# Patient Record
Sex: Female | Born: 1945
Health system: Southern US, Community
[De-identification: ages and names within clinical notes are randomized; demographics above are authoritative.]

## PROBLEM LIST (undated history)

## (undated) DIAGNOSIS — I2510 Atherosclerotic heart disease of native coronary artery without angina pectoris: Secondary | ICD-10-CM

## (undated) DIAGNOSIS — I214 Non-ST elevation (NSTEMI) myocardial infarction: Secondary | ICD-10-CM

## (undated) DIAGNOSIS — M1990 Unspecified osteoarthritis, unspecified site: Secondary | ICD-10-CM

## (undated) DIAGNOSIS — Z955 Presence of coronary angioplasty implant and graft: Secondary | ICD-10-CM

## (undated) DIAGNOSIS — F419 Anxiety disorder, unspecified: Secondary | ICD-10-CM

## (undated) DIAGNOSIS — Z9289 Personal history of other medical treatment: Secondary | ICD-10-CM

## (undated) DIAGNOSIS — M940 Chondrocostal junction syndrome [Tietze]: Secondary | ICD-10-CM

## (undated) DIAGNOSIS — R030 Elevated blood-pressure reading, without diagnosis of hypertension: Secondary | ICD-10-CM

## (undated) DIAGNOSIS — Z9861 Coronary angioplasty status: Secondary | ICD-10-CM

## (undated) DIAGNOSIS — E785 Hyperlipidemia, unspecified: Secondary | ICD-10-CM

## (undated) DIAGNOSIS — Z85038 Personal history of other malignant neoplasm of large intestine: Secondary | ICD-10-CM

## (undated) HISTORY — DX: Personal history of other malignant neoplasm of large intestine: Z85.038

## (undated) HISTORY — DX: Anxiety disorder, unspecified: F41.9

## (undated) HISTORY — DX: Coronary angioplasty status: Z98.61

## (undated) HISTORY — DX: Hyperlipidemia, unspecified: E78.5

## (undated) HISTORY — DX: Personal history of other medical treatment: Z92.89

## (undated) HISTORY — DX: Atherosclerotic heart disease of native coronary artery without angina pectoris: I25.10

## (undated) HISTORY — DX: Elevated blood-pressure reading, without diagnosis of hypertension: R03.0

## (undated) HISTORY — DX: Unspecified osteoarthritis, unspecified site: M19.90

## (undated) HISTORY — DX: Presence of coronary angioplasty implant and graft: Z95.5

## (undated) HISTORY — DX: Non-ST elevation (NSTEMI) myocardial infarction: I21.4

## (undated) HISTORY — DX: Chondrocostal junction syndrome (tietze): M94.0

---

## 2000-12-20 ENCOUNTER — Encounter: Payer: Self-pay | Admitting: Emergency Medicine

## 2000-12-20 ENCOUNTER — Emergency Department (HOSPITAL_COMMUNITY): Admission: EM | Admit: 2000-12-20 | Discharge: 2000-12-20 | Payer: Self-pay | Admitting: Emergency Medicine

## 2000-12-22 ENCOUNTER — Emergency Department (HOSPITAL_COMMUNITY): Admission: EM | Admit: 2000-12-22 | Discharge: 2000-12-22 | Payer: Self-pay | Admitting: *Deleted

## 2000-12-25 ENCOUNTER — Emergency Department (HOSPITAL_COMMUNITY): Admission: EM | Admit: 2000-12-25 | Discharge: 2000-12-25 | Payer: Self-pay | Admitting: Emergency Medicine

## 2000-12-25 ENCOUNTER — Encounter: Payer: Self-pay | Admitting: Emergency Medicine

## 2002-09-18 ENCOUNTER — Encounter: Payer: Self-pay | Admitting: Internal Medicine

## 2002-09-18 ENCOUNTER — Ambulatory Visit (HOSPITAL_COMMUNITY): Admission: RE | Admit: 2002-09-18 | Discharge: 2002-09-18 | Payer: Self-pay | Admitting: Internal Medicine

## 2002-09-22 ENCOUNTER — Ambulatory Visit (HOSPITAL_COMMUNITY): Admission: RE | Admit: 2002-09-22 | Discharge: 2002-09-22 | Payer: Self-pay | Admitting: Internal Medicine

## 2002-09-22 ENCOUNTER — Encounter: Payer: Self-pay | Admitting: Internal Medicine

## 2002-09-28 HISTORY — PX: CHOLECYSTECTOMY: SHX55

## 2002-10-13 ENCOUNTER — Encounter: Payer: Self-pay | Admitting: General Surgery

## 2002-10-17 ENCOUNTER — Ambulatory Visit (HOSPITAL_COMMUNITY): Admission: RE | Admit: 2002-10-17 | Discharge: 2002-10-18 | Payer: Self-pay | Admitting: General Surgery

## 2002-10-17 ENCOUNTER — Encounter (INDEPENDENT_AMBULATORY_CARE_PROVIDER_SITE_OTHER): Payer: Self-pay | Admitting: *Deleted

## 2002-10-17 ENCOUNTER — Encounter: Payer: Self-pay | Admitting: General Surgery

## 2004-04-07 ENCOUNTER — Emergency Department (HOSPITAL_COMMUNITY): Admission: EM | Admit: 2004-04-07 | Discharge: 2004-04-07 | Payer: Self-pay | Admitting: Emergency Medicine

## 2004-05-21 ENCOUNTER — Ambulatory Visit (HOSPITAL_COMMUNITY): Admission: RE | Admit: 2004-05-21 | Discharge: 2004-05-21 | Payer: Self-pay | Admitting: Internal Medicine

## 2004-05-27 ENCOUNTER — Ambulatory Visit (HOSPITAL_COMMUNITY): Admission: RE | Admit: 2004-05-27 | Discharge: 2004-05-27 | Payer: Self-pay | Admitting: Internal Medicine

## 2004-06-23 ENCOUNTER — Emergency Department (HOSPITAL_COMMUNITY): Admission: EM | Admit: 2004-06-23 | Discharge: 2004-06-23 | Payer: Self-pay | Admitting: Emergency Medicine

## 2004-11-21 ENCOUNTER — Ambulatory Visit: Payer: Self-pay | Admitting: Internal Medicine

## 2005-08-25 ENCOUNTER — Encounter: Admission: RE | Admit: 2005-08-25 | Discharge: 2005-08-25 | Payer: Self-pay | Admitting: Family Medicine

## 2005-10-31 IMAGING — CR DG BE W/ CM - WO/W KUB
8 series · 8 of 8 positions shown · non-contrast
Comparison: none

CLINICAL DATA: Completed colonoscopy, but hepatic flexure inadequately evaluated because of stool. 
BARIUM ENEMA 
KUB unremarkable.  There are right upper quadrant surgical clips. 
A full column barium enema was done with palpation.  Special attention was paid to the hepatic flexure.  Numerous spot films were obtained with and without compression, in varying degrees of obliquity.  The hepatic flexure appears normal in all views.  The right colon is redundant.  There is some feces in the tip of the cecum, but no persistent lesions.  No ulcerations or diverticula are noted elsewhere in the colon.  On the postevacuation film, there is some reflux into the terminal ileum which appears normal.  The colonic mucosa looks normal as well.  
IMPRESSION
Normal study ? specifically the hepatic flexure appears normal.

[view not recorded (1 of 8)]
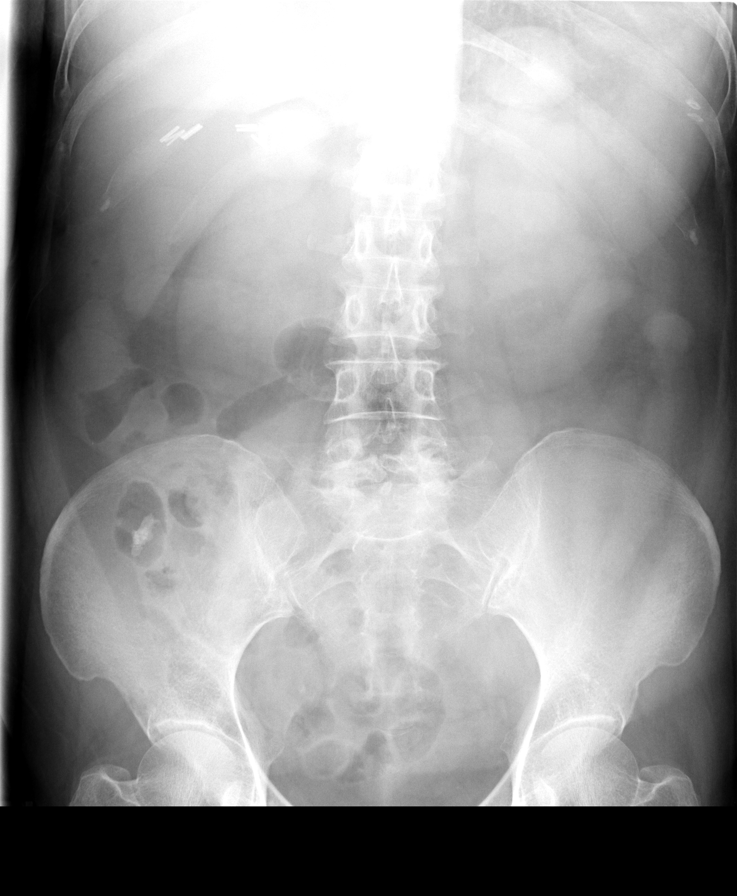

[view not recorded (2 of 8)]
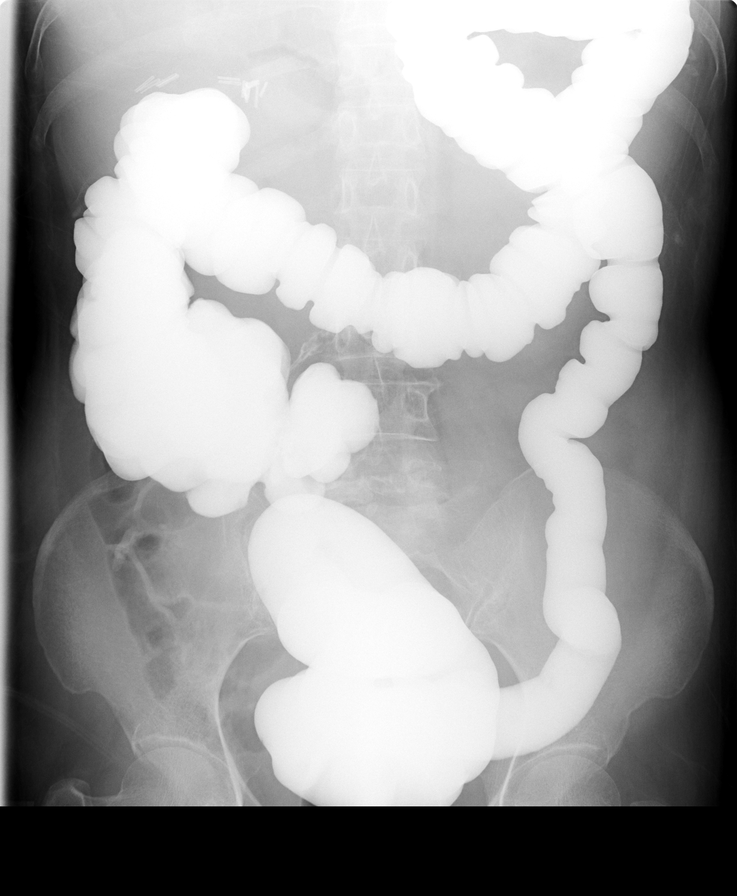

[view not recorded (3 of 8)]
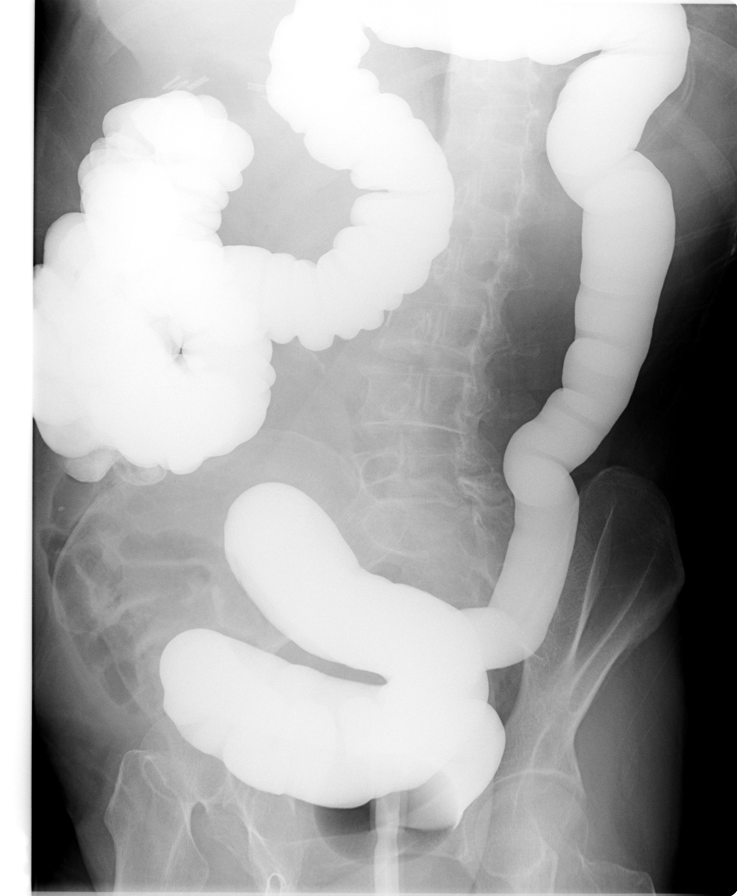

[view not recorded (4 of 8)]
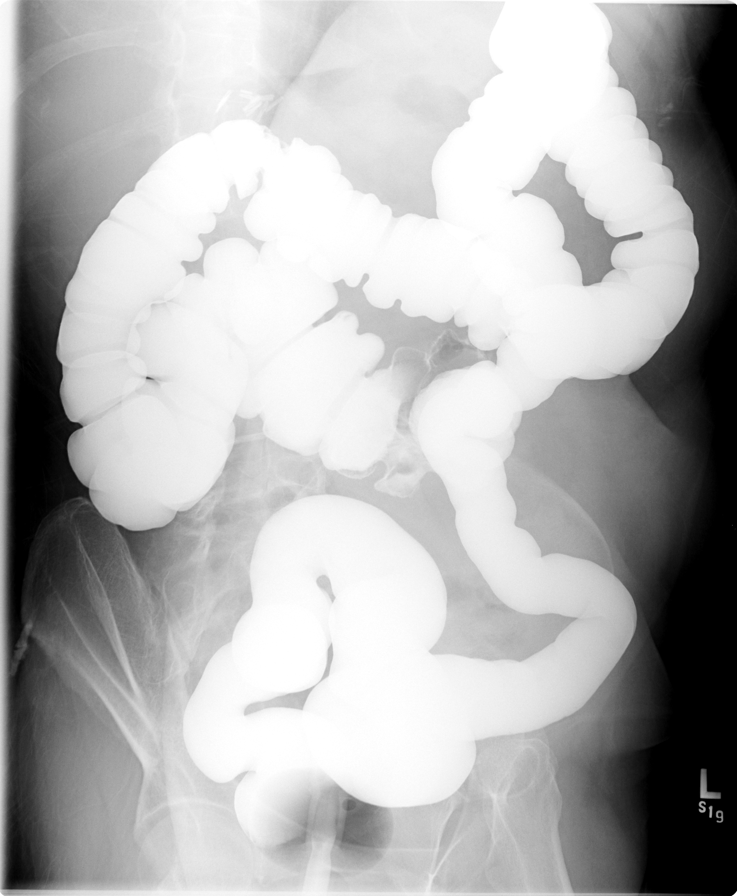

[view not recorded (5 of 8)]
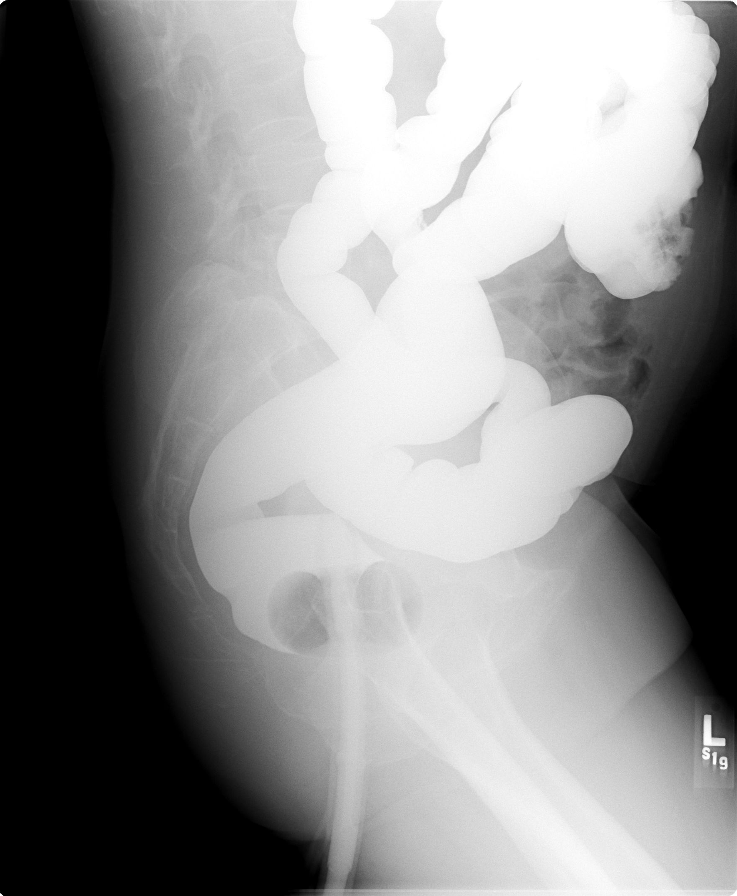

[view not recorded (6 of 8)]
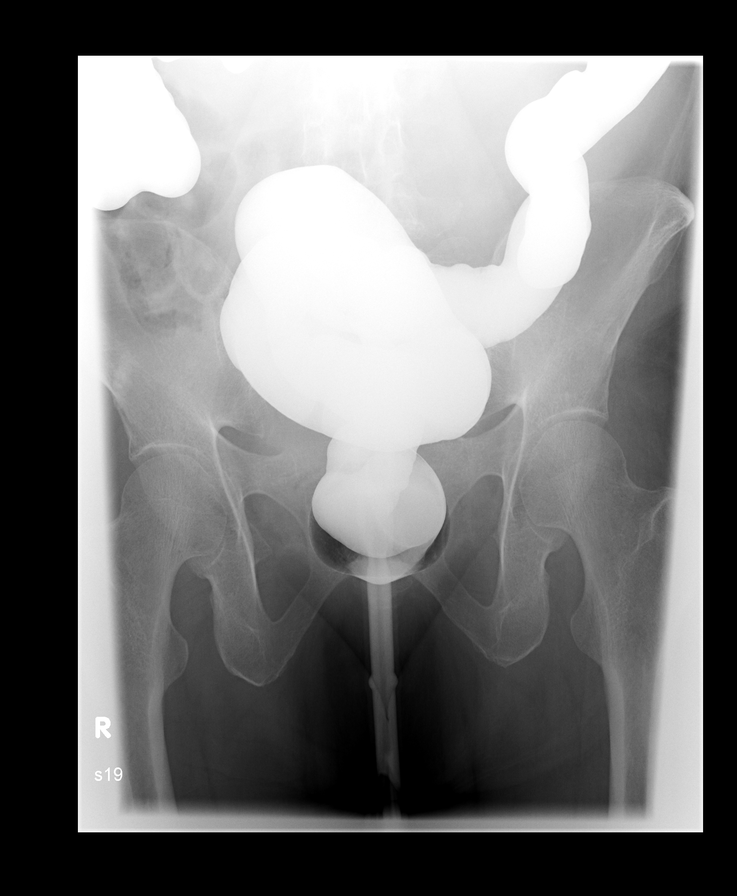

[view not recorded (7 of 8)]
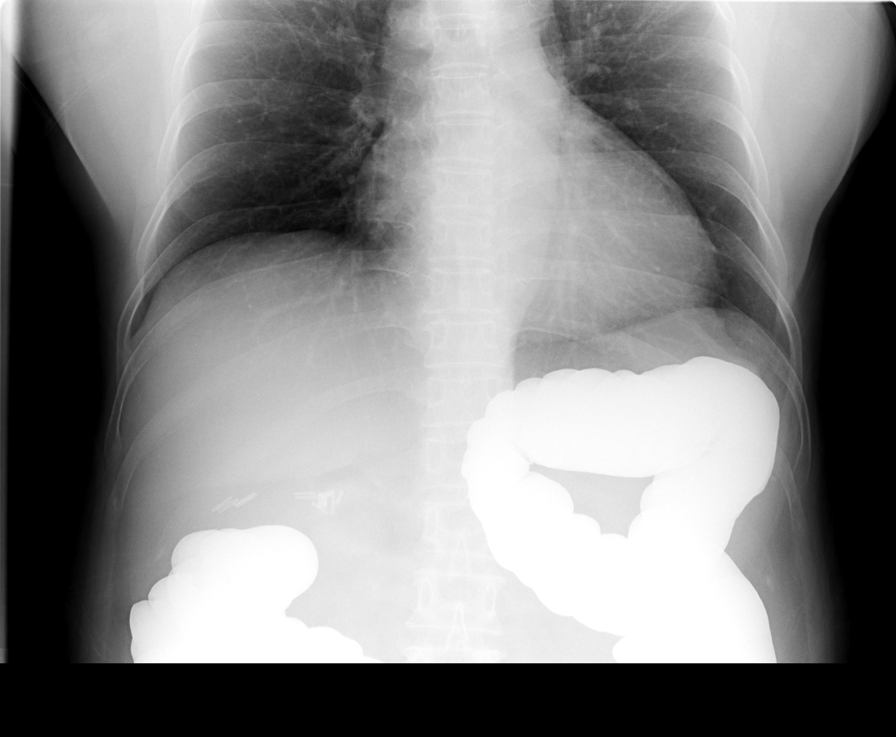

[view not recorded (8 of 8)]
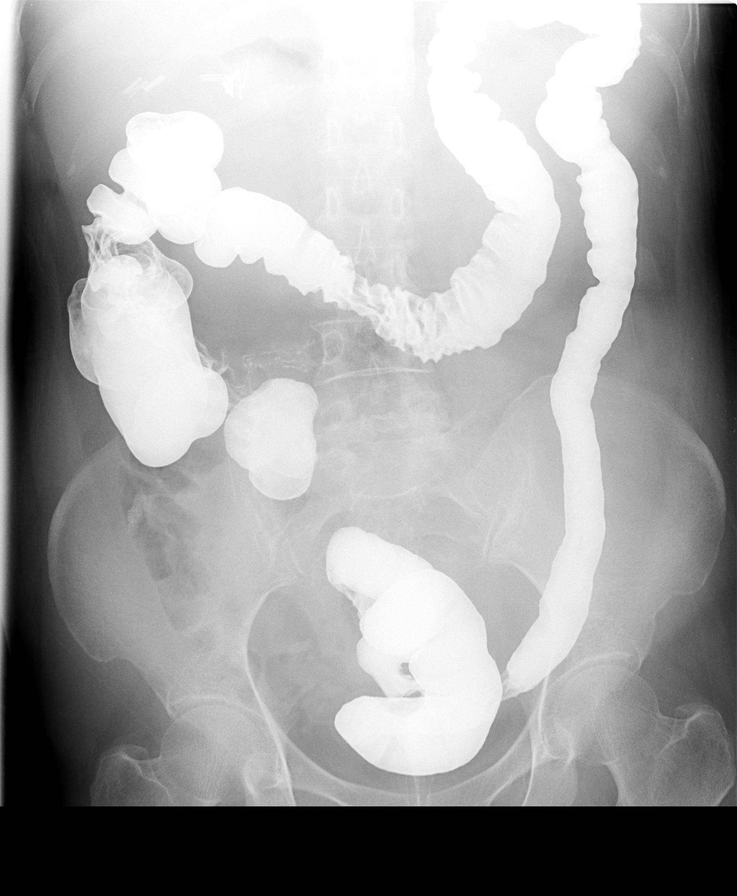

[8 of 8 positions shown; findings below may reference images not displayed]

## 2008-09-28 DIAGNOSIS — Z85038 Personal history of other malignant neoplasm of large intestine: Secondary | ICD-10-CM

## 2008-09-28 HISTORY — DX: Personal history of other malignant neoplasm of large intestine: Z85.038

## 2009-08-02 ENCOUNTER — Encounter: Admission: RE | Admit: 2009-08-02 | Discharge: 2009-08-02 | Payer: Self-pay | Admitting: Internal Medicine

## 2009-08-15 ENCOUNTER — Encounter: Admission: RE | Admit: 2009-08-15 | Discharge: 2009-08-15 | Payer: Self-pay | Admitting: Internal Medicine

## 2010-08-06 ENCOUNTER — Encounter: Admission: RE | Admit: 2010-08-06 | Discharge: 2010-08-06 | Payer: Self-pay | Admitting: Internal Medicine

## 2010-10-19 ENCOUNTER — Encounter: Payer: Self-pay | Admitting: Internal Medicine

## 2010-11-11 ENCOUNTER — Emergency Department (HOSPITAL_COMMUNITY)
Admission: EM | Admit: 2010-11-11 | Discharge: 2010-11-11 | Disposition: A | Discharge status: Home / Self Care | Payer: Medicare Other | Attending: Emergency Medicine | Admitting: Emergency Medicine

## 2010-11-11 ENCOUNTER — Emergency Department (HOSPITAL_COMMUNITY): Payer: Medicare Other

## 2010-11-11 DIAGNOSIS — R0609 Other forms of dyspnea: Secondary | ICD-10-CM | POA: Insufficient documentation

## 2010-11-11 DIAGNOSIS — R0789 Other chest pain: Secondary | ICD-10-CM | POA: Insufficient documentation

## 2010-11-11 DIAGNOSIS — I517 Cardiomegaly: Secondary | ICD-10-CM | POA: Insufficient documentation

## 2010-11-11 DIAGNOSIS — I10 Essential (primary) hypertension: Secondary | ICD-10-CM | POA: Insufficient documentation

## 2010-11-11 DIAGNOSIS — I498 Other specified cardiac arrhythmias: Secondary | ICD-10-CM | POA: Insufficient documentation

## 2010-11-11 DIAGNOSIS — E7800 Pure hypercholesterolemia, unspecified: Secondary | ICD-10-CM | POA: Insufficient documentation

## 2010-11-11 DIAGNOSIS — R0989 Other specified symptoms and signs involving the circulatory and respiratory systems: Secondary | ICD-10-CM | POA: Insufficient documentation

## 2010-11-11 DIAGNOSIS — R609 Edema, unspecified: Secondary | ICD-10-CM | POA: Insufficient documentation

## 2010-11-11 LAB — POCT CARDIAC MARKERS
CKMB, poc: 1.6 ng/mL (ref 1.0–8.0)
Myoglobin, poc: 50.4 ng/mL (ref 12–200)
Troponin i, poc: <0.05 ng/mL (ref 0.00–0.09)

## 2010-11-11 LAB — BASIC METABOLIC PANEL
BUN: 6 mg/dL (ref 6–23)
CO2: 27 mEq/L (ref 19–32)
Calcium: 9.1 mg/dL (ref 8.4–10.5)
Chloride: 109 mEq/L (ref 96–112)
GFR calc Af Amer: >60 mL/min (ref >60)
GFR calc non Af Amer: >60 mL/min (ref >60)
Glucose, Bld: 98 mg/dL (ref 70–99)
Potassium: 3.5 mEq/L (ref 3.5–5.1)
Sodium: 143 mEq/L (ref 135–145)

## 2010-11-11 LAB — CBC
HCT: 35.1 % — ABNORMAL LOW (ref 36.0–46.0)
Hemoglobin: 11.3 g/dL — ABNORMAL LOW (ref 12.0–15.0)
MCH: 21.7 pg — ABNORMAL LOW (ref 26.0–34.0)
MCV: 67.5 fL — ABNORMAL LOW (ref 78.0–100.0)
Platelets: 182 10*3/uL (ref 150–400)
RBC: 5.2 MIL/uL — ABNORMAL HIGH (ref 3.87–5.11)
RDW: 14.5 % (ref 11.5–15.5)
WBC: 9.7 10*3/uL (ref 4.0–10.5)

## 2010-11-11 LAB — DIFFERENTIAL
Basophils Relative: 0 % (ref 0–1)
Eosinophils Relative: 1 % (ref 0–5)
Lymphocytes Relative: 38 % (ref 12–46)
Lymphs Abs: 3.7 10*3/uL (ref 0.7–4.0)
Monocytes Absolute: 0.7 10*3/uL (ref 0.1–1.0)
Monocytes Relative: 7 % (ref 3–12)
Neutro Abs: 5.2 10*3/uL (ref 1.7–7.7)
Neutrophils Relative %: 54 % (ref 43–77)

## 2010-11-11 LAB — BRAIN NATRIURETIC PEPTIDE: Pro B Natriuretic peptide (BNP): 66 pg/mL (ref 0.0–100.0)

## 2011-02-13 NOTE — Op Note (Signed)
NAMEPEYTAN, ANDRINGA [facility]                           ACCOUNT NO.:  [id number]   MEDICAL RECORD NO.:  [id number]                   PATIENT TYPE:  OIB   LOCATION:  2870                                 FACILITY:  [facility]   PHYSICIAN:  Sharlet Salina T. Hoxworth, M.D.          DATE OF BIRTH:  1945-12-04   DATE OF PROCEDURE:  10/17/2002  DATE OF DISCHARGE:                                 OPERATIVE REPORT   PREOPERATIVE DIAGNOSIS:  Cholelithiasis.   POSTOPERATIVE DIAGNOSIS:  Cholelithiasis.   OPERATION PERFORMED:  Laparoscopic cholecystectomy with intraoperative  cholangiogram.   SURGEON:  Sharlet Salina T. Hoxworth, M.D.   ASSISTANT:  Anselm Pancoast. Zachery Dakins, M.D.   ANESTHESIA:  General.   INDICATIONS FOR PROCEDURE:  The patient is a 65 year old [name]  who presents with repeated episodes of right upper quadrant abdominal pain.  She has had a CT scan of the abdomen and abdominal ultrasound, both of which  reveal multiple small gallstones in all bile ducts.  She has a history of  surgery in [place] a number of years ago with a right upper quadrant  incision and she does not know what this was done for.  Laparoscopic  cholecystectomy has been recommended and accepted.  The nature of the  procedure, its indications and risks of bleeding, infection, bile leak, bile  duct injury and possible need for open procedure were discussed and  understood.  She is now brought to the operating room for this procedure.   DESCRIPTION OF PROCEDURE:  The patient was brought to the operating room and  placed in supine position on the operating table and general endotracheal  anesthesia was induced.  She had been given preoperative antibiotics.  The  abdomen was sterilely prepped and draped.  Local anesthesia was used to  infiltrate the trocar sites prior to the incisions.  A 1 cm incision was  made at the umbilicus and dissection carried down to the midline fascia  which was sharply incised for 1 cm and the  peritoneum entered under direct  vision.  Through a mattress suture of 0 Vicryl, a Hasson trocar was placed  and pneumoperitoneum was established.  There were numerous adhesions of  omentum to the anterior abdominal wall in the upper abdomen mainly in the  right upper quadrant.  I placed a 5 mm trocar in the left upper quadrant and  using careful sharp dissection under direct vision, the adhesions were taken  down from the anterior abdominal wall.  The gallbladder was exposed and had  a fair amount of scarring around it.  It was not acutely inflamed.  It was  densely adherent up to the anterior abdominal wall and apparent old drain  site and it appeared that the patient had had a cholecystostomy tube placed.  The gallbladder adhesions were taken down as well as adhesions over the  liver and omental adhesions in the upper abdomen.  At this point we  were  able to place standard 10 mm trocar in the subxiphoid and two 5 mm trocars  in the right upper quadrant.  Fundus of the gallbladder was grasped and  elevated over the liver.  There were adhesions to the duodenum that were  carefully taken down sharply and the duodenum mobilized away from the  infundibulum.  Adhesions and peritoneum were divided along the distal  gallbladder anteriorly and posteriorly and Calot's triangle exposed.  Fibrofatty tissue was stripped off the neck of the gallbladder and what  appeared to be the cystic duct was exposed.  Operative cholangiogram was  then obtained through this which showed a long remnant of the cystic duct  which emptied into a normal common bile duct with flow up into the  intrahepatic ducts and free flow into the duodenum with no filling defects.  Following this, this structure was dissected out further and encircled and  the cystic artery identified running along next to it and these were both  controlled with clips and divided.  The gallbladder was then dissected free  from its bed using hook  cautery.  There was a lot of scarring but the  dissection proceeded nicely.  The posterior artery was controlled with  clips.  This was well up in the gallbladder fossa.  Final attachments were  divided and the gallbladder removed through the umbilicus.  The right upper  quadrant was irrigated and complete hemostasis assured.  Trocars were  removed under direct vision and all CO2 evacuated from the peritoneal  cavity.  Mattress suture was secured at the umbilicus.  Skin incisions were  closed with interrupted subcuticular 4-0 Monocryl and Steri-Strips.  Sponge,  needle and instrument counts were correct.  Dry sterile dressings were  applied.  The patient was taken to the recovery room in good condition.                                                Lorne Skeens. Hoxworth, M.D.    Tory Emerald  D:  10/17/2002  T:  10/17/2002  Job:  [id number]

## 2011-05-21 ENCOUNTER — Other Ambulatory Visit: Payer: Self-pay | Admitting: Internal Medicine

## 2011-05-21 DIAGNOSIS — K219 Gastro-esophageal reflux disease without esophagitis: Secondary | ICD-10-CM

## 2011-05-26 ENCOUNTER — Other Ambulatory Visit: Payer: Medicare Other

## 2011-05-27 ENCOUNTER — Other Ambulatory Visit: Payer: Self-pay | Admitting: Internal Medicine

## 2011-05-27 ENCOUNTER — Ambulatory Visit
Admission: RE | Admit: 2011-05-27 | Discharge: 2011-05-27 | Disposition: A | Discharge status: Home / Self Care | Payer: Medicare Other | Source: Ambulatory Visit | Attending: Internal Medicine | Admitting: Internal Medicine

## 2011-05-27 DIAGNOSIS — K219 Gastro-esophageal reflux disease without esophagitis: Secondary | ICD-10-CM

## 2011-12-31 ENCOUNTER — Other Ambulatory Visit: Payer: Self-pay | Admitting: Cardiovascular Disease

## 2011-12-31 ENCOUNTER — Encounter (HOSPITAL_COMMUNITY): Payer: Self-pay | Admitting: Cardiology

## 2011-12-31 DIAGNOSIS — I10 Essential (primary) hypertension: Secondary | ICD-10-CM | POA: Diagnosis not present

## 2011-12-31 DIAGNOSIS — I200 Unstable angina: Secondary | ICD-10-CM | POA: Diagnosis not present

## 2011-12-31 DIAGNOSIS — I214 Non-ST elevation (NSTEMI) myocardial infarction: Secondary | ICD-10-CM

## 2011-12-31 DIAGNOSIS — I2510 Atherosclerotic heart disease of native coronary artery without angina pectoris: Secondary | ICD-10-CM

## 2011-12-31 DIAGNOSIS — E785 Hyperlipidemia, unspecified: Secondary | ICD-10-CM | POA: Diagnosis present

## 2011-12-31 DIAGNOSIS — R030 Elevated blood-pressure reading, without diagnosis of hypertension: Secondary | ICD-10-CM | POA: Diagnosis present

## 2011-12-31 HISTORY — DX: Atherosclerotic heart disease of native coronary artery without angina pectoris: I25.10

## 2011-12-31 HISTORY — DX: Non-ST elevation (NSTEMI) myocardial infarction: I21.4

## 2011-12-31 NOTE — H&P (Signed)
Tammy Murillo is an 66 y.o. female.   Chief Complaint: chest pain HPI: 66 year old [name] presents to the office today with complaints of 1 month of chest pain, usually with exertion, but it does on occasion awaken her from sleep.  The pain may be sharp, but associated with nausea, sob, and diaphoresis.  Mostly occurs when she walks fast.  She has a history of  Negative nuc. Study Jan. 2013. The episodes and the pain have increased in frequency and intensity over the last few weeks.    EKG today reveals new t wave inversions in V4, V5, V6.  She was having chest discomfort on exam that resolved with NTG sl.    Past Medical History  Diagnosis Date  . HTN (hypertension) 12/31/2011  . Dyslipidemia 12/31/2011    History reviewed. No pertinent past surgical history.  History reviewed. No pertinent family history. Mother with CAD Social History:  does not have a smoking history on file. She does not have any smokeless tobacco history on file. Her alcohol and drug histories not on file. married, 2 children  Allergies: Allergies not on file  NKA  No current facility-administered medications on file as of .   No current outpatient prescriptions on file as of .  OUTPATIENT MEDS: Metoprolol 25 mg BID Zetia 10 mg daily Simvastatin 40 mg daily ADDED 12/31/11:  Imdur 15 mg daily, NTG sl, prn, ASA 325 mg daily.  No results found for this or any previous visit (from the past 48 hour(s)). No results found.  ROS: General:no colds or fevers recently   Skin:no rashes or ulcers HEENT:no blurred vision CV:see HPI PUL:SOB    MS:  No joint pain  Neuro:no syncope Endo:no diabetes or thyroid disease GI:no diarrhea or constipation GU: no hematuria  There were no vitals taken for this visit.  BP 120/70 p-65 r- P T-p   Wt. 132 PE: General:alert and oriented X 3, MAE, no acute distress Skin:W&D brisk capillary refill HEENT:normocephalic, sclera clear Neck:supple, no JVD, no carotid  bruits Heart:S1S2 RRR no murmur, gallup, rub or click Lungs:clear without rales, rhonchi or wheezes Abd:+ BS, soft, non tender Ext:no edema Neuro:alert and oriented X 3, MAE, follows commands.       Assessment/Plan   Patient Active Problem List  Diagnoses  . Angina pectoris, crescendo  . HTN (hypertension)  . Dyslipidemia   PLAN:  Imdur, ASA and NTG and Protonix were added to her medications at office visit.  For cardiac cath tomorrow 01/01/12 to evaluate CAD with new EKG changes with progressive chest pain.  If her pain returns before the cath and not relieved by NTG she is to go to ER.  Her Daughter was the interpreter for this exam. We will need [place] interpreter for the cath.  Cameron Katayama R 12/31/2011, 6:55 PM

## 2012-01-01 ENCOUNTER — Other Ambulatory Visit: Payer: Self-pay

## 2012-01-01 ENCOUNTER — Encounter (HOSPITAL_COMMUNITY)
Admission: RE | Disposition: A | Discharge status: Home / Self Care | Payer: Self-pay | Source: Ambulatory Visit | Attending: Cardiovascular Disease

## 2012-01-01 ENCOUNTER — Ambulatory Visit (HOSPITAL_COMMUNITY): Payer: Medicare Other

## 2012-01-01 ENCOUNTER — Inpatient Hospital Stay (HOSPITAL_COMMUNITY)
Admission: RE | Admit: 2012-01-01 | Discharge: 2012-01-03 | DRG: 247 | Disposition: A | Discharge status: Home / Self Care | Payer: Medicare Other | Source: Ambulatory Visit | Attending: Cardiovascular Disease | Admitting: Cardiovascular Disease

## 2012-01-01 ENCOUNTER — Encounter (HOSPITAL_COMMUNITY): Payer: Self-pay | Admitting: Internal Medicine

## 2012-01-01 DIAGNOSIS — R9431 Abnormal electrocardiogram [ECG] [EKG]: Secondary | ICD-10-CM | POA: Diagnosis not present

## 2012-01-01 DIAGNOSIS — I517 Cardiomegaly: Secondary | ICD-10-CM | POA: Diagnosis not present

## 2012-01-01 DIAGNOSIS — I2510 Atherosclerotic heart disease of native coronary artery without angina pectoris: Secondary | ICD-10-CM | POA: Diagnosis not present

## 2012-01-01 DIAGNOSIS — I10 Essential (primary) hypertension: Secondary | ICD-10-CM | POA: Diagnosis not present

## 2012-01-01 DIAGNOSIS — I214 Non-ST elevation (NSTEMI) myocardial infarction: Principal | ICD-10-CM | POA: Diagnosis present

## 2012-01-01 DIAGNOSIS — Z955 Presence of coronary angioplasty implant and graft: Secondary | ICD-10-CM

## 2012-01-01 DIAGNOSIS — Z9861 Coronary angioplasty status: Secondary | ICD-10-CM | POA: Diagnosis present

## 2012-01-01 DIAGNOSIS — E785 Hyperlipidemia, unspecified: Secondary | ICD-10-CM | POA: Diagnosis not present

## 2012-01-01 DIAGNOSIS — I200 Unstable angina: Secondary | ICD-10-CM | POA: Diagnosis not present

## 2012-01-01 DIAGNOSIS — R079 Chest pain, unspecified: Secondary | ICD-10-CM | POA: Diagnosis not present

## 2012-01-01 DIAGNOSIS — R030 Elevated blood-pressure reading, without diagnosis of hypertension: Secondary | ICD-10-CM | POA: Diagnosis present

## 2012-01-01 HISTORY — DX: Presence of coronary angioplasty implant and graft: Z95.5

## 2012-01-01 HISTORY — PX: PERCUTANEOUS CORONARY STENT INTERVENTION (PCI-S): SHX5485

## 2012-01-01 HISTORY — PX: LEFT HEART CATHETERIZATION WITH CORONARY ANGIOGRAM: SHX5451

## 2012-01-01 LAB — CBC
MCV: 66 fL — ABNORMAL LOW (ref 78.0–100.0)
Platelets: 201 10*3/uL (ref 150–400)
RDW: 15 % (ref 11.5–15.5)
WBC: 7.5 10*3/uL (ref 4.0–10.5)

## 2012-01-01 LAB — COMPREHENSIVE METABOLIC PANEL
AST: 32 U/L (ref 0–37)
Albumin: 3.7 g/dL (ref 3.5–5.2)
Calcium: 9.2 mg/dL (ref 8.4–10.5)
Chloride: 106 mEq/L (ref 96–112)
Creatinine, Ser: 0.45 mg/dL — ABNORMAL LOW (ref 0.50–1.10)

## 2012-01-01 LAB — PROTIME-INR: Prothrombin Time: 12.8 seconds (ref 11.6–15.2)

## 2012-01-01 LAB — POCT ACTIVATED CLOTTING TIME: Activated Clotting Time: 463 seconds

## 2012-01-01 LAB — LIPID PANEL
LDL Cholesterol: 154 mg/dL — ABNORMAL HIGH (ref 0–99)
Triglycerides: 147 mg/dL (ref <150)

## 2012-01-01 SURGERY — LEFT HEART CATHETERIZATION WITH CORONARY ANGIOGRAM
Anesthesia: LOCAL

## 2012-01-01 MED ORDER — FENTANYL CITRATE 0.05 MG/ML IJ SOLN
INTRAMUSCULAR | Status: AC
  Filled 2012-01-01: qty 2

## 2012-01-01 MED ORDER — ONDANSETRON HCL 4 MG/2ML IJ SOLN: 4.0000 mg | Freq: Four times a day (QID) | INTRAMUSCULAR | Status: DC | PRN

## 2012-01-01 MED ORDER — TRAMADOL HCL 50 MG PO TABS
50.0000 mg | ORAL_TABLET | Freq: Four times a day (QID) | ORAL | Status: DC | PRN
  Administered 2012-01-01 – 2012-01-02 (×2): 50 mg via ORAL
  Filled 2012-01-01: qty 1

## 2012-01-01 MED ORDER — NITROGLYCERIN 0.4 MG SL SUBL: 0.4000 mg | SUBLINGUAL_TABLET | SUBLINGUAL | Status: DC | PRN

## 2012-01-01 MED ORDER — NITROGLYCERIN 0.4 MG SL SUBL
0.4000 mg | SUBLINGUAL_TABLET | SUBLINGUAL | Status: DC | PRN
  Administered 2012-01-01: 0.4 mg via SUBLINGUAL
  Filled 2012-01-01: qty 25

## 2012-01-01 MED ORDER — SODIUM CHLORIDE 0.9 % IJ SOLN: 3.0000 mL | Freq: Two times a day (BID) | INTRAMUSCULAR | Status: DC

## 2012-01-01 MED ORDER — TICAGRELOR 90 MG PO TABS
ORAL_TABLET | ORAL | Status: AC
  Administered 2012-01-01: 90 mg via ORAL
  Filled 2012-01-01: qty 2

## 2012-01-01 MED ORDER — ASPIRIN 81 MG PO CHEW
324.0000 mg | CHEWABLE_TABLET | ORAL | Status: AC
  Administered 2012-01-01: 324 mg via ORAL
  Filled 2012-01-01: qty 4

## 2012-01-01 MED ORDER — SODIUM CHLORIDE 0.9 % IJ SOLN: 3.0000 mL | INTRAMUSCULAR | Status: DC | PRN

## 2012-01-01 MED ORDER — EZETIMIBE 10 MG PO TABS
10.0000 mg | ORAL_TABLET | Freq: Every day | ORAL | Status: DC
  Administered 2012-01-01 – 2012-01-02 (×2): 10 mg via ORAL
  Filled 2012-01-01 (×3): qty 1

## 2012-01-01 MED ORDER — SIMVASTATIN 40 MG PO TABS
40.0000 mg | ORAL_TABLET | Freq: Every evening | ORAL | Status: DC
  Administered 2012-01-01: 40 mg via ORAL
  Filled 2012-01-01 (×2): qty 1

## 2012-01-01 MED ORDER — LIDOCAINE HCL (PF) 1 % IJ SOLN
INTRAMUSCULAR | Status: AC
  Filled 2012-01-01: qty 30

## 2012-01-01 MED ORDER — SODIUM CHLORIDE 0.9 % IV SOLN
INTRAVENOUS | Status: DC
  Administered 2012-01-01 (×2): via INTRAVENOUS

## 2012-01-01 MED ORDER — NITROGLYCERIN 0.2 MG/ML ON CALL CATH LAB
INTRAVENOUS | Status: AC
  Filled 2012-01-01: qty 1

## 2012-01-01 MED ORDER — NITROGLYCERIN 0.4 MG SL SUBL
SUBLINGUAL_TABLET | SUBLINGUAL | Status: AC
  Filled 2012-01-01: qty 25

## 2012-01-01 MED ORDER — ISOSORBIDE MONONITRATE ER 30 MG PO TB24
30.0000 mg | ORAL_TABLET | Freq: Every day | ORAL | Status: DC
  Administered 2012-01-01: 30 mg via ORAL
  Filled 2012-01-01 (×2): qty 1

## 2012-01-01 MED ORDER — DIAZEPAM 5 MG PO TABS
5.0000 mg | ORAL_TABLET | ORAL | Status: AC
  Administered 2012-01-01: 5 mg via ORAL
  Filled 2012-01-01: qty 1

## 2012-01-01 MED ORDER — METOPROLOL TARTRATE 25 MG PO TABS
25.0000 mg | ORAL_TABLET | Freq: Two times a day (BID) | ORAL | Status: DC
  Administered 2012-01-01 – 2012-01-03 (×4): 25 mg via ORAL
  Filled 2012-01-01 (×6): qty 1

## 2012-01-01 MED ORDER — HEPARIN (PORCINE) IN NACL 2-0.9 UNIT/ML-% IJ SOLN
INTRAMUSCULAR | Status: AC
  Filled 2012-01-01: qty 2000

## 2012-01-01 MED ORDER — ACETAMINOPHEN 325 MG PO TABS
650.0000 mg | ORAL_TABLET | ORAL | Status: DC | PRN
  Administered 2012-01-02 (×2): 650 mg via ORAL
  Filled 2012-01-01 (×2): qty 2

## 2012-01-01 MED ORDER — MIDAZOLAM HCL 2 MG/2ML IJ SOLN
INTRAMUSCULAR | Status: AC
  Filled 2012-01-01: qty 2

## 2012-01-01 MED ORDER — PANTOPRAZOLE SODIUM 40 MG PO TBEC
40.0000 mg | DELAYED_RELEASE_TABLET | Freq: Every day | ORAL | Status: DC
  Administered 2012-01-01 – 2012-01-02 (×2): 40 mg via ORAL
  Filled 2012-01-01 (×2): qty 1

## 2012-01-01 MED ORDER — ASPIRIN EC 81 MG PO TBEC
81.0000 mg | DELAYED_RELEASE_TABLET | Freq: Every day | ORAL | Status: DC
  Administered 2012-01-02: 81 mg via ORAL
  Filled 2012-01-01 (×2): qty 1

## 2012-01-01 MED ORDER — SODIUM CHLORIDE 0.9 % IV SOLN: INTRAVENOUS | Status: DC

## 2012-01-01 MED ORDER — SODIUM CHLORIDE 0.9 % IV SOLN: 250.0000 mL | INTRAVENOUS | Status: DC | PRN

## 2012-01-01 MED ORDER — TICAGRELOR 90 MG PO TABS
90.0000 mg | ORAL_TABLET | Freq: Two times a day (BID) | ORAL | Status: DC
  Administered 2012-01-01 – 2012-01-02 (×3): 90 mg via ORAL
  Filled 2012-01-01 (×6): qty 1

## 2012-01-01 MED ORDER — BIVALIRUDIN 250 MG IV SOLR
INTRAVENOUS | Status: AC
  Filled 2012-01-01: qty 250

## 2012-01-01 NOTE — Op Note (Signed)
[facility]     CARDIAC CATHETERIZATION REPORT  Tammy Murillo   [id number] Sep 25, 1946  Performing Cardiologist: Chrystie Nose Primary Physician: No primary provider on file. Primary Cardiologist:  Dr. Herbie Baltimore  Procedures Performed:  Left Heart Catheterization via 5 Fr right femoral artery access  Left Ventriculography, (RAO/LAO) 15 ml/sec for 28 ml total contrast  Native Coronary Angiography  Indication(s): Unstable angina, abnormal EKG suggestive of ischemia  History: 66 y.o. female who is [place] with a history of dyslipidemia, hypertension, colon CA in 2010 and osteoarthritis. She has been having chest pain on an off for several years and had a stress test in 09/2010 which showed normal perfusion and an EF of 75%. This was exercise with 5 mets achieved. Yesterday, she presented to the office with typical anginal pain which was increasing in severity and TWI's concerning for ischemia. This morning she had an episode of chest pain (5/10), substernal, in the short stay area which improved with nitroglycerin. She is referred for urgent cardiac catheterization.  Consent: The procedure with Risks/Benefits/Alternatives and Indications was reviewed with the patient (and family), with a [place] translator present.  All questions were answered.    Risks / Complications include, but not limited to: Death, MI, CVA/TIA, VF/VT (with defibrillation), Bradycardia (need for temporary pacer placement), contrast induced nephropath, bleeding / bruising / hematoma / pseudoaneurysm, vascular or coronary injury (with possible emergent CT or Vascular Surgery), adverse medication reactions, infection.    The patient (and family) voice understanding and agree to proceed.    Risks of procedure as well as the alternatives and risks of each were explained to the (patient/caregiver).  Consent for procedure obtained. Consent for signed by MD and patient with RN witness -- placed on  chart.  Procedure: The patient was brought to the 2nd Floor [facility] in the fasting state and prepped and draped in the usual sterile fashion for (Right groin) access.   Sterile technique was used including antiseptics, cap, gloves, gown, hand hygiene, mask and sheet.  Skin prep: Chlorhexidine;  Time Out: Verified patient identification, verified procedure, site/side was marked, verified correct patient position, special equipment/implants available, medications/allergies/relevent history reviewed, required imaging and test results available.  Performed  The right femoral head was identified using tactile and fluoroscopic technique.  The right groin was anesthetized with 1% subcutaneous Lidocaine.  The right Common Femoral Artery was accessed using the Modified Seldinger Technique with placement of a antimicrobial bonded/coated single lumen (5 Fr) sheath was placed using the Seldinger technique.  The sheath was aspirated and flushed.  A 5 Fr JL4 Catheter was advanced of over a  Standard J wire into the ascending Aorta.  The catheter was used to engage the left coronary artery.  Multiple cineangiographic views of the left coronary artery system(s) were performed. A 5 Fr JR4 Catheter was advanced of over a Safety J wire into the ascending Aorta.  The catheter was used to engage the right coronary artery.  Multiple cineangiographic views of the right coronary artery system was performed. This catheter was then exchanged over the standard J wire for an angled Pigtail catheter that was advanced across the Aortic Valve.  LV hemodynamics were measured and Left Ventriculography was performed.  LV hemodynamics were then re-sampled, and the catheter was pulled back across the Aortic Valve for measurement of "pull-back" gradient.  The catheter was removed completely out of the body.  The sheath was then exchanged over the safety J wire  for a 75F sheath to allow percutaneous  intervention.  Please see Dr. Landry Dyke note for PCI details.  Medications:  Premedication: 5 mg  Valium  Sedation:  1 mg IV Versed, 25 IV mcg Fentanyl  Contrast:  55 cc Omnipaque   Hemodynamics:  Central Aortic Pressure / Mean Aortic Pressure: 121/64  LV Pressure / LV End diastolic Pressure:  8  Left Ventriculography:  EF: 55-60%  Wall Motion: No focal wall motion abnormalities  Coronary Angiographic Data:  Left Main:  No angiographically significant stenosis  Left Anterior Descending (LAD):  There is a 99% proximal (technically mid) LAD stenosis after a high diagonal branch.  There is a sequential 60% lesion which appears eccentric approximately 20 mm distal to the 1st lesion. The LAD coarses around the apex.  1st diagonal (D1):  Large vessel with high LAD takeoff, however after the crux. There is no angiographic stenosis.  2nd diagonal (D2):  Small vessel, distal takeoff, no significant disease.  Circumflex (LCx):  No angiographic stenoses  1st obtuse marginal:  Large branch without significant diseasse  2nd obtuse marginal:  Large branch without significant disease   Right Coronary Artery: Dominant. There is a distal bifurcation of the PLB/PDA  right ventricle branch of right coronary artery: Small caliber, tortuous vessel  posterior descending artery: Larger branch without significant stenosis  posterior lateral branch:  Small branch, no significant stenosis  Impression: 1.  Severe proximal LAD stenosis (99%) with a sequential 60-70% stenosis about 20 mm distal to the first lesion - I suspect the entire segment is diseased. 2.  LVEF 55-60% 3.  LVEDP = 8 mmHg  Plan: 1.  Discussed with Dr. Tresa Endo - will plan PCI to the proximal LAD. 2.  Load with Brillinta 180 mg - plan angiomax. 3.  History of colon CA without recurrence. She is likely a good DES candidate.  The case and results was discussed with the patient (and family) through the [place] translator. The  case and results was not discussed with the patient's PCP. The case and results was not discussed with the patient's Cardiologist.  Time Spend Directly with Patient:  60 minutes  Chrystie Nose, MD, [facility] Attending Cardiologist [facility]  Rachell Druckenmiller C 01/01/2012, 1:08 PM

## 2012-01-01 NOTE — Cardiovascular Report (Signed)
NAMECOURTENAY, HIRTH                   ACCOUNT NO.:  [id number]  MEDICAL RECORD NO.:  [id number]  LOCATION:  2504                         FACILITY:  [facility]  PHYSICIAN:  Nicki Guadalajara, M.D.     DATE OF BIRTH:  [birth year]  DATE OF PROCEDURE:  01/01/2012 DATE OF DISCHARGE:                           CARDIAC CATHETERIZATION   INDICATIONS:  Ms. Jorden Mahl is a 66 year old [name] who has a history of hypertension, hyperlipidemia, and remote colon cancer.  In January 2012, a nuclear perfusion study apparently was normal. Recently, the patient has developed episodes of chest pain.  She was seen in the office yesterday and due to worrisome symptoms of angina, she presented for diagnostic cardiac catheterization today.  Diagnostic catheterization was done by Dr. Italy Hilty.  The patient was found to have high-grade proximal to mid LAD segmental stenoses.  She is now referred for percutaneous coronary intervention.  PROCEDURE:  The patient was already in the catheterization laboratory from her diagnostic procedure.  A 6-French sheath had been already placed in her right femoral artery.  The patient received an additional 1 mg of Versed plus 25 mcg of fentanyl.  Angiomax bolus plus infusion was administered.  Oral Brilinta 180 mg was given for antiplatelet therapy.  ACT was documented to be therapeutic.  A 6-French XB LAD 3.5 guide was used.  A Prowater wire was advanced down the LAD and was able to cross multiple lesions into the distal vessel.  The LAD had a proximal diagonal vessel and then had mild narrowing and in the region of the proximal septal artery, it was 95% eccentrically stenosed.  This was followed by another area of 70% narrowing with an eccentric shelf after the second septal perforating artery followed by another area of 60% narrowing immediately thereafter.  In the mid LAD, there was also another area of 50% narrowing.  An initial dilatation was done of the proximal 2  sites using a 2.5 x 20 mm Emerge balloon dilatation catheter. Multiple image views were obtained and it was felt that in order to cover the second lesion, which had an eccentric shelf, the stent would be right near the third lesion and therefore the decision was made to cover the third lesion.  Consequently, a 2.75 x 38 mm PROMUS Element DES- stent was then successfully deployed to cover all 3 proximal to mid lesions.  This was dilated x2 at 12 and 13 atmospheres.  A 3.0 x 20 mm noncompliant Quantum balloon was used for post stent dilatation up to 3.0 mm tapering distally to 2.95 mm.  SCOUT angiography confirmed the excellent angiographic result with all 3 lesions being reduced to 0%. There was no change in the fourth lesion which was not intervened upon and remained 50% narrowed.  There was brisk TIMI-3 flow.  There was no evidence for dissection.  IMPRESSION:  Successful percutaneous coronary artery intervention of the left anterior descending artery involving segmental stenoses of 95% followed by 70% eccentric stenosis with shelf and 60% narrowing with all 3 lesions being covered by a 2.75 x 38 mm PROMUS Element DES-stent, post- dilated to 3.0 mm, tapering to 2.95 mm done with bivalirudin/oral  Brilinta 180 mg, and IC nitroglycerin.  All lesions were reduced to 0%. There was no change in the further mid-LAD lesion of 50%, which was not intervened upon.          ______________________________ Nicki Guadalajara, M.D.     TK/MEDQ  D:  01/01/2012  T:  01/01/2012  Job:  [id number]  cc:   Landry Corporal, MD Italy Hilty, MD Darcella Cheshire, M.D.

## 2012-01-01 NOTE — Brief Op Note (Signed)
01/01/2012  2:40 PM   PCI:  LAD  PATIENT:  Tammy Murillo  66 y.o. female  PRE-OPERATIVE DIAGNOSIS:  unstable angina   PROCEDURE:  Procedure(s) (LRB): LEFT HEART CATHETERIZATION WITH CORONARY ANGIOGRAM (N/A) PERCUTANEOUS CORONARY STENT INTERVENTION (PCI-S) ()  SURGEON:  Surgeon(s) and Role:    * Chrystie Nose, MD cath    * Lennette Bihari, MD - PCI  Full PCI note dictated;  See diagram,  DICTATION # [id number], [id number]  Successful PCI of 3 proximal to mid LAD lesions of  eccentric 95%, 70%, 60% being reduced to 0 with PTCA/stenting with 2.75 x38 mm DES promus Element stent post dilated to 3.0 to 2.95 taper.    Lennette Bihari, MD, [facility] 01/01/2012 2:41 PM

## 2012-01-01 NOTE — H&P (Signed)
Pt. Seen and examined. Agree with the NP/PA-C note as written.  Plan for LHC today.  Chrystie Nose, MD, [facility] Attending Cardiologist [facility]

## 2012-01-01 NOTE — Progress Notes (Signed)
C/O CHEST PAIN AND LAURA INGOLD,NP NOTIFIED AND O2 STARTED AT 2L/MIN VIA NASAL CANNULA AND EKG NOTED AND NTG GIVEN AND LAURA IN TO SEE AND STATES COMPLETE RELIEF FROM CHEST PAIN AFTER NTG

## 2012-01-01 NOTE — Progress Notes (Signed)
Called to Short Stay C due to pt's chest pain.  EKG was stable, 1 Ntg with resolving pain.

## 2012-01-01 NOTE — Interval H&P Note (Signed)
History and Physical Interval Note:  01/01/2012 12:22 PM  Tammy Murillo  has presented today for surgery, with the diagnosis of unstable angina  The various methods of treatment have been discussed with the patient and family. After consideration of risks, benefits and other options for treatment, the patient has consented to  Procedure(s) (LRB): LEFT HEART CATHETERIZATION WITH CORONARY ANGIOGRAM (N/A) as a surgical intervention .  The patients' history has been reviewed, patient examined, no change in status, stable for surgery.  I have reviewed the patients' chart and labs.  Questions were answered to the patient's satisfaction.     Tammy Murillo  The patient was explained in detail the risks and benefits of cardiac catheterization in the office yesterday. Consent was not signed before the patient reached the cath lab today. I again reviewed the risks and benefits with the patient through the translator. She is having continued chest pain and EKG changes which probably represent ischemia.  She is agreeable for cardiac catheterization today.  Tammy Nose, MD, [facility] Attending Cardiologist [facility]

## 2012-01-02 ENCOUNTER — Other Ambulatory Visit: Payer: Self-pay

## 2012-01-02 DIAGNOSIS — E785 Hyperlipidemia, unspecified: Secondary | ICD-10-CM | POA: Diagnosis present

## 2012-01-02 DIAGNOSIS — I2510 Atherosclerotic heart disease of native coronary artery without angina pectoris: Secondary | ICD-10-CM | POA: Diagnosis not present

## 2012-01-02 DIAGNOSIS — R9431 Abnormal electrocardiogram [ECG] [EKG]: Secondary | ICD-10-CM | POA: Diagnosis not present

## 2012-01-02 DIAGNOSIS — I214 Non-ST elevation (NSTEMI) myocardial infarction: Secondary | ICD-10-CM | POA: Diagnosis not present

## 2012-01-02 DIAGNOSIS — I10 Essential (primary) hypertension: Secondary | ICD-10-CM | POA: Diagnosis present

## 2012-01-02 DIAGNOSIS — R079 Chest pain, unspecified: Secondary | ICD-10-CM | POA: Diagnosis not present

## 2012-01-02 DIAGNOSIS — I200 Unstable angina: Secondary | ICD-10-CM | POA: Diagnosis not present

## 2012-01-02 LAB — CARDIAC PANEL(CRET KIN+CKTOT+MB+TROPI)
CK, MB: 7.8 ng/mL (ref 0.3–4.0)
Total CK: 139 U/L (ref 7–177)
Troponin I: 1.9 ng/mL (ref <0.30)

## 2012-01-02 LAB — CBC
Hemoglobin: 10.3 g/dL — ABNORMAL LOW (ref 12.0–15.0)
MCH: 21.2 pg — ABNORMAL LOW (ref 26.0–34.0)
MCHC: 32.2 g/dL (ref 30.0–36.0)
Platelets: 217 10*3/uL (ref 150–400)
RBC: 4.85 MIL/uL (ref 3.87–5.11)

## 2012-01-02 LAB — BASIC METABOLIC PANEL
CO2: 24 mEq/L (ref 19–32)
Calcium: 8.4 mg/dL (ref 8.4–10.5)
GFR calc non Af Amer: >90 mL/min (ref >90)
Potassium: 3.8 mEq/L (ref 3.5–5.1)
Sodium: 140 mEq/L (ref 135–145)

## 2012-01-02 MED ORDER — RAMIPRIL 1.25 MG PO CAPS: 1.2500 mg | ORAL_CAPSULE | Freq: Every day | ORAL | Status: DC

## 2012-01-02 MED ORDER — ISOSORBIDE MONONITRATE 15 MG HALF TABLET
15.0000 mg | ORAL_TABLET | Freq: Every day | ORAL | Status: DC
  Filled 2012-01-02: qty 1

## 2012-01-02 MED ORDER — ACETAMINOPHEN 325 MG PO TABS: 650.0000 mg | ORAL_TABLET | ORAL | Status: AC | PRN

## 2012-01-02 MED ORDER — RAMIPRIL 1.25 MG PO CAPS
1.2500 mg | ORAL_CAPSULE | Freq: Every day | ORAL | Status: DC
  Administered 2012-01-02 – 2012-01-03 (×2): 1.25 mg via ORAL
  Filled 2012-01-02 (×2): qty 1

## 2012-01-02 MED ORDER — TICAGRELOR 90 MG PO TABS: 90.0000 mg | ORAL_TABLET | Freq: Two times a day (BID) | ORAL | Status: DC

## 2012-01-02 MED ORDER — ASPIRIN 81 MG PO TBEC: 81.0000 mg | DELAYED_RELEASE_TABLET | Freq: Every day | ORAL | Status: AC

## 2012-01-02 MED ORDER — ISOSORBIDE MONONITRATE ER 30 MG PO TB24: 15.0000 mg | ORAL_TABLET | Freq: Every day | ORAL | Status: DC

## 2012-01-02 MED ORDER — ATORVASTATIN CALCIUM 40 MG PO TABS: 40.0000 mg | ORAL_TABLET | Freq: Every day | ORAL | Status: DC

## 2012-01-02 MED ORDER — ATORVASTATIN CALCIUM 40 MG PO TABS
40.0000 mg | ORAL_TABLET | Freq: Every day | ORAL | Status: DC
  Administered 2012-01-02: 40 mg via ORAL
  Filled 2012-01-02 (×2): qty 1

## 2012-01-02 NOTE — Progress Notes (Signed)
CRITICAL VALUE ALERT  Critical value received:  ckmb 7.8 Trop 1.90  Date of notification:  01/02/12  Time of notification:  1130  Critical value read back:yes  Nurse who received alert:  Donna Christen, RN  MD notified (1st page):  Nada Boozer, NP  Time of first page:  1145  MD notified (2nd page):  Time of second page:  Responding MD:  Nada Boozer, NP  Time MD responded:  [phone]

## 2012-01-02 NOTE — Progress Notes (Signed)
   CARE MANAGEMENT NOTE 01/02/2012  Patient:  [facility]   Account Number:  [id number]  Date Initiated:  01/02/2012  Documentation initiated by:  [facility]  Subjective/Objective Assessment:   S/P stent to LAD covering 3 lesions     Action/Plan:   Anticipated DC Date:  01/03/2012   Anticipated DC Plan:  HOME/SELF CARE      DC Planning Services  CM consult  Medication Assistance      Choice offered to / List presented to:             Status of service:  In process, will continue to follow Medicare Important Message given?   (If response is "NO", the following Medicare IM given date fields will be blank) Date Medicare IM given:   Date Additional Medicare IM given:    Discharge Disposition:  HOME/SELF CARE  Per UR Regulation:    If discussed at Long Length of Stay Meetings, dates discussed:    Comments:  01/02/2012 1700 Pt provided free trial card, Brilinta. Pt will need a prescription for free 30 day supply of Brilinta. Does not qualify for copay assistance due to Medicare/Medicaid coverage. Pt can pick up meds from [facility] on [address]. Meds are available at pharmacy. Pt's copay will be $3.50 per NCM, Tomi Bamberger RN CM note on 4/5 to MD. Isidoro Donning RN CCM Case Mgmt phone [phone]

## 2012-01-02 NOTE — Progress Notes (Signed)
CARDIAC REHAB PHASE I   PRE:  Rate/Rhythm: 65 SR  BP:  Supine: 101/43  Sitting:   Standing:    SaO2:   MODE:  Ambulation: 680 ft   POST:  Rate/Rhythem: 79 SR  BP:  Supine:   Sitting: 109/55   Standing:    SaO2:  0945-1130 Tolerated ambulation well without c/o of cp or SOB. VS stable Completed discharge education with pt,daaghter and interpreter from telephonic interpreting [phone]. Pt and daughter voices understanding. Pt declines Outpt. CRP due to no transportation.  Beatrix Fetters

## 2012-01-02 NOTE — Progress Notes (Addendum)
66 year old [name] presents to the office 12/31/11 with complaints of 1 month of chest pain, usually with exertion, but it does on occasion awaken her from sleep. The pain may be sharp, but associated with nausea, sob, and diaphoresis. Mostly occurs when she walks fast. She has a history of Negative nuc. Study Jan. 2013. The episodes and the pain have increased in frequency and intensity over the last few weeks.  EKG 12/31/11 revealed new t wave inversions in V4, V5, V6. She was having chest discomfort on exam that resolved with NTG sl. When she arrived to short stay also with chest pain resolved with NTG sl.    Subjective: No complaints  Objective: Vital signs in last 24 hours: Temp:  [97.7 F (36.5 C)-99.3 F (37.4 C)] 97.9 F (36.6 C) (04/06 0800) Pulse Rate:  [55-78] 67  (04/06 0800) Resp:  [10-18] 18  (04/06 0800) BP: (98-148)/(55-89) 98/57 mmHg (04/06 0800) SpO2:  [97 %-100 %] 97 % (04/06 0800) Weight:  [59.875 kg (132 lb)-63.9 kg (140 lb 14 oz)] 63.9 kg (140 lb 14 oz) (04/06 0536) Weight change:  Last BM Date: 12/29/11 Intake/Output from previous day: +2057 04/05 0701 - 04/06 0700 In: 2457.5 [P.O.:660; I.V.:1797.5] Out: 400 [Urine:400] Intake/Output this shift:    PE: General: No Chest pain No JVD Heart: RR Lungs:no wheezing Abd:BS+ Ecchymosis R groin; no hematoma Ext:no edema Neuro:nonfocal   Lab Results:  Basename 01/02/12 0500 01/01/12 1025  WBC 9.3 7.5  HGB 10.3* 11.6*  HCT 32.0* 35.3*  PLT 217 201   BMET  Basename 01/02/12 0500 01/01/12 1025  NA 140 142  K 3.8 3.6  CL 108 106  CO2 24 26  GLUCOSE 113* 100*  BUN 12 15  CREATININE 0.47* 0.45*  CALCIUM 8.4 9.2   No results found for this basename: TROPONINI:2,CK,MB:2 in the last 72 hours  Lab Results  Component Value Date   CHOL 222* 01/01/2012   HDL 39* 01/01/2012   LDLCALC 154* 01/01/2012   TRIG 147 01/01/2012   CHOLHDL 5.7 01/01/2012   No results found for this basename: HGBA1C     Lab  Results  Component Value Date   TSH 2.844 01/01/2012    Hepatic Function Panel  Basename 01/01/12 1025  PROT 7.1  ALBUMIN 3.7  AST 32  ALT 32  ALKPHOS 63  BILITOT 0.3  BILIDIR --  IBILI --    Basename 01/01/12 1025  CHOL 222*   No results found for this basename: PROTIME in the last 72 hours    EKG: Orders placed during the hospital encounter of 01/01/12  . EKG 12-LEAD  . EKG 12-LEAD    Studies/Results: Dg Chest Port 1 View  01/01/2012  *RADIOLOGY REPORT*  Clinical Data: Chest pain.  PORTABLE CHEST - 1 VIEW  Comparison: 11/11/2010  Findings: Mild cardiomegaly with slight tortuosity of the thoracic aorta.  Pulmonary vascularity is normal.  Slight chronic peribronchial thickening. Lungs are otherwise clear.  No acute osseous abnormality.  IMPRESSION: Chronic peribronchial thickening.  Chronic cardiomegaly.  No acute abnormalities.  Original Report Authenticated By: Gwynn Burly, M.D.    Medications: I have reviewed the patient's current medications.    Marland Kitchen aspirin  324 mg Oral Pre-Cath  . aspirin EC  81 mg Oral Daily  . bivalirudin      . diazepam  5 mg Oral On Call  . ezetimibe  10 mg Oral Daily  . fentaNYL      . heparin      .  isosorbide mononitrate  30 mg Oral Daily  . lidocaine      . metoprolol tartrate  25 mg Oral BID  . midazolam      . nitroGLYCERIN      . nitroGLYCERIN      . pantoprazole  40 mg Oral Daily  . simvastatin  40 mg Oral QPM  . Ticagrelor  90 mg Oral BID  . DISCONTD: sodium chloride  3 mL Intravenous Q12H   Assessment/Plan: Patient Active Problem List  Diagnoses  . Angina pectoris, crescendo  . HTN (hypertension)  . Dyslipidemia  . CAD (coronary artery disease)  . S/P coronary artery stent placement to the LAD  01/01/12, with Promus DES, X 3 in the LAD    PLAN: ambulate, ? Discharge today. Lipids on current meds not controlling lipids was on zocor and zetia.  BP soft.  Low grade fever at 2300 ? Continue IMDUR added at office visit for  angina  EKG this am with t wave inversions in inf leads.  LOS: 1 day   INGOLD,LAURA R 01/02/2012, 8:58 AM   Patient seen and examined. Agree with assessment and plan. No recurrent chest pain. S/P stent to LAD covering 3 lesions. Will add low dose ACE-I. Change Zocor to lipitor 40 mg.  With T wave changes inferiorly, check enzymes.   Lennette Bihari, MD, [facility] 01/02/2012 9:34 AM

## 2012-01-03 ENCOUNTER — Encounter (HOSPITAL_COMMUNITY): Payer: Self-pay | Admitting: Cardiology

## 2012-01-03 DIAGNOSIS — I214 Non-ST elevation (NSTEMI) myocardial infarction: Secondary | ICD-10-CM | POA: Diagnosis present

## 2012-01-03 LAB — BASIC METABOLIC PANEL
Calcium: 9.5 mg/dL (ref 8.4–10.5)
GFR calc non Af Amer: >90 mL/min (ref >90)
Glucose, Bld: 117 mg/dL — ABNORMAL HIGH (ref 70–99)
Potassium: 3.5 mEq/L (ref 3.5–5.1)
Sodium: 141 mEq/L (ref 135–145)

## 2012-01-03 LAB — TROPONIN I: Troponin I: 0.89 ng/mL (ref <0.30)

## 2012-01-03 MED ORDER — POTASSIUM CHLORIDE CRYS ER 20 MEQ PO TBCR
20.0000 meq | EXTENDED_RELEASE_TABLET | Freq: Once | ORAL | Status: AC
  Administered 2012-01-03: 20 meq via ORAL
  Filled 2012-01-03: qty 1

## 2012-01-03 NOTE — Progress Notes (Signed)
Pt. Discharged 01/03/2012  9:56 AM Discharge instructions reviewed with patient/family. Patient/family verbalized understanding. All Rx's given. Questions answered as needed. Pt. Discharged to home with family/self.  Tammy Murillo

## 2012-01-03 NOTE — Progress Notes (Signed)
Subjective:  Yesterday discharge held for positive cardiac enzymes.  May have been elevated prior to procedure with her angina.  Objective: Vital signs in last 24 hours: Temp:  [98 F (36.7 C)-98.7 F (37.1 C)] 98.1 F (36.7 C) (04/07 0424) Pulse Rate:  [52-64] 59  (04/07 0424) Resp:  [18-19] 18  (04/07 0424) BP: (102-123)/(54-65) 121/54 mmHg (04/07 0424) SpO2:  [95 %-98 %] 96 % (04/07 0424) Weight change:  Last BM Date: 01/02/12 Intake/Output from previous day: +360 04/06 0701 - 04/07 0700 In: 360 [P.O.:360] Out: -  Intake/Output this shift:    PE: General:alert, no chest pain-her pre hospital symptoms are resolved,, Daughter assisted with interpreting. Heart:S1S2 RRR Lungs:clear without rales rhonchi or wheezes Abd:+ BS, soft, non tender Ext:no edema--+ecchymosis at cath site, no hematoma    Lab Results:  Basename 01/02/12 0500 01/01/12 1025  WBC 9.3 7.5  HGB 10.3* 11.6*  HCT 32.0* 35.3*  PLT 217 201   BMET  Basename 01/03/12 0620 01/02/12 0500  NA 141 140  K 3.5 3.8  CL 104 108  CO2 26 24  GLUCOSE 117* 113*  BUN 13 12  CREATININE 0.49* 0.47*  CALCIUM 9.5 8.4    Basename 01/03/12 0620 01/02/12 1947  TROPONINI 0.89* 1.31*    Lab Results  Component Value Date   CHOL 222* 01/01/2012   HDL 39* 01/01/2012   LDLCALC 154* 01/01/2012   TRIG 147 01/01/2012   CHOLHDL 5.7 01/01/2012   No results found for this basename: HGBA1C     Lab Results  Component Value Date   TSH 2.844 01/01/2012    Hepatic Function Panel  Basename 01/01/12 1025  PROT 7.1  ALBUMIN 3.7  AST 32  ALT 32  ALKPHOS 63  BILITOT 0.3  BILIDIR --  IBILI --    Basename 01/01/12 1025  CHOL 222*   No results found for this basename: PROTIME in the last 72 hours    EKG: Orders placed during the hospital encounter of 01/01/12  . EKG 12-LEAD  . EKG 12-LEAD    Studies/Results: Dg Chest Port 1 View  01/01/2012  *RADIOLOGY REPORT*  Clinical Data: Chest pain.  PORTABLE CHEST - 1 VIEW   Comparison: 11/11/2010  Findings: Mild cardiomegaly with slight tortuosity of the thoracic aorta.  Pulmonary vascularity is normal.  Slight chronic peribronchial thickening. Lungs are otherwise clear.  No acute osseous abnormality.  IMPRESSION: Chronic peribronchial thickening.  Chronic cardiomegaly.  No acute abnormalities.  Original Report Authenticated By: Gwynn Burly, M.D.    Medications: I have reviewed the patient's current medications.    Marland Kitchen aspirin EC  81 mg Oral Daily  . atorvastatin  40 mg Oral q1800  . ezetimibe  10 mg Oral Daily  . isosorbide mononitrate  15 mg Oral Daily  . metoprolol tartrate  25 mg Oral BID  . pantoprazole  40 mg Oral Daily  . ramipril  1.25 mg Oral Daily  . Ticagrelor  90 mg Oral BID  . DISCONTD: isosorbide mononitrate  30 mg Oral Daily  . DISCONTD: simvastatin  40 mg Oral QPM    Assessment/Plan: Patient Active Problem List  Diagnoses  . Angina pectoris, crescendo  . HTN (hypertension)  . Dyslipidemia  . CAD (coronary artery disease)  . S/P coronary artery stent placement to the LAD  01/01/12, with Promus DES, X 3 in the LAD    PLAN: ? Discharge today?   We adjusted statin, continued Imdur.  ? NSTEMI  LOS: 2 days  INGOLD,LAURA R 01/03/2012, 9:09 AM   I have seen and examined the patient along with [facility] R, NP.  I have reviewed the chart, notes and new data.  I agree with NP's note.  Key new complaints: angina completely resolved, even when walking fast Key examination changes: small access site ecchymosis Key new findings / data: cTnI already falling, suggesting injury occurred pre-procedure.  PLAN: DC home w ASA, Brilinta (discussed critical importance of uninterrupted dual antiplatelet Rx), betablocker, statin and nitrates. May not tolerate ACEi due to low BP. Avoid NSAIDs or use very sparingly. NSTEMI present on admission probably.  Thurmon Fair, MD, [facility] [phone] 01/03/2012, 9:45  AM

## 2012-01-03 NOTE — Discharge Instructions (Signed)
Groin Site Care Refer to this sheet in the next few weeks. These instructions provide you with information on caring for yourself after your procedure. Your caregiver may also give you more specific instructions. Your treatment has been planned according to current medical practices, but problems sometimes occur. Call your caregiver if you have any problems or questions after your procedure. HOME CARE INSTRUCTIONS  You may shower 24 hours after the procedure. Remove the bandage (dressing) and gently wash the site with plain soap and water. Gently pat the site dry.   Do not apply powder or lotion to the site.   Do not sit in a bathtub, swimming pool, or whirlpool for 5 to 7 days.   No bending, squatting, or lifting anything over 10 pounds (4.5 kg) as directed by your caregiver.   Inspect the site at least twice daily.   Do not drive home if you are discharged the same day of the procedure. Have someone else drive you.   You may drive 24 hours after the procedure unless otherwise instructed by your caregiver.  What to expect:  Any bruising will usually fade within 1 to 2 weeks.   Blood that collects in the tissue (hematoma) may be painful to the touch. It should usually decrease in size and tenderness within 1 to 2 weeks.  SEEK IMMEDIATE MEDICAL CARE IF:  You have unusual pain at the groin site or down the affected leg.   You have redness, warmth, swelling, or pain at the groin site.   You have drainage (other than a small amount of blood on the dressing).   You have chills.   You have a fever or persistent symptoms for more than 72 hours.   You have a fever and your symptoms suddenly get worse.   Your leg becomes pale, cool, tingly, or numb.   You have heavy bleeding from the site. Hold pressure on the site.  Document Released: 10/17/2010 Document Revised: 09/03/2011 Document Reviewed: 10/17/2010 [facility] Patient Information 2012 [place], [place].  Call [facility] if any bleeding, swelling or drainage at cath site.  May shower, no tub baths for 48 hours for groin sticks.   Take 1 NTG, under your tongue, while sitting.  If no relief of pain may repeat NTG, one tab every 5 minutes up to 3 tablets total over 15 minutes.  If no relief CALL 911.  If you have dizziness/lightheadness  while taking NTG, stop taking and call 911.        Heart healthy diet  No lifting for 3 days  No driving for 3 days  Call the office if chest pain occurs. Brilinta is a very important medication DO NOT STOP

## 2012-01-03 NOTE — Discharge Summary (Signed)
Physician Discharge Summary  Patient ID: Tammy Murillo MRN: [id number] DOB/AGE: [birth year] 66 y.o.  Admit date: 01/01/2012 Discharge date: 01/03/2012  Discharge Diagnoses:  Active Problems:  Angina pectoris, crescendo  Dyslipidemia  CAD (coronary artery disease)  S/P coronary artery stent placement to the LAD  01/01/12, with Promus DES, X 3 in the LAD   NSTEMI (non-ST elevated myocardial infarction)present on admit.  HTN (hypertension)   Discharged Condition: good  Procedures:01/01/2012 [place] left heart cath by Dr. Rennis Golden 01/01/2012 PCI with proximal to mid LAD lesions and placement of drug-eluting Promus stent By Dr. Rachelle Hora Course: 66 year old Tammy Murillo presents to the office 12/31/11 with complaints of 1 month of chest pain, usually with exertion, but it does on occasion awaken her from sleep. The pain may be sharp, but associated with nausea, sob, and diaphoresis. Mostly occurs when she walks fast. She has a history of Negative nuc. Study Jan. 2013. The episodes and the pain have increased in frequency and intensity over the last few weeks.  EKG today reveals new t wave inversions in V4, V5, V6. She was having chest discomfort on exam that resolved with NTG sl.   Patient was started on Imdur and Protonix and given nitroglycerin sublingual prescription and plans were made for cardiac catheterization the next day. After arrival to the short stay unit she again had recurrent chest pain resolved with sublingual nitroglycerin EKG was without change.  She underwent cardiac catheterization with results as below Left Ventriculography:  EF: 55-60%  Wall Motion: No focal wall motion abnormalities  Coronary Angiographic Data:  Left Main: No angiographically significant stenosis  Left Anterior Descending (LAD): There is a 99% proximal (technically mid) LAD stenosis after a high diagonal branch. There is a sequential 60% lesion which appears eccentric approximately 20 mm distal to the  1st lesion. The LAD coarses around the apex.  1st diagonal (D1): Large vessel with high LAD takeoff, however after the crux. There is no angiographic stenosis.  2nd diagonal (D2): Small vessel, distal takeoff, no significant disease.  Circumflex (LCx): No angiographic stenoses  1st obtuse marginal: Large branch without significant diseasse  2nd obtuse marginal: Large branch without significant disease  Right Coronary Artery: Dominant. There is a distal bifurcation of the PLB/PDA  right ventricle branch of right coronary artery: Small caliber, tortuous vessel  posterior descending artery: Larger branch without significant stenosis  posterior lateral branch: Small branch, no significant stenosis And then underwent PCI.  Successful PCI of 3 proximal to mid LAD lesions of eccentric 95%, 70%, 60% being reduced to 0 with PTCA/stenting with 2.75 x38 mm DES promus Element stent post dilated to 3.0 to 2.95 taper.   The next morning the patient was ambulating without any complaints at all she felt much better. Cardiac enzymes were positive with elevated troponin it was felt safer to keep her one more day.  Patient was having non-ST elevation MI on admission with recurrent chest pain.  By 01/03/2012 she was ambulating she had no complaints and was ready for discharge. She was seen and evaluated by Dr. Royann Shivers felt she was stable.  She received 30 day free card for Brilinta and will followup as an outpatient with Dr. Tresa Endo.  Patient speaks minimal English her daughter translated for her several times for that procedure she had a translator brought in which assisted [place] with the procedure.  During the hospitalization we adjusted her statin as her LDL was elevated on admission despite being on medications as  an outpatient.  Consults: None  Significant Diagnostic Studies:  At discharge sodium 141 potassium 3.5 chloride 104 CO2 26 BUN 13 creatinine 0.49 calcium 9.5 glucose 117. She was given 20 of potassium  prior to discharge by mouth.  Cardiac enzymes troponin peak of 1.90 with declined thereafter at discharge 0.89. Peak CK 139 peak MB 7.8  Total cholesterol 222 triglycerides 147 HDL 39 LDL 154.  Hemoglobin 10.3 hematocrit 32 WBC 9.3 platelet 217 TSH 2.844 Portable chest x-ray on admission:   IMPRESSION:  Chronic peribronchial thickening. Chronic cardiomegaly. No acute  abnormalities  Discharge Exam: Blood pressure 121/54, pulse 59, temperature 98.1 F (36.7 C), temperature source Oral, resp. rate 18, height 4\' 11"  (1.499 m), weight 63.9 kg (140 lb 14 oz), SpO2 96.00%.   General:alert, no chest pain-her pre hospital symptoms are resolved,, Daughter assisted with interpreting.  Heart:S1S2 RRR  Lungs:clear without rales rhonchi or wheezes  Abd:+ BS, soft, non tender  Ext:no edema--+ecchymosis at cath site, no hematoma  Disposition: 01-Home or Self Care   Medication List  As of 01/03/2012  4:18 PM   STOP taking these medications         diclofenac 75 MG EC tablet      diclofenac sodium 1 % Gel      simvastatin 40 MG tablet         TAKE these medications         acetaminophen 325 MG tablet   Commonly known as: TYLENOL   Take 2 tablets (650 mg total) by mouth every 4 (four) hours as needed.      aspirin 81 MG EC tablet   Take 1 tablet (81 mg total) by mouth daily.      atorvastatin 40 MG tablet   Commonly known as: LIPITOR   Take 1 tablet (40 mg total) by mouth daily at 6 PM.      esomeprazole 40 MG capsule   Commonly known as: NEXIUM   Take 40 mg by mouth daily before breakfast.      ezetimibe 10 MG tablet   Commonly known as: ZETIA   Take 10 mg by mouth daily.      isosorbide mononitrate 30 MG 24 hr tablet   Commonly known as: IMDUR   Take 0.5 tablets (15 mg total) by mouth daily.      metoprolol tartrate 25 MG tablet   Commonly known as: LOPRESSOR   Take 25 mg by mouth 2 (two) times daily.      nitroGLYCERIN 0.4 MG SL tablet   Commonly known as: NITROSTAT     Place 0.4 mg under the tongue every 5 (five) minutes as needed. For chest pain      ramipril 1.25 MG capsule   Commonly known as: ALTACE   Take 1 capsule (1.25 mg total) by mouth daily.      Ticagrelor 90 MG Tabs tablet   Commonly known as: BRILINTA   Take 1 tablet (90 mg total) by mouth 2 (two) times daily.      traMADol 50 MG tablet   Commonly known as: ULTRAM   Take 50 mg by mouth every 6 (six) hours as needed. For pain           Follow-up Information    Follow up with HARDING,DAVID W, MD. (Our office will call you with date and time of appointment)    Contact information:   [facility] [address] [place] [address] [phone]  Discharge instructions: Call [facility] if any bleeding, swelling or drainage at cath site.  May shower, no tub baths for 48 hours for groin sticks.   Take 1 NTG, under your tongue, while sitting.  If no relief of pain may repeat NTG, one tab every 5 minutes up to 3 tablets total over 15 minutes.  If no relief CALL 911.  If you have dizziness/lightheadness  while taking NTG, stop taking and call 911.        Heart healthy diet  No lifting for 3 days  No driving for 3 days  Call the office if chest pain occurs. Brilinta is a very important medication DO NOT STOP  Signed: Anitta Tenny R 01/03/2012, 4:18 PM

## 2012-01-13 DIAGNOSIS — I10 Essential (primary) hypertension: Secondary | ICD-10-CM | POA: Diagnosis not present

## 2012-01-13 DIAGNOSIS — I2510 Atherosclerotic heart disease of native coronary artery without angina pectoris: Secondary | ICD-10-CM | POA: Diagnosis not present

## 2012-01-13 DIAGNOSIS — E782 Mixed hyperlipidemia: Secondary | ICD-10-CM | POA: Diagnosis not present

## 2012-03-14 DIAGNOSIS — Z9861 Coronary angioplasty status: Secondary | ICD-10-CM | POA: Diagnosis not present

## 2012-03-14 DIAGNOSIS — I2510 Atherosclerotic heart disease of native coronary artery without angina pectoris: Secondary | ICD-10-CM | POA: Diagnosis not present

## 2012-03-14 DIAGNOSIS — E782 Mixed hyperlipidemia: Secondary | ICD-10-CM | POA: Diagnosis not present

## 2012-03-28 DIAGNOSIS — Z79899 Other long term (current) drug therapy: Secondary | ICD-10-CM | POA: Diagnosis not present

## 2012-03-28 DIAGNOSIS — E782 Mixed hyperlipidemia: Secondary | ICD-10-CM | POA: Diagnosis not present

## 2012-06-07 DIAGNOSIS — E782 Mixed hyperlipidemia: Secondary | ICD-10-CM | POA: Diagnosis not present

## 2012-06-07 DIAGNOSIS — Z9861 Coronary angioplasty status: Secondary | ICD-10-CM | POA: Diagnosis not present

## 2012-06-07 DIAGNOSIS — I2510 Atherosclerotic heart disease of native coronary artery without angina pectoris: Secondary | ICD-10-CM | POA: Diagnosis not present

## 2012-12-06 DIAGNOSIS — I10 Essential (primary) hypertension: Secondary | ICD-10-CM | POA: Diagnosis not present

## 2012-12-06 DIAGNOSIS — E782 Mixed hyperlipidemia: Secondary | ICD-10-CM | POA: Diagnosis not present

## 2012-12-06 DIAGNOSIS — Z79899 Other long term (current) drug therapy: Secondary | ICD-10-CM | POA: Diagnosis not present

## 2012-12-06 DIAGNOSIS — Z9861 Coronary angioplasty status: Secondary | ICD-10-CM | POA: Diagnosis not present

## 2012-12-27 DIAGNOSIS — Z9289 Personal history of other medical treatment: Secondary | ICD-10-CM

## 2012-12-27 HISTORY — DX: Personal history of other medical treatment: Z92.89

## 2012-12-27 HISTORY — PX: TRANSTHORACIC ECHOCARDIOGRAM: SHX275

## 2013-01-24 ENCOUNTER — Ambulatory Visit (HOSPITAL_COMMUNITY)
Admission: RE | Admit: 2013-01-24 | Discharge: 2013-01-24 | Disposition: A | Discharge status: Home / Self Care | Payer: Medicare Other | Source: Ambulatory Visit | Attending: Cardiology | Admitting: Cardiology

## 2013-01-24 ENCOUNTER — Other Ambulatory Visit (HOSPITAL_COMMUNITY): Payer: Self-pay | Admitting: Cardiology

## 2013-01-24 DIAGNOSIS — I219 Acute myocardial infarction, unspecified: Secondary | ICD-10-CM | POA: Insufficient documentation

## 2013-01-24 DIAGNOSIS — I2510 Atherosclerotic heart disease of native coronary artery without angina pectoris: Secondary | ICD-10-CM | POA: Diagnosis not present

## 2013-01-24 DIAGNOSIS — I252 Old myocardial infarction: Secondary | ICD-10-CM | POA: Diagnosis not present

## 2013-01-24 NOTE — Progress Notes (Signed)
2D Echo Performed 01/24/2013    Dominico Rod, RCS  

## 2013-02-14 ENCOUNTER — Telehealth: Payer: Self-pay | Admitting: *Deleted

## 2013-02-14 NOTE — Telephone Encounter (Signed)
Called no answer.   Result of echocardiogram mild abnormalities per Dr Herbie Baltimore  Looks good f/u appointment in 6 months already in Epic.

## 2013-02-23 NOTE — Telephone Encounter (Signed)
Call no answer 

## 2013-02-27 ENCOUNTER — Other Ambulatory Visit (HOSPITAL_COMMUNITY): Payer: Self-pay | Admitting: Cardiology

## 2013-02-28 NOTE — Telephone Encounter (Signed)
Sent refill

## 2013-03-01 DIAGNOSIS — E039 Hypothyroidism, unspecified: Secondary | ICD-10-CM | POA: Diagnosis not present

## 2013-03-01 DIAGNOSIS — M25579 Pain in unspecified ankle and joints of unspecified foot: Secondary | ICD-10-CM | POA: Diagnosis not present

## 2013-03-01 DIAGNOSIS — J3089 Other allergic rhinitis: Secondary | ICD-10-CM | POA: Diagnosis not present

## 2013-03-01 DIAGNOSIS — D649 Anemia, unspecified: Secondary | ICD-10-CM | POA: Diagnosis not present

## 2013-03-01 DIAGNOSIS — J029 Acute pharyngitis, unspecified: Secondary | ICD-10-CM | POA: Diagnosis not present

## 2013-03-01 DIAGNOSIS — Q245 Malformation of coronary vessels: Secondary | ICD-10-CM | POA: Diagnosis not present

## 2013-03-14 ENCOUNTER — Other Ambulatory Visit: Payer: Self-pay | Admitting: *Deleted

## 2013-03-14 MED ORDER — ISOSORBIDE MONONITRATE ER 30 MG PO TB24: 15.0000 mg | ORAL_TABLET | Freq: Every day | ORAL | Status: DC

## 2013-04-14 ENCOUNTER — Encounter: Payer: Self-pay | Admitting: *Deleted

## 2013-04-14 ENCOUNTER — Telehealth: Payer: Self-pay | Admitting: *Deleted

## 2013-04-14 NOTE — Telephone Encounter (Signed)
Sent letter.Concerning echo results

## 2013-05-23 ENCOUNTER — Encounter: Payer: Self-pay | Admitting: Cardiology

## 2013-05-26 ENCOUNTER — Encounter: Payer: Self-pay | Admitting: Cardiology

## 2013-05-29 HISTORY — PX: NM MYOVIEW LTD: HXRAD82

## 2013-05-30 ENCOUNTER — Encounter: Payer: Self-pay | Admitting: Cardiology

## 2013-05-30 ENCOUNTER — Ambulatory Visit (INDEPENDENT_AMBULATORY_CARE_PROVIDER_SITE_OTHER): Payer: Medicare Other | Admitting: Cardiology

## 2013-05-30 VITALS — BP 112/82 | HR 53 | Ht 60.0 in | Wt 132.7 lb

## 2013-05-30 DIAGNOSIS — E785 Hyperlipidemia, unspecified: Secondary | ICD-10-CM

## 2013-05-30 DIAGNOSIS — R030 Elevated blood-pressure reading, without diagnosis of hypertension: Secondary | ICD-10-CM

## 2013-05-30 DIAGNOSIS — I209 Angina pectoris, unspecified: Secondary | ICD-10-CM | POA: Diagnosis not present

## 2013-05-30 DIAGNOSIS — I2510 Atherosclerotic heart disease of native coronary artery without angina pectoris: Secondary | ICD-10-CM | POA: Diagnosis not present

## 2013-05-30 DIAGNOSIS — Z9861 Coronary angioplasty status: Secondary | ICD-10-CM | POA: Diagnosis not present

## 2013-05-30 DIAGNOSIS — Z955 Presence of coronary angioplasty implant and graft: Secondary | ICD-10-CM

## 2013-05-30 MED ORDER — ISOSORBIDE MONONITRATE ER 30 MG PO TB24: 30.0000 mg | ORAL_TABLET | Freq: Every day | ORAL | Status: DC

## 2013-05-30 NOTE — Patient Instructions (Addendum)
Do not take Ramipril anymore.  Start Imdur (Isorsobide mn 30 mg) take 1 tablet at bedtime.   Your physician has requested that you have a lexiscan myoview. For further information please visit [email or web address] Please follow instruction sheet, as given.  Your physician wants you to follow-up in 6 month Dr Herbie Baltimore- if test is normal. You will receive a reminder letter in the mail two months in advance. If you don't receive a letter, please call our office to schedule the follow-up appointment.

## 2013-05-31 ENCOUNTER — Encounter: Payer: Self-pay | Admitting: Cardiology

## 2013-05-31 DIAGNOSIS — I209 Angina pectoris, unspecified: Secondary | ICD-10-CM | POA: Insufficient documentation

## 2013-05-31 NOTE — Assessment & Plan Note (Addendum)
Am somewhat concerned that she is now having exertional dyspnea and chest discomfort. She has had a history of some atypical sounding symptoms. This is a little bit more concerning. The only thing is unusual is that it doesn't always have it with exertion. I therefore once before with her, unless her non-STEMI lesion was indeed acute and the other 60 and 70% lesions were not flow limiting. And that these episodes are simply exertional, I would say is the least class II if not even class III angina.  Plan: LexiScan Myoview to evaluate for myocardial ischemia.  We'll discontinue her ACE inhibitor, and initiate Imdur 30 mg daily. I don't think she is taking either one of these, unless with her back on the Imdur since her symptoms are alleviated by nitroglycerin. I'll see her back after the stress test.

## 2013-05-31 NOTE — Assessment & Plan Note (Signed)
She is already on aspirin and Brilinta. She seemed to tolerate the beta blocker, which I will continue for the other branch. I am stopping the ACE inhibitor because of her borderline blood pressures and the plan to restart the Imdur. She is also on statin plus Zetia

## 2013-05-31 NOTE — Progress Notes (Signed)
Patient ID: Tammy Murillo, female   DOB: 27-Dec-1945, 67 y.o.   MRN: [id number] PCP: Dorrene German, MD  Clinic Note: Chief Complaint  Patient presents with  . 6 month visit    result echo,  sometime chest pian center of chest,sometime sob,edema ,numbness legs and sometime in hands, she become worried ,angry  chest discomfort   HPI: Tammy Murillo is a 67 y.o. [place]  female with a PMH of non-STEMI with severe LAD lesion treated with a DES stent. This was roughly one year after a totally normal nuclear stress test performed for chest discomfort which sounded much like costochondritis.  She presents today for routine followup, after being last seen in March. She had an echocardiogram performed at that visit which was relatively normal as noted below..  Interval History: Today she sees her doing relatively well overall. She does however that continued to note these chest discomfort episodes. She also gets short of breath with exertion and discomfort in her chest with exertion but is also associated with increased stress or anxiety. When we got a little deeper it does seem that this really only occurs when there is a distress or concerning situation in the home. When things are happy when she is doing fine feeling well with no social stressors, she doesn't seem to have these symptoms. These episodes do not not happen at rest which is something that she had in the past. It is both dyspnea and chest discomfort that she notes that is worse with stress or exertion. She does say these episodes have been alleviated with nitroglycerin. Sometimes she'll go several days without having to use it while others having to use it 2 to 3 times a day.  She denies heart failure symptoms of PND, orthopnea or edema. She denies any melena, hematochezia or hematuria while being on Brilinta. She did not note any syncope or near syncope. No palpitations. No TIA or amaurosis fugax and is in no claudication symptoms. She  denies any orthostatic symptoms of lightheadedness or dizziness.  Past Medical History  Diagnosis Date  . History of: Non-ST elevation MI (NSTEMI) 12/31/2011    Severe LAD lesions: Sequential 90%, 70% and 60%; no other significant stenoses. She had had a normal Myoview in 2012.  Marland Kitchen CAD S/P percutaneous coronary angioplasty 12/31/2011    3 sequential LAD lesions treated with a single Promus Element DES  . Presence of drug coated stent in LAD coronary artery 01/01/2012    Promus Element DES 2.75 mm x38 mm postdilated to 3.0 mm  . H/O echocardiogram April 2014    Normal EF: 55-60%, no regional WMA, grade 1 diastolic dysfunction. Mild MR  . Borderline hypertension   . Dyslipidemia, goal LDL below 70   . History of colon cancer  2010  . Costochondritis   . Anxiety   . Osteoarthritis     Prior Cardiac Evaluation and Past Surgical History: Past Surgical History  Procedure Laterality Date  . Coronary angioplasty with stent placement  01/01/2012    Promus Element DES 2.75 MM by 30 MM, postdilated to 3.0 MM  . Cholecystectomy  2004   No Known Allergies  Current Outpatient Prescriptions  Medication Sig Dispense Refill  . acetaminophen (TYLENOL) 325 MG tablet Take 650 mg by mouth as needed for pain.      Marland Kitchen aspirin EC 81 MG tablet Take 81 mg by mouth daily.      Marland Kitchen atorvastatin (LIPITOR) 40 MG tablet Take 1 tablet (40 mg  total) by mouth daily at 6 PM.  30 tablet  11  . cetirizine (ZYRTEC) 10 MG tablet Take 10 mg by mouth daily.      Marland Kitchen esomeprazole (NEXIUM) 40 MG capsule Take 40 mg by mouth daily before breakfast.      . isosorbide mononitrate (IMDUR) 30 MG 24 hr tablet Take 1 tablet (30 mg total) by mouth daily.  30 tablet  11  . metoprolol tartrate (LOPRESSOR) 25 MG tablet Take 25 mg by mouth 2 (two) times daily.      . nitroGLYCERIN (NITROSTAT) 0.4 MG SL tablet Place 0.4 mg under the tongue every 5 (five) minutes as needed. For chest pain      . Ticagrelor (BRILINTA) 90 MG TABS tablet Take 1  tablet (90 mg total) by mouth 2 (two) times daily.  60 tablet  11  .  ramipril (ALTACE) 1.25 mg   take 1 tablet daily     . ZETIA 10 MG tablet TAKE 1 TABLET BY MOUTH EVERY DAY AT BEDTIME  30 tablet  11   No current facility-administered medications for this visit.    History   Social History Narrative   She is a native [place] woman. She is married with 5 children and 7 grandchildren. One of her daughters or her granddaughters usually comes as an Equities trader. She does not speak any [place].   The granddaughter with her today notes that the patient is significantly prone to anxiety and stress related to family issues. Most of her symptoms are related to stressful situations in house.   She does not smoke or drink. She does not routinely exercise.    ROS: A comprehensive Review of Systems - Negative except Pertinent positives noted above Psychological ROS: positive for - anxiety and Social stressors seem to get better of her  PHYSICAL EXAM BP 112/82  Pulse 53  Ht 5' (1.524 m)  Wt 132 lb 11.2 oz (60.192 kg)  BMI 25.92 kg/m2 General appearance: alert, cooperative, appears stated age, no distress and Does not speak [place]. Answers questions to her granddaughter appropriately. Well-nourished and well-groomed. Neck: no adenopathy, no carotid bruit, no JVD, supple, symmetrical, trachea midline and thyroid not enlarged, symmetric, no tenderness/mass/nodules Lungs: clear to auscultation bilaterally, normal percussion bilaterally and Nonlabored, good air movement Heart: normal apical impulse, regular rate and rhythm, S1, S2 normal, no S3 or S4, systolic murmur: systolic ejection 1/6, crescendo and decrescendo at 2nd right intercostal space, no click and no rub Abdomen: soft, non-tender; bowel sounds normal; no masses,  no organomegaly Extremities: extremities normal, atraumatic, no cyanosis or edema Pulses: 2+ and symmetric Skin: Skin color, texture, turgor normal. No rashes or  lesions Neurologic: Grossly normal  WJX:[id number] today: Yes Rate:53 , Rhythm: Sinus bradycardia, anterior T-wave inversions (V1, V2), stable from previous ECG.  Recent Labs: None recently  ASSESSMENT / PLAN: Angina, class II Am somewhat concerned that she is now having exertional dyspnea and chest discomfort. She has had a history of some atypical sounding symptoms. This is a little bit more concerning. The only thing is unusual is that it doesn't always have it with exertion. I therefore once before with her, unless her non-STEMI lesion was indeed acute and the other 60 and 70% lesions were not flow limiting. And that these episodes are simply exertional, I would say is the least class II if not even class III angina.  Plan: LexiScan Myoview to evaluate for myocardial ischemia.  We'll discontinue her ACE inhibitor, and initiate Imdur 30  mg daily. I don't think she is taking either one of these, unless with her back on the Imdur since her symptoms are alleviated by nitroglycerin. I'll see her back after the stress test.  CAD S/P percutaneous coronary angioplasty - DES to LAD April 2013 She is already on aspirin and Brilinta. She seemed to tolerate the beta blocker, which I will continue for the other branch. I am stopping the ACE inhibitor because of her borderline blood pressures and the plan to restart the Imdur. She is also on statin plus Zetia  Dyslipidemia Continue statin and Zetia. She'll need to have lipids checked which we can do after we get the results of her stress test.  Borderline hypertension Her blood pressure does not really have a lot of room to use the ACE inhibitor. Her ejection fraction had improved. For now I think we can do without the ACE inhibitor at a borderline homeopathic dose.    Orders Placed This Encounter  Procedures  . Myocardial Perfusion Imaging    Class II ANGINA    Standing Status: Future     Number of Occurrences:      Standing Expiration Date:  05/30/2014    Order Specific Question:  Where should this test be performed    Answer:  [facility]    Order Specific Question:  Type of stress    Answer:  Lexiscan    Order Specific Question:  Patient weight in lbs    Answer:  132  . EKG 12-Lead    Scheduling Instructions:     [facility] Specific Question:  Where should this test be performed    Answer:  OTHER   Meds ordered this encounter  Medications  . isosorbide mononitrate (IMDUR) 30 MG 24 hr tablet    Sig: Take 1 tablet (30 mg total) by mouth daily.    Dispense:  30 tablet    Refill:  11   Followup: 6 months, unless stress test is abnormal  Shiva Sahagian W. Herbie [place], M.D., M.S. [facility] [address] [place], [place]  [address]  [phone] Pager # [phone]

## 2013-05-31 NOTE — Assessment & Plan Note (Signed)
Continue statin and Zetia. She'll need to have lipids checked which we can do after we get the results of her stress test.

## 2013-05-31 NOTE — Assessment & Plan Note (Signed)
Her blood pressure does not really have a lot of room to use the ACE inhibitor. Her ejection fraction had improved. For now I think we can do without the ACE inhibitor at a borderline homeopathic dose.

## 2013-06-08 ENCOUNTER — Ambulatory Visit (HOSPITAL_COMMUNITY)
Admission: RE | Admit: 2013-06-08 | Discharge: 2013-06-08 | Disposition: A | Discharge status: Home / Self Care | Payer: Medicare Other | Source: Ambulatory Visit | Attending: Cardiology | Admitting: Cardiology

## 2013-06-08 DIAGNOSIS — I252 Old myocardial infarction: Secondary | ICD-10-CM | POA: Diagnosis not present

## 2013-06-08 DIAGNOSIS — R5381 Other malaise: Secondary | ICD-10-CM | POA: Insufficient documentation

## 2013-06-08 DIAGNOSIS — R0602 Shortness of breath: Secondary | ICD-10-CM | POA: Insufficient documentation

## 2013-06-08 DIAGNOSIS — I10 Essential (primary) hypertension: Secondary | ICD-10-CM | POA: Insufficient documentation

## 2013-06-08 DIAGNOSIS — I209 Angina pectoris, unspecified: Secondary | ICD-10-CM

## 2013-06-08 DIAGNOSIS — R002 Palpitations: Secondary | ICD-10-CM | POA: Diagnosis not present

## 2013-06-08 DIAGNOSIS — I2510 Atherosclerotic heart disease of native coronary artery without angina pectoris: Secondary | ICD-10-CM | POA: Insufficient documentation

## 2013-06-08 DIAGNOSIS — R079 Chest pain, unspecified: Secondary | ICD-10-CM | POA: Diagnosis not present

## 2013-06-08 DIAGNOSIS — E663 Overweight: Secondary | ICD-10-CM | POA: Diagnosis not present

## 2013-06-08 DIAGNOSIS — R42 Dizziness and giddiness: Secondary | ICD-10-CM | POA: Insufficient documentation

## 2013-06-08 DIAGNOSIS — Z955 Presence of coronary angioplasty implant and graft: Secondary | ICD-10-CM

## 2013-06-08 DIAGNOSIS — R55 Syncope and collapse: Secondary | ICD-10-CM | POA: Diagnosis not present

## 2013-06-08 DIAGNOSIS — Z9861 Coronary angioplasty status: Secondary | ICD-10-CM | POA: Diagnosis not present

## 2013-06-08 MED ORDER — TECHNETIUM TC 99M SESTAMIBI GENERIC - CARDIOLITE
28.7000 | Freq: Once | INTRAVENOUS | Status: AC | PRN
  Administered 2013-06-08: 29 via INTRAVENOUS

## 2013-06-08 MED ORDER — AMINOPHYLLINE 25 MG/ML IV SOLN
75.0000 mg | Freq: Once | INTRAVENOUS | Status: AC
  Administered 2013-06-08: 75 mg via INTRAVENOUS

## 2013-06-08 MED ORDER — REGADENOSON 0.4 MG/5ML IV SOLN
0.4000 mg | Freq: Once | INTRAVENOUS | Status: AC
  Administered 2013-06-08: 0.4 mg via INTRAVENOUS

## 2013-06-08 MED ORDER — TECHNETIUM TC 99M SESTAMIBI GENERIC - CARDIOLITE
10.3000 | Freq: Once | INTRAVENOUS | Status: AC | PRN
  Administered 2013-06-08: 10 via INTRAVENOUS

## 2013-06-08 NOTE — Procedures (Addendum)
[facility] [address] [place] [address] [phone]  Cardiology Nuclear Med Study  Tammy Murillo is a 67 y.o. female     MRN : [id number]     DOB: Jun 23, 1946  Procedure Date: 06/08/2013  Nuclear Med Background Indication for Stress Test:  Stent Patency History:  CAD;MI-- 12/2011;STENT/PTCA--12/2011 Cardiac Risk Factors: Hypertension, Lipids and Overweight  Symptoms:  Chest Pain, Dizziness, Fatigue, Light-Headedness, Near Syncope, Palpitations and SOB   Nuclear Pre-Procedure Caffeine/Decaff Intake:  1:00am NPO After: 11AM   IV Site: R Wrist  IV 0.9% NS with Angio Cath:  22g  Chest Size (in):  N/A IV Started by: Emmit Pomfret, RN  Height: 5' (1.524 m)  Cup Size: C  BMI:  Body mass index is 25.78 kg/(m^2). Weight:  132 lb (59.875 kg)   Tech Comments:  N/A    Nuclear Med Study 1 or 2 day study: 1 day  Stress Test Type:  Lexiscan  Order Authorizing Provider:  DAVID HARDING,MD   Resting Radionuclide: Technetium 32m Sestamibi  Resting Radionuclide Dose: 10.3 mCi   Stress Radionuclide:  Technetium 25m Sestamibi  Stress Radionuclide Dose: 28.7 mCi           Stress Protocol Rest HR: 51 Stress HR: 85  Rest BP: 125/72 Stress BP: 139/70  Exercise Time (min): n/a METS: n/a   Predicted Max HR: 152 bpm % Max HR: 55.92 bpm Rate Pressure Product: 21308  Dose of Adenosine (mg):  n/a Dose of Lexiscan: 0.4 mg  Dose of Atropine (mg): n/a Dose of Dobutamine: n/a mcg/kg/min (at max HR)  Stress Test Technologist: Esperanza Sheets, CCT Nuclear Technologist: Gonzella Lex, CNMT   Rest Procedure:  Myocardial perfusion imaging was performed at rest 45 minutes following the intravenous administration of Technetium 5m Sestamibi. Stress Procedure:  The patient received IV Lexiscan 0.4 mg over 15-seconds.  Technetium 57m Sestamibi injected at 30-seconds.  The patient experienced SOB, Flushing and Headache; 75 mg of IV Aminophylline was  administered with resolution of symptoms.  There were no significant changes with Lexiscan.  Quantitative spect images were obtained after a 45 minute delay.  Transient Ischemic Dilatation (Normal <1.22):  1.13 Lung/Heart Ratio (Normal <0.45):  0.30 QGS EDV:  61 ml QGS ESV:  19 ml LV Ejection Fraction: 70%  Signed by      Rest ECG: NSR - Normal EKG  Stress ECG: No significant change from baseline ECG  QPS Raw Data Images:  Normal; no motion artifact; normal heart/lung ratio. Stress Images:  Normal homogeneous uptake in all areas of the myocardium. Rest Images:  Normal homogeneous uptake in all areas of the myocardium. Subtraction (SDS):  No evidence of ischemia.  Impression Exercise Capacity:  Lexiscan with no exercise. BP Response:  Normal blood pressure response. Clinical Symptoms:  No significant symptoms noted. ECG Impression:  No significant ST segment change suggestive of ischemia. Comparison with Prior Nuclear Study: No significant change from previous study  Overall Impression:  Normal stress nuclear study.  LV Wall Motion:  NL LV Function; NL Wall Motion   Runell Gess, MD  06/08/2013 5:40 PM  '

## 2013-06-09 ENCOUNTER — Encounter (HOSPITAL_COMMUNITY): Payer: Medicare Other

## 2013-06-09 ENCOUNTER — Encounter: Payer: Self-pay | Admitting: *Deleted

## 2013-06-09 ENCOUNTER — Telehealth: Payer: Self-pay | Admitting: *Deleted

## 2013-06-09 NOTE — Telephone Encounter (Signed)
Message copied by Tobin Chad on Fri Jun 09, 2013 10:12 AM ------      Message from: [facility], DAVID      Created: Thu Jun 08, 2013 10:33 PM       Good news - no sign of significant blockages.            I suspect that her chest discomfort may well be related to anxiety.            Marykay Lex, MD       ------

## 2013-06-09 NOTE — Telephone Encounter (Signed)
Letter will be sent.  No way to leave a message

## 2013-06-12 IMAGING — CR DG CHEST 1V PORT
1 series · 1 of 1 positions shown · non-contrast
Comparison: 11/11/2010

CLINICAL DATA: Chest pain.

PORTABLE CHEST - 1 VIEW

[view not recorded]
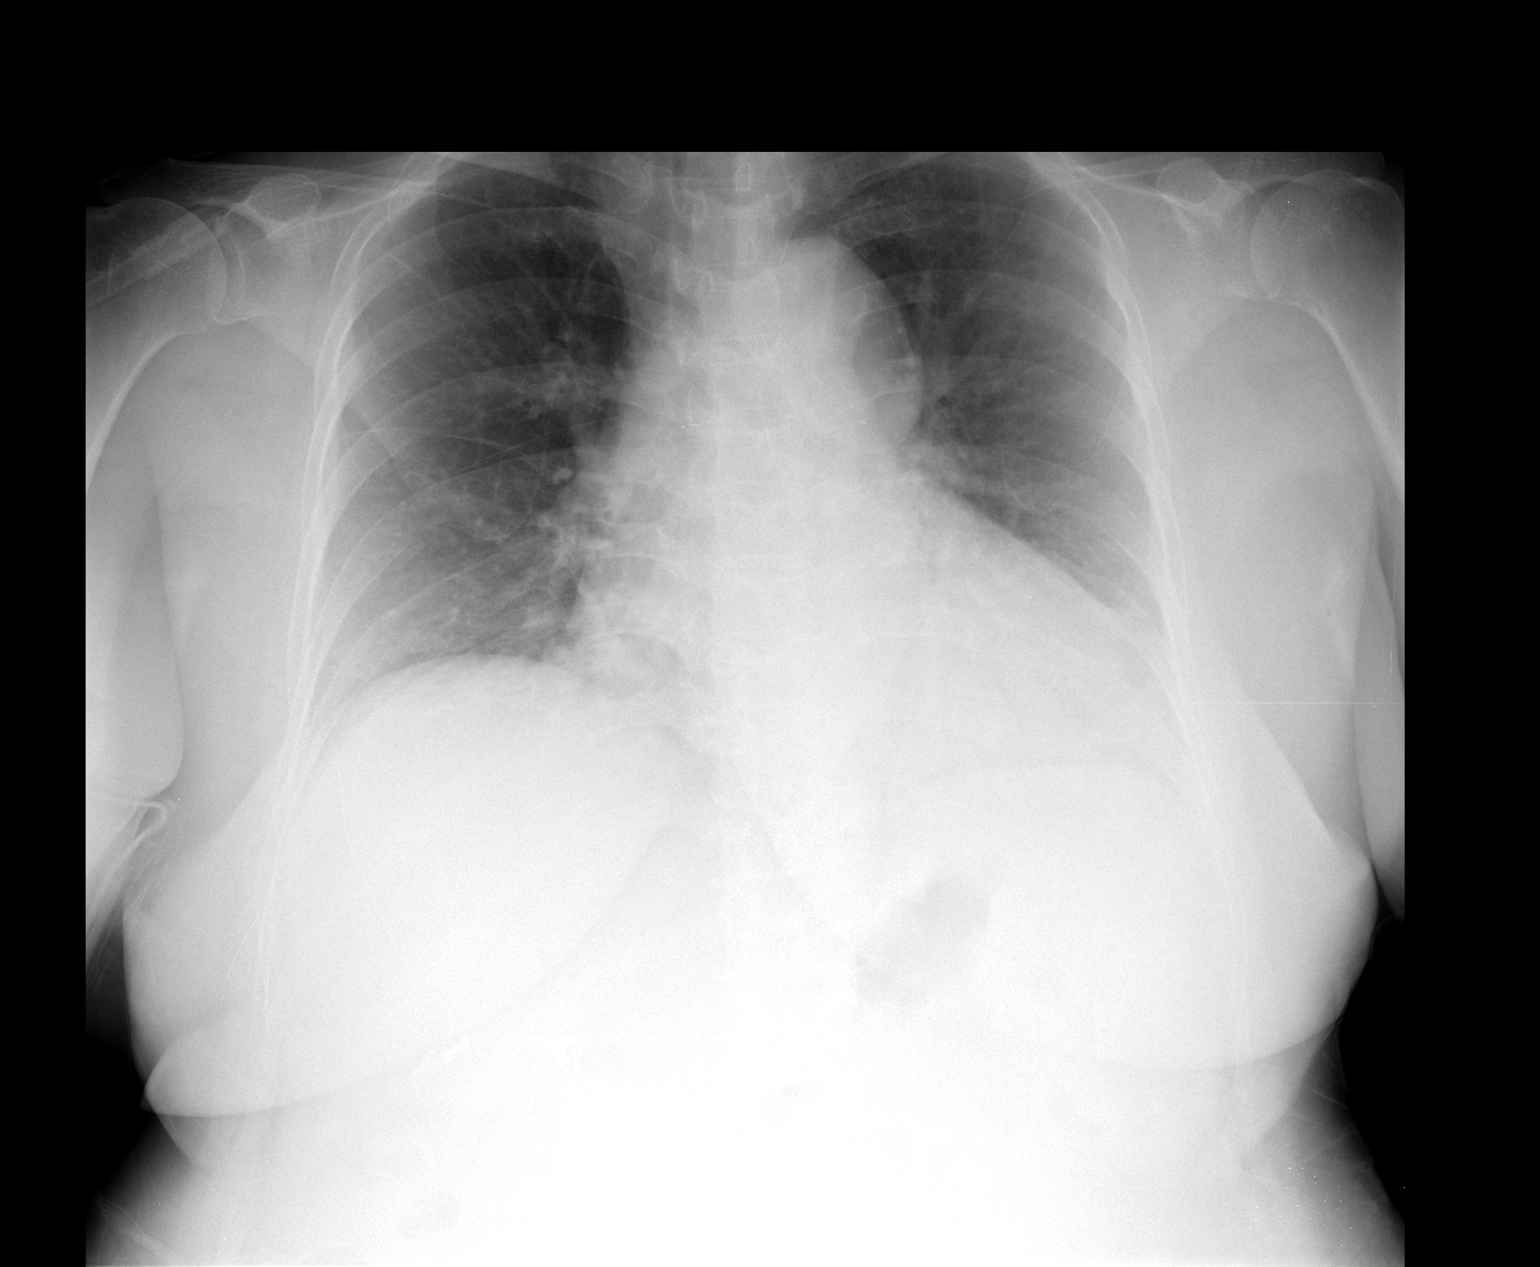

[1 of 1 positions shown; findings below may reference images not displayed]

FINDINGS: Mild cardiomegaly with slight tortuosity of the thoracic
aorta.  Pulmonary vascularity is normal.  Slight chronic
peribronchial thickening. Lungs are otherwise clear.  No acute
osseous abnormality.
IMPRESSION: Chronic peribronchial thickening.  Chronic cardiomegaly.  No acute
abnormalities.

## 2013-06-13 ENCOUNTER — Encounter (HOSPITAL_COMMUNITY): Payer: Medicare Other

## 2013-08-02 ENCOUNTER — Other Ambulatory Visit: Payer: Self-pay | Admitting: Cardiology

## 2013-08-02 NOTE — Telephone Encounter (Signed)
Rx was sent to pharmacy electronically. 

## 2013-11-20 ENCOUNTER — Other Ambulatory Visit (HOSPITAL_COMMUNITY): Payer: Self-pay | Admitting: Cardiology

## 2013-12-04 ENCOUNTER — Ambulatory Visit (INDEPENDENT_AMBULATORY_CARE_PROVIDER_SITE_OTHER): Payer: Medicare Other | Admitting: Cardiology

## 2013-12-04 ENCOUNTER — Encounter: Payer: Self-pay | Admitting: Cardiology

## 2013-12-04 VITALS — BP 124/76 | HR 50 | Ht 60.0 in | Wt 131.4 lb

## 2013-12-04 DIAGNOSIS — Z9861 Coronary angioplasty status: Secondary | ICD-10-CM | POA: Diagnosis not present

## 2013-12-04 DIAGNOSIS — Z79899 Other long term (current) drug therapy: Secondary | ICD-10-CM

## 2013-12-04 DIAGNOSIS — E782 Mixed hyperlipidemia: Secondary | ICD-10-CM | POA: Diagnosis not present

## 2013-12-04 DIAGNOSIS — I214 Non-ST elevation (NSTEMI) myocardial infarction: Secondary | ICD-10-CM

## 2013-12-04 DIAGNOSIS — R079 Chest pain, unspecified: Secondary | ICD-10-CM

## 2013-12-04 DIAGNOSIS — R030 Elevated blood-pressure reading, without diagnosis of hypertension: Secondary | ICD-10-CM

## 2013-12-04 DIAGNOSIS — E785 Hyperlipidemia, unspecified: Secondary | ICD-10-CM

## 2013-12-04 DIAGNOSIS — I2510 Atherosclerotic heart disease of native coronary artery without angina pectoris: Secondary | ICD-10-CM | POA: Diagnosis not present

## 2013-12-04 LAB — COMPREHENSIVE METABOLIC PANEL
ALT: 18 U/L (ref 0–35)
AST: 20 U/L (ref 0–37)
Albumin: 4.3 g/dL (ref 3.5–5.2)
Alkaline Phosphatase: 74 U/L (ref 39–117)
BILIRUBIN TOTAL: 0.5 mg/dL (ref 0.2–1.2)
BUN: 12 mg/dL (ref 6–23)
CO2: 29 meq/L (ref 19–32)
CREATININE: 0.48 mg/dL — AB (ref 0.50–1.10)
Calcium: 9.2 mg/dL (ref 8.4–10.5)
Chloride: 106 mEq/L (ref 96–112)
GLUCOSE: 90 mg/dL (ref 70–99)
Potassium: 4.3 mEq/L (ref 3.5–5.3)
Sodium: 141 mEq/L (ref 135–145)
Total Protein: 7.2 g/dL (ref 6.0–8.3)

## 2013-12-04 LAB — LIPID PANEL
CHOL/HDL RATIO: 3.5 ratio
CHOLESTEROL: 134 mg/dL (ref 0–200)
HDL: 38 mg/dL — AB (ref >39)
LDL Cholesterol: 73 mg/dL (ref 0–99)
TRIGLYCERIDES: 115 mg/dL (ref <150)
VLDL: 23 mg/dL (ref 0–40)

## 2013-12-04 NOTE — Patient Instructions (Addendum)
NO CHANGE IN MEDICATION   SOME OF CHEST PAIN IS POSSIBLE FROM ANXIETY AND MUSCULOSKETAL LABS LIPID,CMP   Your physician wants you to follow-up in 6 MONTH DR HARDING. 30 MIN APPTS  You will receive a reminder letter in the mail two months in advance. If you don't receive a letter, please call our office to schedule the follow-up appointment.

## 2013-12-06 ENCOUNTER — Telehealth: Payer: Self-pay | Admitting: *Deleted

## 2013-12-06 ENCOUNTER — Encounter: Payer: Self-pay | Admitting: *Deleted

## 2013-12-06 NOTE — Progress Notes (Addendum)
Quick Note:  Chemistries look great. Lipid panel looks wonderful -- great improvement in LDL and total cholesterol from last year!!!  Marykay LexHARDING,Marlyce Mcdougald W, MD

## 2013-12-06 NOTE — Telephone Encounter (Signed)
Message copied by Tobin ChadMARTIN, Yaasir Menken V. on Wed Dec 06, 2013  8:22 AM ------      Message from: Herbie BaltimoreHARDING, DAVID W      Created: Wed Dec 06, 2013  1:35 AM       Chemistries look great.      Lipid panel looks wonderful -- great improvement in LDL and total cholesterol from last year!!!            Marykay LexHARDING,DAVID W, MD       ------

## 2013-12-06 NOTE — Telephone Encounter (Signed)
Spoke to patient's daughter. Result given . Verbalized understanding  

## 2013-12-07 ENCOUNTER — Encounter: Payer: Self-pay | Admitting: Cardiology

## 2013-12-07 DIAGNOSIS — R079 Chest pain, unspecified: Secondary | ICD-10-CM | POA: Insufficient documentation

## 2013-12-07 NOTE — Progress Notes (Signed)
PCP: Dorrene GermanAVBUERE,EDWIN A, MD  Clinic Note: Chief Complaint  Patient presents with  . 6 month ROV    reports discomfort and difficulty breathing since December; bilat LE edema (knees and sometimes up to hips)    HPI: Tammy Murillo is a 68 y.o. female with a Cardiovascular Problem List below who presents today for followup for CAD.Marland Kitchen.She is a very difficult historian because most of the history comes from translation by her daughter I am not sure exactly how much she is able to get a translation from what I'm saying.  Interval History:  Comes in today saying that she had been traveling in December and since then she started having more notable symptoms of chest discomfort across the top of her chest.  These are again seen to happen when she's anxious, and are associated with dyspnea and a sense of rapid heartbeat. They often happen when she is anxious or excited or not is still exertional in nature. Sometimes she feels dizzy with them but does not have any syncopal or near syncopal type episodes. These are similar to the symptoms that she had prior to her stress test in September. When I tried to clarify with her whether they're similar to her MI symptoms, I don't believe she understands that I am trying of distinguish the difference in symptoms but clearly does appear to be some differences.  No shortness of breath with rest or exertion. No PND, orthopnea or edema.  No palpitations, lightheadedness, dizziness, weakness or syncope/near syncope. No TIA/amaurosis fugax symptoms. No melena, hematochezia hematuria.  Past Medical History  Diagnosis Date  . History of: Non-ST elevation MI (NSTEMI) 12/31/2011    Severe LAD lesions: Sequential 90%, 70% and 60%; no other significant stenoses. She had had a normal Myoview in 2012.  Marland Kitchen. CAD S/P percutaneous coronary angioplasty 12/31/2011    3 sequential LAD lesions treated with a single Promus Element DES  . Presence of drug coated stent in LAD coronary artery  01/01/2012    Promus Element DES 2.75 mm x38 mm postdilated to 3.0 mm  . H/O echocardiogram April 2014    Normal EF: 55-60%, no regional WMA, grade 1 diastolic dysfunction. Mild MR  . Borderline hypertension   . Dyslipidemia, goal LDL below 70   . History of colon cancer  2010  . Costochondritis   . Anxiety   . Osteoarthritis    Prior Cardiac Evaluation and Past Surgical History: Past Surgical History  Procedure Laterality Date  . Coronary angioplasty with stent placement  01/01/2012    Promus Element DES 2.75 MM by 30 MM, postdilated to 3.0 MM  . Cholecystectomy  2004  . Nm myoview ltd  September 2014    No ischemia or infarction. EF 70%  . Transthoracic echocardiogram  April 2014    EF 55%. Grade 1 diastolic dysfunction. Mild MR   MEDICATIONS AND ALLERGIES REVIEWED IN EPIC No Change in Social and Family History  ROS: A comprehensive Review of Systems - Negative except Anxiousness; cramping type pain across the chest associated anxiousness. Otherwise no significant symptoms.  PHYSICAL EXAM BP 124/76  Pulse 50  Ht 5' (1.524 m)  Wt 131 lb 6.4 oz (59.603 kg)  BMI 25.66 kg/m2 General appearance: alert, cooperative, appears stated age, no distress; the last [place]. She does seem to answer her granddaughter's questions directly. Well-nourished well-groomed. Neck: Supple, no adenopathy, no carotid bruit and no JVD Lungs: clear to auscultation bilaterally, normal percussion bilaterally and non-labored Heart: regular rate and  rhythm, S1, S2 normal,  no click, rub or gallop; 1/6 SEM crescendo-decrescendo @ RUSB  Abdomen: soft, non-tender; bowel sounds normal; no masses,  no organomegaly; Extremities: extremities normal, atraumatic, no cyanosis, OR edema Pulses: 2+ and symmetric; Neurologic: Mental status: Alert, oriented, thought content appropriate; there Cranial nerves: normal (II-XII grossly intact)  BJY:NWGNFAOZH today: Yes Rate:50 , Rhythm: Mena Pauls, TWI in V2-V3 (old) CRO  ischemia.  Recent Labs:  None available.    ASSESSMENT / PLAN: Chest pain with low risk for cardiac etiology She continues to describe atypicals symptom that occur with stressful situations or anxiety. I tried extensively to get her to distinguish these symptoms versus her MI symptoms. It does seem that there are dissimilarities these are very similar symptoms that she had back in September which was evaluated with a negative Myoview last fall. The significant lesion she had was stented, therefore the likelihood of a false negative is unlikely.  At this point unless concern of this being angina and more likely to be musculoskeletal or panic attack type symptoms. However she is stable cardiac regimen.  NSTEMI, April 2013 Unfortunately, I did not see her the day she came in with a non-STEMI I did not see her after her cath and PCI. With an LAD stent, mom would expect to see notable differences in a notably significant symptoms if there are issues with a stent.  She is on nitrate, beta blocker ACE inhibitor and statin.  CAD S/P percutaneous coronary angioplasty - DES to LAD April 2013 She remains on aspirin plus Brilinta. I think we can change to Plavix if it would be helpful for her but she seems to be fine with Brilinta for now.    Dyslipidemia Continue statin plus Zetia. She is due for labs check lipid and LFTs.  Borderline hypertension Well-controlled on current medications.    Orders Placed This Encounter  Procedures  . Comprehensive metabolic panel    Order Specific Question:  Has the patient fasted?    Answer:  Yes  . Lipid panel    Order Specific Question:  Has the patient fasted?    Answer:  Yes  . EKG 12-Lead    Order Specific Question:  Where should this test be performed    Answer:  OTHER   Meds ordered this encounter  Medications  . indomethacin (INDOCIN) 50 MG capsule    Sig: Take 50 mg by mouth 3 (three) times daily as needed.  Marland Kitchen atorvastatin (LIPITOR) 40 MG  tablet    Sig: Take 40 mg by mouth daily.    Followup: 6 months  Conner Neiss W. Herbie [place], M.D., M.S. Interventional Cardiologist [facility]

## 2013-12-07 NOTE — Assessment & Plan Note (Signed)
Continue statin plus Zetia. She is due for labs check lipid and LFTs.

## 2013-12-07 NOTE — Assessment & Plan Note (Signed)
She remains on aspirin plus Brilinta. I think we can change to Plavix if it would be helpful for her but she seems to be fine with Brilinta for now.

## 2013-12-07 NOTE — Assessment & Plan Note (Addendum)
Unfortunately, I did not see her the day she came in with a non-STEMI I did not see her after her cath and PCI. With an LAD stent, mom would expect to see notable differences in a notably significant symptoms if there are issues with a stent.  She is on nitrate, beta blocker ACE inhibitor and statin.

## 2013-12-07 NOTE — Assessment & Plan Note (Signed)
Well-controlled on current medications 

## 2013-12-07 NOTE — Assessment & Plan Note (Signed)
She continues to describe atypicals symptom that occur with stressful situations or anxiety. I tried extensively to get her to distinguish these symptoms versus her MI symptoms. It does seem that there are dissimilarities these are very similar symptoms that she had back in September which was evaluated with a negative Myoview last fall. The significant lesion she had was stented, therefore the likelihood of a false negative is unlikely.  At this point unless concern of this being angina and more likely to be musculoskeletal or panic attack type symptoms. However she is stable cardiac regimen.

## 2013-12-19 ENCOUNTER — Other Ambulatory Visit: Payer: Self-pay | Admitting: Cardiology

## 2013-12-20 NOTE — Telephone Encounter (Signed)
Rx was sent to pharmacy electronically. 

## 2013-12-21 ENCOUNTER — Other Ambulatory Visit: Payer: Self-pay

## 2013-12-21 MED ORDER — TICAGRELOR 90 MG PO TABS: 90.0000 mg | ORAL_TABLET | Freq: Two times a day (BID) | ORAL | Status: DC

## 2013-12-21 NOTE — Telephone Encounter (Signed)
Rx was sent to pharmacy electronically. 

## 2013-12-22 ENCOUNTER — Other Ambulatory Visit (HOSPITAL_COMMUNITY): Payer: Self-pay | Admitting: Cardiology

## 2013-12-25 NOTE — Telephone Encounter (Signed)
Rx refill sent to Pharmacy. 

## 2014-02-13 ENCOUNTER — Other Ambulatory Visit: Payer: Self-pay | Admitting: *Deleted

## 2014-02-13 MED ORDER — EZETIMIBE 10 MG PO TABS: 10.0000 mg | ORAL_TABLET | Freq: Every day | ORAL | Status: DC

## 2014-02-16 ENCOUNTER — Other Ambulatory Visit: Payer: Self-pay

## 2014-02-16 MED ORDER — METOPROLOL TARTRATE 25 MG PO TABS: 25.0000 mg | ORAL_TABLET | Freq: Two times a day (BID) | ORAL | Status: DC

## 2014-02-16 NOTE — Telephone Encounter (Signed)
Rx was sent to pharmacy electronically. 

## 2014-04-17 ENCOUNTER — Telehealth: Payer: Self-pay | Admitting: Cardiology

## 2014-04-17 ENCOUNTER — Encounter: Payer: Self-pay | Admitting: Cardiology

## 2014-04-17 MED ORDER — ESOMEPRAZOLE MAGNESIUM 40 MG PO CPDR: 40.0000 mg | DELAYED_RELEASE_CAPSULE | Freq: Every day | ORAL | Status: DC

## 2014-04-17 NOTE — Telephone Encounter (Signed)
Follow Up ° ° ° °Returning call from earlier. Please call. °

## 2014-04-17 NOTE — Telephone Encounter (Signed)
New Prob    Requesting a refill for Nexium sent to [facility] [address]

## 2014-04-17 NOTE — Telephone Encounter (Signed)
This encounter was created in error - please disregard.

## 2014-04-17 NOTE — Telephone Encounter (Signed)
Left message for pt, script has been sent to the pharm

## 2014-05-07 ENCOUNTER — Encounter: Payer: Self-pay | Admitting: Cardiology

## 2014-05-07 ENCOUNTER — Encounter: Payer: Medicare Other | Admitting: Cardiology

## 2014-05-07 VITALS — BP 114/60 | HR 58 | Ht 60.0 in | Wt 132.0 lb

## 2014-05-07 DIAGNOSIS — I2510 Atherosclerotic heart disease of native coronary artery without angina pectoris: Secondary | ICD-10-CM

## 2014-05-07 DIAGNOSIS — Z955 Presence of coronary angioplasty implant and graft: Secondary | ICD-10-CM

## 2014-05-07 DIAGNOSIS — I2583 Coronary atherosclerosis due to lipid rich plaque: Principal | ICD-10-CM

## 2014-05-07 NOTE — Assessment & Plan Note (Signed)
Pt had to leave before she was seen, pt rescheduled.

## 2014-05-07 NOTE — Progress Notes (Signed)
This encounter was created in error - please disregard.

## 2014-05-07 NOTE — Progress Notes (Signed)
Pt was checked in but the translator had to go before I could see her. Pt was now seen, though she was checked in, no charge.  She was to reschedule.

## 2014-05-07 NOTE — Patient Instructions (Signed)
Reschedule for September as  A follow up from march office visit with Dr. Herbie Baltimoreharding

## 2014-06-06 ENCOUNTER — Other Ambulatory Visit: Payer: Self-pay | Admitting: Cardiology

## 2014-06-06 NOTE — Telephone Encounter (Signed)
Rx was sent to pharmacy electronically. 

## 2014-07-02 ENCOUNTER — Ambulatory Visit: Payer: Medicare Other | Admitting: Cardiology

## 2014-07-30 ENCOUNTER — Ambulatory Visit (INDEPENDENT_AMBULATORY_CARE_PROVIDER_SITE_OTHER): Payer: Medicare Other | Admitting: Cardiology

## 2014-07-30 ENCOUNTER — Encounter: Payer: Self-pay | Admitting: Cardiology

## 2014-07-30 VITALS — BP 120/64 | HR 51 | Ht 59.0 in | Wt 130.7 lb

## 2014-07-30 DIAGNOSIS — R079 Chest pain, unspecified: Secondary | ICD-10-CM | POA: Diagnosis not present

## 2014-07-30 DIAGNOSIS — Z9861 Coronary angioplasty status: Secondary | ICD-10-CM

## 2014-07-30 DIAGNOSIS — I2510 Atherosclerotic heart disease of native coronary artery without angina pectoris: Secondary | ICD-10-CM

## 2014-07-30 DIAGNOSIS — Z955 Presence of coronary angioplasty implant and graft: Secondary | ICD-10-CM | POA: Diagnosis not present

## 2014-07-30 DIAGNOSIS — Z79899 Other long term (current) drug therapy: Secondary | ICD-10-CM | POA: Diagnosis not present

## 2014-07-30 DIAGNOSIS — E785 Hyperlipidemia, unspecified: Secondary | ICD-10-CM

## 2014-07-30 DIAGNOSIS — R030 Elevated blood-pressure reading, without diagnosis of hypertension: Secondary | ICD-10-CM

## 2014-07-30 MED ORDER — TICAGRELOR 60 MG PO TABS: 60.0000 mg | ORAL_TABLET | Freq: Two times a day (BID) | ORAL | Status: DC

## 2014-07-30 NOTE — Patient Instructions (Addendum)
Stop brilinta 90 mg  Start Brilinta 60 mg take twice a day   Will need lab work in 6 months will mail you a letter when to have labwork done  Your physician wants you to follow-up in 6 month Dr Herbie BaltimoreHarding. 30 min appt.   You will receive a reminder letter in the mail two months in advance. If you don't receive a letter, please call our office to schedule the follow-up appointment.

## 2014-07-31 ENCOUNTER — Encounter: Payer: Self-pay | Admitting: Cardiology

## 2014-07-31 NOTE — Assessment & Plan Note (Signed)
On statin plus Zetia. Very good labs from last check. We can just follow him up next March.

## 2014-07-31 NOTE — Assessment & Plan Note (Signed)
Well-controlled blood pressure currently on low-dose beta blocker. Did not tolerate ACE inhibitor.

## 2014-07-31 NOTE — Assessment & Plan Note (Signed)
She originally has some atypical discomfort in her chest, but is quite confident that the symptoms she is having is not consistent with her MI type angina pain.

## 2014-07-31 NOTE — Assessment & Plan Note (Signed)
This is probably her most stable that she has had in the long time. She is smiling and happy talking about how much she is exercising. Her blood pressure and heart rate low-grade. She is on a good regimen. No bleeding with Brilinta. I think we can probably switch her down to the 60 mg tablets of Brilinta today. Continue beta blocker. Her low dose ACE inhibitor was stopped previously for low blood pressures. She is on a statin plus Zetia.

## 2014-07-31 NOTE — Progress Notes (Signed)
PCP: Dorrene German, MD  Clinic Note: Chief Complaint  Patient presents with  . 8 months visit    pt states that she does experience sob and chest pain. pt denies swelling   HPI: Tammy Murillo is a 68 y.o. female with a PMH below who presents today for six-month follow-up for coronary disease status post PCI in the setting of a non-STEMI. She is very difficult as a historian because we are using a telephone interpreter line for her [place] dialect. I last saw her in March, and she was doing relatively well. She started exercising more.  Interpreter via Telephonic Interpretation line: Name: An - ID [phone]  Past Medical History  Diagnosis Date  . History of: Non-ST elevation MI (NSTEMI) 12/31/2011    Severe LAD lesions: Sequential 90%, 70% and 60%; no other significant stenoses. She had had a normal Myoview in 2012.  Marland Kitchen CAD S/P percutaneous coronary angioplasty 12/31/2011    3 sequential LAD lesions treated with a single Promus Element DES  . Presence of drug coated stent in LAD coronary artery 01/01/2012    Promus Element DES 2.75 mm x38 mm postdilated to 3.0 mm  . H/O echocardiogram April 2014    Normal EF: 55-60%, no regional WMA, grade 1 diastolic dysfunction. Mild MR  . Borderline hypertension   . Dyslipidemia, goal LDL below 70   . History of colon cancer  2010  . Costochondritis   . Anxiety   . Osteoarthritis    Prior Cardiac Evaluation and Past Surgical History: Past Surgical History  Procedure Laterality Date  . Coronary angioplasty with stent placement  01/01/2012    Promus Element DES 2.75 MM by 30 MM, postdilated to 3.0 MM  . Cholecystectomy  2004  . Nm myoview ltd  September 2014    No ischemia or infarction. EF 70%  . Transthoracic echocardiogram  April 2014    EF 55%. Grade 1 diastolic dysfunction. Mild MR   Interval History: she presents today doing quite well now. She says she's walking just about every day for at least 30 minutes. Gone to frequent trips with  her children to the beach and is able to go walking on the beach without difficulty. If she really increase his exercise level or runs she may feel a bit tired from [place] little bit of chest tightness or dyspnea.but with routine walking, she does fine. Several flights of steps mimicker short of breath but not one or 2 flights. She had to use nitroglycerin one time about a month ago when she got upset, but otherwise has not had to use it since last visit. She denies any heart failure symptoms of PND, orthopnea or edema. No palpitations, lightheadedness, dizziness, weakness or syncope/near syncope.  No TIA/amaurosis fugax symptoms.  She's had a severe headache off and on for the past 4-5 days. It is associated with some mild blurred vision but no scotoma.  A comprehensive ROS was performed. Review of Systems  Constitutional: Negative for weight loss and malaise/fatigue.  HENT: Positive for congestion. Negative for nosebleeds.   Respiratory: Negative for cough, sputum production and wheezing.   Cardiovascular: Negative for claudication.  Musculoskeletal:       Intermittent shoulder and back pain  Neurological: Positive for headaches. Negative for dizziness, sensory change, speech change, focal weakness, seizures, loss of consciousness and weakness.  Endo/Heme/Allergies: Negative.   Psychiatric/Behavioral: Negative for depression. The patient is not nervous/anxious.   All other systems reviewed and are negative.   Current  Outpatient Prescriptions on File Prior to Visit  Medication Sig Dispense Refill  . acetaminophen (TYLENOL) 325 MG tablet Take 650 mg by mouth as needed for pain.    Marland Kitchen. atorvastatin (LIPITOR) 40 MG tablet TAKE 1 TABLET BY MOUTH EVERY DAY AT 6 PM 30 tablet 11  . cetirizine (ZYRTEC) 10 MG tablet Take 10 mg by mouth daily.    Marland Kitchen. esomeprazole (NEXIUM) 40 MG capsule Take 1 capsule (40 mg total) by mouth daily before breakfast. 30 capsule 12  . ezetimibe (ZETIA) 10 MG tablet Take 1  tablet (10 mg total) by mouth daily. 30 tablet 11  . isosorbide mononitrate (IMDUR) 30 MG 24 hr tablet TAKE 1 TABLET BY MOUTH EVERY DAY 30 tablet 6  . metoprolol tartrate (LOPRESSOR) 25 MG tablet Take 1 tablet (25 mg total) by mouth 2 (two) times daily. 60 tablet 10  . NITROSTAT 0.4 MG SL tablet TAKE 1 TABLET UNDER THE TONGUE, EVERY 5 MINUETS FOR 3 DOSES, AS NEEDED FOR CHEST PAIN AS DIRECTED 25 tablet 3   No current facility-administered medications on file prior to visit.   ALLERGIES REVIEWED IN EPIC -- no change SOCIAL AND FAMILY HISTORY REVIEWED IN EPIC -- no change  Wt Readings from Last 3 Encounters:  07/30/14 130 lb 11.2 oz (59.285 kg)  05/07/14 132 lb (59.875 kg)  12/04/13 131 lb 6.4 oz (59.603 kg)   PHYSICAL EXAM BP 120/64 mmHg  Pulse 51  Ht 4\' 11"  (1.499 m)  Wt 130 lb 11.2 oz (59.285 kg)  BMI 26.38 kg/m2 General appearance: alert, cooperative, appears stated age, no distress; the last [place]. She does seem to answer her granddaughter's questions directly. Well-nourished well-groomed. Neck: Supple, no adenopathy, no carotid bruit and no JVD Lungs: clear to auscultation bilaterally, normal percussion bilaterally and non-labored Heart: regular rate and rhythm, S1, S2 normal, no click, rub or gallop; 1/6 SEM crescendo-decrescendo @ RUSB  Abdomen: soft, non-tender; bowel sounds normal; no masses, no organomegaly; Extremities: extremities normal, atraumatic, no cyanosis, OR edema Pulses: 2+ and symmetric; Neurologic: Mental status: Alert, oriented, thought content appropriate; there Cranial nerves: normal (II-XII grossly intact)   Adult ECG Report  Rate: 51 ;  Rhythm: sinus bradycardia, stable anterior ST-T wave abnormalities - cannot exclude ischemia.  Narrative Interpretation: stable EKG  Recent Labs:    Lab Results  Component Value Date   CHOL 134 12/04/2013   HDL 38* 12/04/2013   LDLCALC 73 12/04/2013   TRIG 115 12/04/2013   CHOLHDL 3.5 12/04/2013     Chemistry        Component Value Date/Time   NA 141 12/04/2013 1333   K 4.3 12/04/2013 1333   CL 106 12/04/2013 1333   CO2 29 12/04/2013 1333   BUN 12 12/04/2013 1333   CREATININE 0.48* 12/04/2013 1333   CREATININE 0.49* 01/03/2012 0620      Component Value Date/Time   CALCIUM 9.2 12/04/2013 1333   ALKPHOS 74 12/04/2013 1333   AST 20 12/04/2013 1333   ALT 18 12/04/2013 1333   BILITOT 0.5 12/04/2013 1333     ASSESSMENT / PLAN: CAD S/P percutaneous coronary angioplasty - DES to LAD April 2013 This is probably her most stable that she has had in the long time. She is smiling and happy talking about how much she is exercising. Her blood pressure and heart rate low-grade. She is on a good regimen. No bleeding with Brilinta. I think we can probably switch her down to the 60 mg tablets of Brilinta today.  Continue beta blocker. Her low dose ACE inhibitor was stopped previously for low blood pressures. She is on a statin plus Zetia.  Dyslipidemia, goal LDL below 70 On statin plus Zetia. Very good labs from last check. We can just follow him up next March.  Borderline hypertension Well-controlled blood pressure currently on low-dose beta blocker. Did not tolerate ACE inhibitor.  Chest pain with low risk for cardiac etiology She originally has some atypical discomfort in her chest, but is quite confident that the symptoms she is having is not consistent with her MI type angina pain.    Orders Placed This Encounter  Procedures  . Lipid panel    Standing Status: Future     Number of Occurrences:      Standing Expiration Date: 07/31/2015    Order Specific Question:  Has the patient fasted?    Answer:  Yes  . Comprehensive metabolic panel    Standing Status: Future     Number of Occurrences:      Standing Expiration Date: 07/31/2015    Order Specific Question:  Has the patient fasted?    Answer:  Yes  . EKG 12-Lead   Meds ordered this encounter  Medications  . DISCONTD: ramipril (ALTACE)  1.25 MG capsule    Sig:     Refill:  6  . ticagrelor (BRILINTA) 60 MG TABS tablet    Sig: Take 1 tablet (60 mg total) by mouth 2 (two) times daily.    Dispense:  60 tablet    Refill:  11    Followup: 6 months   Gatsby Chismar W, M.D., M.S. Interventional Cardiologist   Pager # [phone]

## 2014-09-06 ENCOUNTER — Encounter (HOSPITAL_COMMUNITY): Payer: Self-pay | Admitting: Cardiovascular Disease

## 2014-12-04 DIAGNOSIS — M5430 Sciatica, unspecified side: Secondary | ICD-10-CM | POA: Diagnosis not present

## 2014-12-04 DIAGNOSIS — I2510 Atherosclerotic heart disease of native coronary artery without angina pectoris: Secondary | ICD-10-CM | POA: Diagnosis not present

## 2014-12-04 DIAGNOSIS — E784 Other hyperlipidemia: Secondary | ICD-10-CM | POA: Diagnosis not present

## 2014-12-04 DIAGNOSIS — Z1239 Encounter for other screening for malignant neoplasm of breast: Secondary | ICD-10-CM | POA: Diagnosis not present

## 2014-12-04 DIAGNOSIS — Z131 Encounter for screening for diabetes mellitus: Secondary | ICD-10-CM | POA: Diagnosis not present

## 2014-12-09 ENCOUNTER — Other Ambulatory Visit: Payer: Self-pay | Admitting: Cardiology

## 2014-12-10 ENCOUNTER — Other Ambulatory Visit: Payer: Self-pay

## 2014-12-10 DIAGNOSIS — Z1231 Encounter for screening mammogram for malignant neoplasm of breast: Secondary | ICD-10-CM

## 2014-12-10 NOTE — Telephone Encounter (Signed)
Rx has been sent to the pharmacy electronically. ° °

## 2014-12-25 ENCOUNTER — Ambulatory Visit
Admission: RE | Admit: 2014-12-25 | Discharge: 2014-12-25 | Disposition: A | Discharge status: Home / Self Care | Payer: Medicare Other | Source: Ambulatory Visit

## 2014-12-25 DIAGNOSIS — Z1231 Encounter for screening mammogram for malignant neoplasm of breast: Secondary | ICD-10-CM | POA: Diagnosis not present

## 2014-12-27 ENCOUNTER — Other Ambulatory Visit: Payer: Self-pay | Admitting: Internal Medicine

## 2014-12-27 DIAGNOSIS — R928 Other abnormal and inconclusive findings on diagnostic imaging of breast: Secondary | ICD-10-CM

## 2015-01-06 ENCOUNTER — Other Ambulatory Visit: Payer: Self-pay | Admitting: Cardiology

## 2015-01-07 ENCOUNTER — Telehealth: Payer: Self-pay | Admitting: *Deleted

## 2015-01-07 NOTE — Telephone Encounter (Signed)
Per pharmacy patient has only refilled the 90mg  tablets.  Has never switched to the 60mg  bid.  Spoke with patient and daughter and instructed per visit 12/15 decrease Brilinta to 60mg  bid.  Pharmacy notified to take the 90mg  tabs off her profile.  Daughter voiced understanding.

## 2015-01-10 ENCOUNTER — Ambulatory Visit
Admission: RE | Admit: 2015-01-10 | Discharge: 2015-01-10 | Disposition: A | Discharge status: Home / Self Care | Payer: Medicare Other | Source: Ambulatory Visit | Attending: Internal Medicine | Admitting: Internal Medicine

## 2015-01-10 ENCOUNTER — Telehealth: Payer: Self-pay | Admitting: *Deleted

## 2015-01-10 DIAGNOSIS — R928 Other abnormal and inconclusive findings on diagnostic imaging of breast: Secondary | ICD-10-CM

## 2015-01-10 DIAGNOSIS — Z79899 Other long term (current) drug therapy: Secondary | ICD-10-CM

## 2015-01-10 DIAGNOSIS — E785 Hyperlipidemia, unspecified: Secondary | ICD-10-CM

## 2015-01-10 NOTE — Telephone Encounter (Signed)
-----   Message from Tobin ChadSharon V. Martin, RN sent at 07/30/2014 11:19 AM EST ----- Labs - cmp ,lipid due in may 2016 Mail in April 2016

## 2015-01-10 NOTE — Telephone Encounter (Signed)
Mail letter and lab slip. CMP,LIPID. 

## 2015-01-21 ENCOUNTER — Other Ambulatory Visit: Payer: Self-pay | Admitting: Cardiology

## 2015-01-24 DIAGNOSIS — Z79899 Other long term (current) drug therapy: Secondary | ICD-10-CM | POA: Diagnosis not present

## 2015-01-24 DIAGNOSIS — E785 Hyperlipidemia, unspecified: Secondary | ICD-10-CM | POA: Diagnosis not present

## 2015-01-25 LAB — COMPREHENSIVE METABOLIC PANEL
ALT: 20 U/L (ref 0–35)
AST: 23 U/L (ref 0–37)
Albumin: 4.3 g/dL (ref 3.5–5.2)
Alkaline Phosphatase: 69 U/L (ref 39–117)
BILIRUBIN TOTAL: 0.5 mg/dL (ref 0.2–1.2)
BUN: 11 mg/dL (ref 6–23)
CALCIUM: 9.3 mg/dL (ref 8.4–10.5)
CO2: 30 mEq/L (ref 19–32)
Chloride: 103 mEq/L (ref 96–112)
Creat: 0.49 mg/dL — ABNORMAL LOW (ref 0.50–1.10)
Glucose, Bld: 114 mg/dL — ABNORMAL HIGH (ref 70–99)
POTASSIUM: 5 meq/L (ref 3.5–5.3)
Sodium: 141 mEq/L (ref 135–145)
Total Protein: 7.6 g/dL (ref 6.0–8.3)

## 2015-01-25 LAB — LIPID PANEL
CHOLESTEROL: 123 mg/dL (ref 0–200)
HDL: 38 mg/dL — ABNORMAL LOW (ref >=46)
LDL Cholesterol: 59 mg/dL (ref 0–99)
TRIGLYCERIDES: 131 mg/dL (ref <150)
Total CHOL/HDL Ratio: 3.2 Ratio
VLDL: 26 mg/dL (ref 0–40)

## 2015-02-04 ENCOUNTER — Ambulatory Visit (INDEPENDENT_AMBULATORY_CARE_PROVIDER_SITE_OTHER): Payer: Medicare Other | Admitting: Cardiology

## 2015-02-04 ENCOUNTER — Encounter: Payer: Self-pay | Admitting: Cardiology

## 2015-02-04 VITALS — BP 144/76 | HR 64 | Ht <= 58 in | Wt 129.7 lb

## 2015-02-04 DIAGNOSIS — I214 Non-ST elevation (NSTEMI) myocardial infarction: Secondary | ICD-10-CM | POA: Diagnosis not present

## 2015-02-04 DIAGNOSIS — Z9861 Coronary angioplasty status: Secondary | ICD-10-CM

## 2015-02-04 DIAGNOSIS — E785 Hyperlipidemia, unspecified: Secondary | ICD-10-CM

## 2015-02-04 DIAGNOSIS — I2510 Atherosclerotic heart disease of native coronary artery without angina pectoris: Secondary | ICD-10-CM

## 2015-02-04 DIAGNOSIS — R030 Elevated blood-pressure reading, without diagnosis of hypertension: Secondary | ICD-10-CM

## 2015-02-04 MED ORDER — ISOSORBIDE MONONITRATE ER 60 MG PO TB24: 60.0000 mg | ORAL_TABLET | Freq: Every day | ORAL | Status: DC

## 2015-02-04 NOTE — Assessment & Plan Note (Addendum)
She has had several episodes of nonischemic/atypical sounding chest pain, however this most recent episode was a little more concerning.  I do believe however because she has not had any further symptoms with either rest or exertion. With the associated orthopnea. He wondered for blood pressure elevated. Plan: Increase Imdur to 60mg , consider adding ARB for blood pressure control/afterload reduction

## 2015-02-04 NOTE — Progress Notes (Signed)
PCP: Tammy GermanAVBUERE,EDWIN A, MD  Clinic Note: Chief Complaint  Patient presents with  . ROV 6 months    patient reports chest pain in last 2-3 days took NTG once, slight shortness of breath when laying down-durng chest pain episode  . Coronary Artery Disease    HPI: Tammy Murillo is Murillo 69 y.o. female with Murillo PMH below who presents today for ~6 month f/u of CAD. Last seen Nov 2015.   Shortly after her last visit, she went back on visit to see her family and friends in the [place]. She is quite happy about her visit, but likely  Interview with [place] Interpreter -- [name]  Past Medical History  Diagnosis Date  . History of: Non-ST elevation MI (NSTEMI) 12/31/2011    Severe LAD lesions: Sequential 90%, 70% and 60%; no other significant stenoses. She had had Murillo normal Myoview in 2012.  Marland Kitchen. CAD S/P percutaneous coronary angioplasty 12/31/2011    3 sequential LAD lesions treated with Murillo single Promus Element DES  . Presence of drug coated stent in LAD coronary artery 01/01/2012    Promus Element DES 2.75 mm x38 mm postdilated to 3.0 mm  . H/O echocardiogram April 2014    Normal EF: 55-60%, no regional WMA, grade 1 diastolic dysfunction. Mild MR  . Borderline hypertension   . Dyslipidemia, goal LDL below 70   . History of colon cancer  2010  . Costochondritis   . Anxiety   . Osteoarthritis     Prior Cardiac Evaluation and Past Surgical History: Past Surgical History  Procedure Laterality Date  . Coronary angioplasty with stent placement  01/01/2012    Promus Element DES 2.75 MM by 30 MM, postdilated to 3.0 MM  . Cholecystectomy  2004  . Nm myoview ltd  September 2014    No ischemia or infarction. EF 70%  . Transthoracic echocardiogram  April 2014    EF 55%. Grade 1 diastolic dysfunction. Mild MR  . Left heart catheterization with coronary angiogram N/Murillo 01/01/2012    Procedure: LEFT HEART CATHETERIZATION WITH CORONARY ANGIOGRAM;  Surgeon: Lennette Biharihomas Murillo Kelly, MD;  [facility];  Service:  Cardiovascular;  Laterality: N/Murillo;  . Percutaneous coronary stent intervention (pci-s)  01/01/2012    Procedure: PERCUTANEOUS CORONARY STENT INTERVENTION (PCI-S);  Surgeon: Lennette Biharihomas Murillo Kelly, MD;  [facility];  Service: Cardiovascular;;    Interval History: NO trouble in last 6 months until last ~2 days - 1 episode of CP & SOB - took NTG.   The episode was @ rest, while lying down.  Was pretty tired.  Noted some associated orthopnea with the chest discomfort..  She has not had any more symptoms with rest or exertion since.    Other wise, her Cardiovascular ROS is as follows: positive for - chest pain and mild foot swelling @ end of day; CP related orthopnea x 1 negative for - dyspnea on exertion, irregular heartbeat, loss of consciousness, murmur, palpitations, paroxysmal nocturnal dyspnea, rapid heart rate, shortness of breath or syncope/near syncope, TIA/Amaurosis Fugax, = normally no orthopnea No Claudication  ROS: Murillo comprehensive was performed. Review of Systems  Constitutional: Negative for malaise/fatigue.  Respiratory: Negative for cough, shortness of breath and wheezing.   Cardiovascular: Positive for chest pain (1 episode last week). Negative for claudication.  Gastrointestinal: Negative for blood in stool and melena.  Genitourinary: Negative for hematuria.  Musculoskeletal: Negative for falls.  Neurological: Negative for dizziness and headaches.  Endo/Heme/Allergies: Does not bruise/bleed easily.  Psychiatric/Behavioral: Negative.   All other systems reviewed and are negative.   Current Outpatient Prescriptions on File Prior to Visit  Medication Sig Dispense Refill  . acetaminophen (TYLENOL) 325 MG tablet Take 650 mg by mouth as needed for pain.    Marland Kitchen. atorvastatin (LIPITOR) 40 MG tablet TAKE 1 TABLET BY MOUTH EVERY DAY AT 6 PM 30 tablet 11  . cetirizine (ZYRTEC) 10 MG tablet Take 10 mg by mouth daily.    Marland Kitchen. esomeprazole (NEXIUM) 40 MG capsule Take 1 capsule (40 mg total) by  mouth daily before breakfast. 30 capsule 12  . NITROSTAT 0.4 MG SL tablet TAKE 1 TABLET UNDER THE TONGUE, EVERY 5 MINUETS FOR 3 DOSES, AS NEEDED FOR CHEST PAIN AS DIRECTED 25 tablet 3  . ticagrelor (BRILINTA) 60 MG TABS tablet Take 1 tablet (60 mg total) by mouth 2 (two) times daily. (Patient not taking: Reported on 02/04/2015) 60 tablet 11   No current facility-administered medications on file prior to visit.   No Known Allergies  History  Substance Use Topics  . Smoking status: Never Smoker   . Smokeless tobacco: Never Used  . Alcohol Use: No   Family History  Problem Relation Age of Onset  . Coronary artery disease Mother     Wt Readings from Last 3 Encounters:  02/04/15 58.832 kg (129 lb 11.2 oz)  07/30/14 59.285 kg (130 lb 11.2 oz)  05/07/14 59.875 kg (132 lb)    PHYSICAL EXAM BP 144/76 mmHg  Pulse 64  Ht 4\' 10"  (1.473 m)  Wt 58.832 kg (129 lb 11.2 oz)  BMI 27.11 kg/m2 General appearance: alert, cooperative, appears stated age, no distress; the last [place]. She does seem to answer her granddaughter's questions directly. Well-nourished well-groomed. HEENT: [place]/AT, EOMI, MMM, anicteric sclera Neck: Supple, no adenopathy, no carotid bruit and no JVD Lungs: clear to auscultation bilaterally, normal percussion bilaterally and non-labored Heart: regular rate and rhythm, S1, S2 normal, no click, rub or gallop; 1/6 SEM crescendo-decrescendo @ RUSB  Abdomen: soft, non-tender; bowel sounds normal; no masses, no organomegaly; Extremities: extremities normal, atraumatic, no cyanosis, OR edema Pulses: 2+ and symmetric; Neurologic: Mental status: Alert, oriented, thought content appropriate; there Cranial nerves: normal (II-XII grossly intact) Psych: normal, pleasant mood & affet   Adult ECG R2eport  Rate: 64 ;  Rhythm: normal sinus rhythm and Anterior ST-T wave changes - consider ischemia  Narrative Interpretation: Stable EKG - no change from Nov 2015  Recent Labs:   Lab  Results  Component Value Date   CHOL 123 01/24/2015   HDL 38* 01/24/2015   LDLCALC 59 01/24/2015   TRIG 131 01/24/2015   CHOLHDL 3.2 01/24/2015  * Now @ Goal  Lab Results  Component Value Date   CREATININE 0.49* 01/24/2015    ASSESSMENT / PLAN: Problem List Items Addressed This Visit    Borderline hypertension (Chronic)    Borderline elevated blood pressure today. Would consider potentially increasing beta blocker versus adding an ARB. Heart rate was 64, so we don't really have much room to increase the beta blocker. Next probably be the      Relevant Medications   isosorbide mononitrate (IMDUR) 60 MG 24 hr tablet   Other Relevant Orders   EKG 12-Lead (Completed)   CAD S/P percutaneous coronary angioplasty - DES to LAD April 2013 - Primary (Chronic)    She has had several episodes of nonischemic/atypical sounding chest pain, however this most recent episode was Murillo little more concerning.  I do believe  however because she has not had any further symptoms with either rest or exertion. With the associated orthopnea. He wondered for blood pressure elevated. Plan: Increase Imdur to , consider adding ARB for blood pressure control/afterload reduction      Relevant Medications   isosorbide mononitrate (IMDUR) 60 MG 24 hr tablet   Other Relevant Orders   EKG 12-Lead (Completed)   Dyslipidemia, goal LDL below 70 (Chronic)    Current levels @ goal on current meds.  Continue current dose of atorvastatin Recheck in 1 yr.      Relevant Medications   isosorbide mononitrate (IMDUR) 60 MG 24 hr tablet   Other Relevant Orders   EKG 12-Lead (Completed)   NSTEMI, April 2013 (Chronic)   Relevant Medications   isosorbide mononitrate (IMDUR) 60 MG 24 hr tablet   Other Relevant Orders   EKG 12-Lead (Completed)      Orders Placed This Encounter  Procedures  . EKG 12-Lead     Followup: 6 months    Verbie Babic, Piedad Climes, M.D., M.S. Interventional Cardiologist   Pager #  [phone]

## 2015-02-04 NOTE — Patient Instructions (Signed)
INCREASE ISOSORBIDE MN ( IMDUR ) TO 60 MG . ONE TABLET DAILY. Sometime your body gets use to the same medications that's why the increase in medication.  Your lab work looked great.   Your physician wants you to follow-up in 6 month Dr Herbie BaltimoreHarding. 30 min appointment (does not speak english) You will receive a reminder letter in the mail two months in advance. If you don't receive a letter, please call our office to schedule the follow-up appointment.

## 2015-02-04 NOTE — Assessment & Plan Note (Addendum)
Current levels @ goal on current meds.  Continue current dose of atorvastatin Recheck in 1 yr.

## 2015-02-05 ENCOUNTER — Other Ambulatory Visit: Payer: Self-pay | Admitting: Cardiology

## 2015-02-07 ENCOUNTER — Other Ambulatory Visit: Payer: Self-pay | Admitting: Cardiology

## 2015-02-07 ENCOUNTER — Telehealth: Payer: Self-pay | Admitting: Cardiology

## 2015-02-07 MED ORDER — TICAGRELOR 90 MG PO TABS: 90.0000 mg | ORAL_TABLET | Freq: Two times a day (BID) | ORAL | Status: DC

## 2015-02-07 NOTE — Telephone Encounter (Signed)
E sent to pharmacy 

## 2015-02-07 NOTE — Telephone Encounter (Signed)
°  1. Which medications need to be refilled? Brilinta  2. Which pharmacy is medication to be sent to?[phone]  3. Do they need a 30 day or 90 day supply? #60 and refills 4. Would they like a call back once the medication has been sent to the pharmacy? no

## 2015-02-08 ENCOUNTER — Encounter: Payer: Self-pay | Admitting: Cardiology

## 2015-02-08 NOTE — Assessment & Plan Note (Signed)
Borderline elevated blood pressure today. Would consider potentially increasing beta blocker versus adding an ARB. Heart rate was 64, so we don't really have much room to increase the beta blocker. Next probably be the

## 2015-02-08 NOTE — Telephone Encounter (Signed)
The main problem was that the pharmacy did not have the 60 mg tablets so we put her back on 90 mg.    DH  If they have 60 mg tabs in stock - then she is supposed to be on 60 mg.  DH

## 2015-02-11 ENCOUNTER — Telehealth: Payer: Self-pay | Admitting: *Deleted

## 2015-02-11 NOTE — Telephone Encounter (Signed)
SPOKE TO PHARMACIST  [facility] ( [facility]) CLARIFICATION IF PATIENTS MEDICATION   Discontinue Brilinta 90 mg twice a day , prescription  For Brilinta  60 mg twice a day was verbal given to pharmacist.  pharmacist states patient picked medication last week. RN states will contact patient's daughter and patient of medication change.

## 2015-02-11 NOTE — Telephone Encounter (Signed)
Left message on voice mail  To call back. 

## 2015-02-14 NOTE — Telephone Encounter (Signed)
Left message on daughters voicemail to call back.

## 2015-02-18 NOTE — Telephone Encounter (Signed)
Late entry  Spoke to daughter on 02/14/15 She is aware Complete BRILINTA 90  twice a day then change to Brilinta 60 mg Twice a day Deveron FurlongKaylen (daughter) is aware.

## 2015-05-04 ENCOUNTER — Other Ambulatory Visit: Payer: Self-pay | Admitting: Cardiology

## 2015-05-06 NOTE — Telephone Encounter (Signed)
Rx(s) sent to pharmacy electronically.  

## 2015-06-27 ENCOUNTER — Other Ambulatory Visit: Payer: Self-pay | Admitting: Cardiology

## 2015-06-27 NOTE — Telephone Encounter (Signed)
REFILL 

## 2015-07-08 ENCOUNTER — Other Ambulatory Visit: Payer: Self-pay | Admitting: Cardiology

## 2015-07-08 ENCOUNTER — Other Ambulatory Visit: Payer: Self-pay

## 2015-07-08 MED ORDER — TICAGRELOR 90 MG PO TABS: 90.0000 mg | ORAL_TABLET | Freq: Two times a day (BID) | ORAL | Status: DC

## 2015-08-19 ENCOUNTER — Ambulatory Visit (INDEPENDENT_AMBULATORY_CARE_PROVIDER_SITE_OTHER): Payer: Medicare Other | Admitting: Cardiology

## 2015-08-19 VITALS — BP 114/64 | HR 51 | Ht <= 58 in | Wt 126.8 lb

## 2015-08-19 DIAGNOSIS — Z9861 Coronary angioplasty status: Secondary | ICD-10-CM

## 2015-08-19 DIAGNOSIS — Z79899 Other long term (current) drug therapy: Secondary | ICD-10-CM | POA: Diagnosis not present

## 2015-08-19 DIAGNOSIS — E785 Hyperlipidemia, unspecified: Secondary | ICD-10-CM | POA: Diagnosis not present

## 2015-08-19 DIAGNOSIS — Z955 Presence of coronary angioplasty implant and graft: Secondary | ICD-10-CM | POA: Diagnosis not present

## 2015-08-19 DIAGNOSIS — I2510 Atherosclerotic heart disease of native coronary artery without angina pectoris: Secondary | ICD-10-CM | POA: Diagnosis not present

## 2015-08-19 DIAGNOSIS — I214 Non-ST elevation (NSTEMI) myocardial infarction: Secondary | ICD-10-CM

## 2015-08-19 DIAGNOSIS — R030 Elevated blood-pressure reading, without diagnosis of hypertension: Secondary | ICD-10-CM

## 2015-08-19 NOTE — Progress Notes (Signed)
PCP: Dorrene German, MD  Clinic Note: Chief Complaint  Patient presents with  . Follow-up    CAD-PCI  . Chest Pain    pt states she has some chest pain  . Shortness of Breath    sometimes no light headedness or dizziness but has a lot of headaches  . Edema    no edema but has pain that goes to her hip    HPI: Tammy Murillo is a 69 y.o. female with a PMH below who presents today for 6 month f/u of CAD-PCI.  Tammy Murillo was last seen in May 2016 (had been of a trip back to [place])  Recent Hospitalizations: none  Studies Reviewed: none  Interval History: No major issues in last 6 months - still takes meds regularly.  She still has her mild musculoskeletal pain in the upper substernal region, but is not similar to her anginal symptoms. She is otherwise very active. She's been doing some traveling within the country with her family members. And having no complications.  No chest pain or shortness of breath with rest or exertion. No PND, orthopnea or edema. No palpitations, lightheadedness, dizziness, weakness or syncope/near syncope. No TIA/amaurosis fugax symptoms. No melena, hematochezia, hematuria, or epstaxis. No claudication.  ROS: A comprehensive was performed. Review of Systems  Cardiovascular: Negative for claudication.  Gastrointestinal: Negative for blood in stool and melena.  Genitourinary: Negative for hematuria.  Musculoskeletal:       MSK Chest wall pain - chronic  Neurological: Positive for headaches (Has HA today - usually better with Tylenol - but come & go every few days)).  Psychiatric/Behavioral: The patient is not nervous/anxious.   All other systems reviewed and are negative.   Past Medical History  Diagnosis Date  . History of: Non-ST elevation MI (NSTEMI) 12/31/2011    Severe LAD lesions: Sequential 90%, 70% and 60%; no other significant stenoses. She had had a normal Myoview in 2012.  Marland Kitchen CAD S/P percutaneous coronary angioplasty 12/31/2011    3  sequential LAD lesions treated with a single Promus Element DES  . Presence of drug coated stent in LAD coronary artery 01/01/2012    Promus Element DES 2.75 mm x38 mm postdilated to 3.0 mm  . H/O echocardiogram April 2014    Normal EF: 55-60%, no regional WMA, grade 1 diastolic dysfunction. Mild MR  . Borderline hypertension   . Dyslipidemia, goal LDL below 70   . History of colon cancer  2010  . Costochondritis   . Anxiety   . Osteoarthritis     Past Surgical History  Procedure Laterality Date  . Coronary angioplasty with stent placement  01/01/2012    Promus Element DES 2.75 MM by 30 MM, postdilated to 3.0 MM  . Cholecystectomy  2004  . Nm myoview ltd  September 2014    No ischemia or infarction. EF 70%  . Transthoracic echocardiogram  April 2014    EF 55%. Grade 1 diastolic dysfunction. Mild MR  . Left heart catheterization with coronary angiogram N/A 01/01/2012    Procedure: LEFT HEART CATHETERIZATION WITH CORONARY ANGIOGRAM;  Surgeon: Lennette Bihari, MD;  Location: [facility] CATH LAB;  Service: Cardiovascular;  Laterality: N/A;  . Percutaneous coronary stent intervention (pci-s)  01/01/2012    Procedure: PERCUTANEOUS CORONARY STENT INTERVENTION (PCI-S);  Surgeon: Lennette Bihari, MD;  Location: [facility] C[facility] LAB;  Service: Cardiovascular;;    Prior to Admission medications   Medication Sig Start Date End Date Taking? Authorizing Provider  acetaminophen (TYLENOL) 325 MG tablet Take 650 mg by mouth as needed for pain.   Yes Historical Provider, MD  atorvastatin (LIPITOR) 40 MG tablet TAKE 1 TABLET BY MOUTH EVERY DAY AT 6 PM 12/10/14  Yes Marykay Lexavid W Harding, MD  diclofenac (VOLTAREN) 75 MG EC tablet Take 1 tablet by mouth 2 (two) times daily as needed. 01/08/15  Yes Historical Provider, MD  isosorbide mononitrate (IMDUR) 60 MG 24 hr tablet Take 1 tablet (60 mg total) by mouth daily. 02/04/15  Yes Marykay Lexavid W Harding, MD  metoprolol tartrate (LOPRESSOR) 25 MG tablet Take 1 tablet (25 mg total) by mouth 2 (two)  times daily. 02/05/15  Yes Marykay Lexavid W Harding, MD  NEXIUM 40 MG capsule TAKE ONE CAPSULE BY MOUTH EVERY DAY BEFORE BREAKFAST 05/06/15  Yes Marykay Lexavid W Harding, MD  NITROSTAT 0.4 MG SL tablet TAKE 1 TABLET UNDER THE TONGUE, EVERY 5 MINUETS FOR 3 DOSES, AS NEEDED FOR CHEST PAIN AS DIRECTED 08/02/13  Yes Leone BrandLaura R Ingold, NP  ticagrelor (BRILINTA) 90 MG TABS tablet Take 1 tablet (90 mg total) by mouth 2 (two) times daily. NEED OV. 07/08/15  Yes Marykay Lexavid W Harding, MD  tiZANidine (ZANAFLEX) 4 MG tablet Take 4 mg by mouth at bedtime as needed. 01/08/15  Yes Historical Provider, MD  VOLTAREN 1 % GEL Apply 1 application topically 4 (four) times daily as needed. 12/26/14  Yes Historical Provider, MD  ZETIA 10 MG tablet TAKE 1 TABLET BY MOUTH EVERY DAY 02/07/15  Yes Marykay Lexavid W Harding, MD   No Known Allergies   Social History   Social History  . Marital Status: Married    Spouse Name: N/A  . Number of Children: N/A  . Years of Education: N/A   Social History Main Topics  . Smoking status: Never Smoker   . Smokeless tobacco: Never Used  . Alcohol Use: No  . Drug Use: No  . Sexual Activity: No   Other Topics Concern  . None   Social History Narrative   She is a native [place] woman. She is married with 5 children and 7 grandchildren. One of her daughters or her granddaughters usually comes as an Equities traderinterpreter. She does not speak any [place].   The granddaughter with her today notes that the patient is significantly prone to anxiety and stress related to family issues. Most of her symptoms are related to stressful situations in house.   She does not smoke or drink. She does not routinely exercise.   Family History  Problem Relation Age of Onset  . Coronary artery disease Mother     Wt Readings from Last 3 Encounters:  08/19/15 126 lb 12.8 oz (57.516 kg)  02/04/15 129 lb 11.2 oz (58.832 kg)  07/30/14 130 lb 11.2 oz (59.285 kg)  -- tyring to loose weight   PHYSICAL EXAM BP 114/64 mmHg  Pulse 51  Ht 4'  10" (1.473 m)  Wt 126 lb 12.8 oz (57.516 kg)  BMI 26.51 kg/m2 General appearance: alert, cooperative, appears stated age, no distress; the last [place]. She does seem to answer her granddaughter's questions directly. Well-nourished well-groomed. HEENT: [place]/AT, EOMI, MMM, anicteric sclera Neck: Supple, no adenopathy, no carotid bruit and no JVD Lungs: clear to auscultation bilaterally, normal percussion bilaterally and non-labored Heart: regular rate and rhythm, S1, S2 normal, no click, rub or gallop; 1/6 SEM crescendo-decrescendo @ RUSB  Abdomen: soft, non-tender; bowel sounds normal; no masses, no organomegaly; Extremities: extremities normal, atraumatic, no cyanosis, OR edema Pulses: 2+ and symmetric;  Neurologic: Mental status: Alert, oriented, thought content appropriate; there Cranial nerves: normal (II-XII grossly intact) Psych: normal, pleasant mood & affect    Adult ECG Report  Rate: 51 ;  Rhythm: sinus bradycardia and Nonspecific ST-T wave abnormalities.;   Narrative Interpretation: Otherwise normal axis, intervals and durations. - Stable EKG  Other studies Reviewed: Additional studies/ records that were reviewed today include:  Recent Labs:   Lab Results  Component Value Date   CHOL 123 01/24/2015   HDL 38* 01/24/2015   LDLCALC 59 01/24/2015   TRIG 131 01/24/2015   CHOLHDL 3.2 01/24/2015     ASSESSMENT / PLAN: Problem List Items Addressed This Visit    S/P coronary artery stent placement to the LAD  01/01/12, with Promus DES, X 3 in the LAD  (Chronic)    On Brilinta alone. Tolerating well without bleeding.      NSTEMI, April 2013 (Chronic)   Dyslipidemia, goal LDL below 70 (Chronic)    Excellent control last spring. We'll have her recheck annual follow-up labs. Continue atorvastatin plus Zetia. Continue dietary modifications and weight loss. She is very excited that she's lost 4 pounds in the last year.      Relevant Orders   EKG 12-Lead (Completed)   Lipid  panel   Comprehensive metabolic panel   CAD S/P percutaneous coronary angioplasty - DES to LAD April 2013 - Primary (Chronic)    This is the longest period that she is gone without having any somewhat concerning symptoms for possible angina. She is quite familiar now with what is musculoskeletal versus anginal pain. Plan:  Continue current dose of beta blocker  On Brilinta without aspirin - tried to reduce to 60 mg maintenance dosing, but the pharmacy did not have it.  On atorvastatin, tolerating well.  On Imdur.  No blood pressure room for adding ACE inhibitor or ARB.      Relevant Orders   EKG 12-Lead (Completed)   Lipid panel   Comprehensive metabolic panel   Borderline hypertension (Chronic)    Well-controlled pressure today on low-dose beta blocker plus Imdur. No room to titrate up beta blocker any further, if blood pressure were to increase would use ARB or ACE inhibitor.      Relevant Orders   EKG 12-Lead (Completed)   Lipid panel   Comprehensive metabolic panel    Other Visit Diagnoses    Drug therapy        Relevant Orders    Lipid panel    Comprehensive metabolic panel       Current medicines are reviewed at length with the patient today. (+/- concerns) none The following changes have been made: None Studies Ordered:  Lab check prior to follow-up Orders Placed This Encounter  Procedures  . Lipid panel  . Comprehensive metabolic panel  . EKG 12-Lead    ROV 6 months   HARDING, Piedad Climes, M.D., M.S. Interventional Cardiologist   Pager # [phone]

## 2015-08-19 NOTE — Patient Instructions (Addendum)
Your physician wants you to follow-up in   6 MONTHS DR HARDING- 30 MIN APP. You will receive a reminder letter in the mail two months in advance. If you don't receive a letter, please call our office to schedule the follow-up appointment.  NO CHANGE IN CURRENT MEDICATIONS.  LIPIDS AND CMP IN MARCH 2017 - WILL MAIL LABSLIP   If you need a refill on your cardiac medications before your next appointment, please call your pharmacy.

## 2015-08-21 ENCOUNTER — Encounter: Payer: Self-pay | Admitting: Cardiology

## 2015-08-21 NOTE — Assessment & Plan Note (Signed)
Well-controlled pressure today on low-dose beta blocker plus Imdur. No room to titrate up beta blocker any further, if blood pressure were to increase would use ARB or ACE inhibitor.

## 2015-08-21 NOTE — Assessment & Plan Note (Signed)
Excellent control last spring. We'll have her recheck annual follow-up labs. Continue atorvastatin plus Zetia. Continue dietary modifications and weight loss. She is very excited that she's lost 4 pounds in the last year.

## 2015-08-21 NOTE — Assessment & Plan Note (Signed)
This is the longest period that she is gone without having any somewhat concerning symptoms for possible angina. She is quite familiar now with what is musculoskeletal versus anginal pain. Plan:  Continue current dose of beta blocker  On Brilinta without aspirin - tried to reduce to 60 mg maintenance dosing, but the pharmacy did not have it.  On atorvastatin, tolerating well.  On Imdur.  No blood pressure room for adding ACE inhibitor or ARB.

## 2015-08-21 NOTE — Assessment & Plan Note (Signed)
On Brilinta alone. Tolerating well without bleeding.

## 2015-10-17 ENCOUNTER — Telehealth: Payer: Self-pay | Admitting: Cardiology

## 2015-10-17 DIAGNOSIS — R030 Elevated blood-pressure reading, without diagnosis of hypertension: Secondary | ICD-10-CM

## 2015-10-17 DIAGNOSIS — I2510 Atherosclerotic heart disease of native coronary artery without angina pectoris: Secondary | ICD-10-CM

## 2015-10-17 DIAGNOSIS — I214 Non-ST elevation (NSTEMI) myocardial infarction: Secondary | ICD-10-CM

## 2015-10-17 DIAGNOSIS — Z9861 Coronary angioplasty status: Principal | ICD-10-CM

## 2015-10-17 DIAGNOSIS — E785 Hyperlipidemia, unspecified: Secondary | ICD-10-CM

## 2015-10-17 MED ORDER — ISOSORBIDE MONONITRATE ER 60 MG PO TB24: 60.0000 mg | ORAL_TABLET | Freq: Every day | ORAL | Status: DC

## 2015-10-17 NOTE — Telephone Encounter (Signed)
°*  STAT* If patient is at the pharmacy, call can be transferred to refill team.   1. Which medications need to be refilled? (please list name of each medication and dose if known) Isosorbide   2. Which pharmacy/location (including street and city if local pharmacy) is medication to be sent to? [facility] on [address]   3. Do they need a 30 day or 90 day supply? 30

## 2015-11-08 ENCOUNTER — Other Ambulatory Visit: Payer: Self-pay | Admitting: Cardiology

## 2015-11-08 NOTE — Telephone Encounter (Signed)
imdur refilled  90 tablet 3 10/17/2015

## 2015-11-23 ENCOUNTER — Other Ambulatory Visit: Payer: Self-pay | Admitting: Cardiology

## 2015-11-25 NOTE — Telephone Encounter (Signed)
Rx(s) sent to pharmacy electronically.  

## 2016-02-18 ENCOUNTER — Other Ambulatory Visit: Payer: Self-pay | Admitting: Cardiology

## 2016-02-18 NOTE — Telephone Encounter (Signed)
Rx(s) sent to pharmacy electronically.  

## 2016-02-20 ENCOUNTER — Encounter: Payer: Self-pay | Admitting: *Deleted

## 2016-02-20 ENCOUNTER — Ambulatory Visit: Payer: Medicare Other | Admitting: Cardiology

## 2016-02-27 HISTORY — PX: NM MYOVIEW LTD: HXRAD82

## 2016-03-11 ENCOUNTER — Emergency Department (HOSPITAL_COMMUNITY): Payer: Medicare Other

## 2016-03-11 ENCOUNTER — Encounter (HOSPITAL_COMMUNITY): Payer: Self-pay | Admitting: Emergency Medicine

## 2016-03-11 ENCOUNTER — Emergency Department (HOSPITAL_COMMUNITY)
Admission: EM | Admit: 2016-03-11 | Discharge: 2016-03-12 | Disposition: A | Discharge status: Home / Self Care | Payer: Medicare Other | Attending: Emergency Medicine | Admitting: Emergency Medicine

## 2016-03-11 DIAGNOSIS — R072 Precordial pain: Secondary | ICD-10-CM | POA: Insufficient documentation

## 2016-03-11 DIAGNOSIS — R0602 Shortness of breath: Secondary | ICD-10-CM | POA: Diagnosis not present

## 2016-03-11 DIAGNOSIS — R0789 Other chest pain: Secondary | ICD-10-CM | POA: Diagnosis not present

## 2016-03-11 DIAGNOSIS — Z85038 Personal history of other malignant neoplasm of large intestine: Secondary | ICD-10-CM | POA: Diagnosis not present

## 2016-03-11 DIAGNOSIS — R079 Chest pain, unspecified: Secondary | ICD-10-CM

## 2016-03-11 DIAGNOSIS — I10 Essential (primary) hypertension: Secondary | ICD-10-CM | POA: Insufficient documentation

## 2016-03-11 DIAGNOSIS — Z955 Presence of coronary angioplasty implant and graft: Secondary | ICD-10-CM | POA: Insufficient documentation

## 2016-03-11 DIAGNOSIS — I252 Old myocardial infarction: Secondary | ICD-10-CM | POA: Diagnosis not present

## 2016-03-11 DIAGNOSIS — I2510 Atherosclerotic heart disease of native coronary artery without angina pectoris: Secondary | ICD-10-CM | POA: Diagnosis not present

## 2016-03-11 DIAGNOSIS — Z79899 Other long term (current) drug therapy: Secondary | ICD-10-CM | POA: Diagnosis not present

## 2016-03-11 NOTE — ED Provider Notes (Signed)
CSN: [id number]     Arrival date & time 03/11/16  2340 History  By signing my name below, I, Tammy Murillo, attest that this documentation has been prepared under the direction and in the presence of Gilda Crease, MD. Electronically Signed: [name], ED Scribe. 03/12/2016. 2:22 AM   Chief Complaint  Patient presents with  . Chest Pain   The history is provided by the patient and a relative. A language interpreter was used (Relative a bedside).   Brought in by EMS from home, Tammy Murillo is a 70 y.o. female with PMHx of NSTEMI and stent placement, HTN, dyslipidemia, who presents to the Emergency Department complaining of heavy substernal chest pain with onset 1 hour ago. The pt states that she has had similar pain in the past, most recently 1 month ago. She is pain free at present after 1 tablet of sublingual NTG and four 81 mg aspirin PTA.  Associated symptoms include fatigue and SOB. She sees Dr. Herbie Baltimore for cardiology.   Past Medical History  Diagnosis Date  . History of: Non-ST elevation MI (NSTEMI) 12/31/2011    Severe LAD lesions: Sequential 90%, 70% and 60%; no other significant stenoses. She had had a normal Myoview in 2012.  Marland Kitchen CAD S/P percutaneous coronary angioplasty 12/31/2011    3 sequential LAD lesions treated with a single Promus Element DES  . Presence of drug coated stent in LAD coronary artery 01/01/2012    Promus Element DES 2.75 mm x38 mm postdilated to 3.0 mm  . H/O echocardiogram April 2014    Normal EF: 55-60%, no regional WMA, grade 1 diastolic dysfunction. Mild MR  . Borderline hypertension   . Dyslipidemia, goal LDL below 70   . History of colon cancer  2010  . Costochondritis   . Anxiety   . Osteoarthritis    Past Surgical History  Procedure Laterality Date  . Coronary angioplasty with stent placement  01/01/2012    Promus Element DES 2.75 MM by 30 MM, postdilated to 3.0 MM  . Cholecystectomy  2004  . Nm myoview ltd  September 2014    No ischemia  or infarction. EF 70%  . Transthoracic echocardiogram  April 2014    EF 55%. Grade 1 diastolic dysfunction. Mild MR  . Left heart catheterization with coronary angiogram N/A 01/01/2012    Procedure: LEFT HEART CATHETERIZATION WITH CORONARY ANGIOGRAM;  Surgeon: Lennette Bihari, MD;  Location: [facility] CATH LAB;  Service: Cardiovascular;  Laterality: N/A;  . Percutaneous coronary stent intervention (pci-s)  01/01/2012    Procedure: PERCUTANEOUS CORONARY STENT INTERVENTION (PCI-S);  Surgeon: Lennette Bihari, MD;  Location: [facility] CATH LAB;  Service: Cardiovascular;;   Family History  Problem Relation Age of Onset  . Coronary artery disease Mother    Social History  Substance Use Topics  . Smoking status: Never Smoker   . Smokeless tobacco: Never Used  . Alcohol Use: No   OB History    No data available     Review of Systems  Constitutional: Positive for fatigue.  Respiratory: Positive for shortness of breath.   Cardiovascular: Positive for chest pain.  All other systems reviewed and are negative.  Allergies  Review of patient's allergies indicates no known allergies.  Home Medications   Prior to Admission medications   Medication Sig Start Date End Date Taking? Authorizing Provider  acetaminophen (TYLENOL) 325 MG tablet Take 650 mg by mouth as needed for pain.    Historical Provider, MD  atorvastatin (LIPITOR)  40 MG tablet TAKE 1 TABLET BY MOUTH EVERY DAY AT 6 PM 11/25/15   Marykay Lexavid W Harding, MD  diclofenac (VOLTAREN) 75 MG EC tablet Take 1 tablet by mouth 2 (two) times daily as needed. 01/08/15   Historical Provider, MD  esomeprazole (NEXIUM) 40 MG capsule TAKE ONE CAPSULE BY MOUTH EVERY DAY BEFORE BREAKFAST 02/18/16   Marykay Lexavid W Harding, MD  isosorbide mononitrate (IMDUR) 60 MG 24 hr tablet Take 1 tablet (60 mg total) by mouth daily. 10/17/15   Marykay Lexavid W Harding, MD  metoprolol tartrate (LOPRESSOR) 25 MG tablet TAKE 1 TABLET BY MOUTH TWICE A DAY 02/18/16   Marykay Lexavid W Harding, MD  NITROSTAT 0.4 MG SL tablet  TAKE 1 TABLET UNDER THE TONGUE, EVERY 5 MINUETS FOR 3 DOSES, AS NEEDED FOR CHEST PAIN AS DIRECTED 08/02/13   Leone BrandLaura R Ingold, NP  ticagrelor (BRILINTA) 90 MG TABS tablet Take 1 tablet (90 mg total) by mouth 2 (two) times daily. NEED OV. 07/08/15   Marykay Lexavid W Harding, MD  tiZANidine (ZANAFLEX) 4 MG tablet Take 4 mg by mouth at bedtime as needed. 01/08/15   Historical Provider, MD  VOLTAREN 1 % GEL Apply 1 application topically 4 (four) times daily as needed. 12/26/14   Historical Provider, MD  ZETIA 10 MG tablet TAKE 1 TABLET BY MOUTH EVERY DAY 02/18/16   Marykay Lexavid W Harding, MD   BP 131/80 mmHg  Pulse 58  Temp(Src) 98 F (36.7 C) (Oral)  Resp 15  Ht 5' (1.524 m)  Wt 122 lb (55.339 kg)  BMI 23.83 kg/m2  SpO2 99% Physical Exam  Constitutional: She is oriented to person, place, and time. She appears well-developed and well-nourished. No distress.  HENT:  Head: Normocephalic and atraumatic.  Right Ear: Hearing normal.  Left Ear: Hearing normal.  Nose: Nose normal.  Mouth/Throat: Oropharynx is clear and moist and mucous membranes are normal.  Eyes: Conjunctivae and EOM are normal. Pupils are equal, round, and reactive to light.  Neck: Normal range of motion. Neck supple.  Cardiovascular: Regular rhythm, S1 normal and S2 normal.  Exam reveals no gallop and no friction rub.   No murmur heard. Pulmonary/Chest: Effort normal and breath sounds normal. No respiratory distress. She exhibits no tenderness.  Abdominal: Soft. Normal appearance and bowel sounds are normal. There is no hepatosplenomegaly. There is no tenderness. There is no rebound, no guarding, no tenderness at McBurney's point and negative Murphy's sign. No hernia.  Musculoskeletal: Normal range of motion.  Neurological: She is alert and oriented to person, place, and time. She has normal strength. No cranial nerve deficit or sensory deficit. Coordination normal. GCS eye subscore is 4. GCS verbal subscore is 5. GCS motor subscore is 6.  Skin:  Skin is warm, dry and intact. No rash noted. No cyanosis.  Psychiatric: She has a normal mood and affect. Her speech is normal and behavior is normal. Thought content normal.  Nursing note and vitals reviewed.   ED Course  Procedures (including critical care time) DIAGNOSTIC STUDIES: Oxygen Saturation is 99% on RA,  normal by my interpretation.    COORDINATION OF CARE: 11:47 PM Discussed treatment plan which includes lab work, CXR, EKG with pt at bedside and pt agreed to plan.  2:20 AM-Consult complete with Dr. Delton SeeNelson (Cardiology). Patient case explained and discussed. He is in agreement with the plan for a second troponin and discharge if negative.  Call ended at 2:21 AM   Labs Review Labs Reviewed  CBC WITH DIFFERENTIAL/PLATELET - Abnormal; Notable  for the following:    Hemoglobin 10.7 (*)    HCT 34.1 (*)    MCV 67.4 (*)    MCH 21.1 (*)    RDW 16.0 (*)    All other components within normal limits  COMPREHENSIVE METABOLIC PANEL - Abnormal; Notable for the following:    Glucose, Bld 106 (*)    All other components within normal limits  TROPONIN I  BRAIN NATRIURETIC PEPTIDE    Imaging Review Dg Chest 2 View  03/12/2016  CLINICAL DATA:  Acute onset of mid chest pain and shortness of breath. Initial encounter. EXAM: CHEST  2 VIEW COMPARISON:  Chest radiograph performed 01/01/2012 FINDINGS: The lungs are well-aerated. Mild peribronchial thickening is noted. Mild left basilar airspace opacity may reflect atelectasis or mild pneumonia. There is no evidence of pleural effusion or pneumothorax. The heart is enlarged. No acute osseous abnormalities are seen. Clips are noted within the right upper quadrant, reflecting prior cholecystectomy. IMPRESSION: Mild peribronchial thickening noted. Mild left basilar airspace opacity may reflect atelectasis or mild pneumonia. Cardiomegaly. Electronically Signed   By: Roanna Raider M.D.   On: 03/12/2016 00:24   I have personally reviewed and evaluated  these images and lab results as part of my medical decision-making.   EKG Interpretation   Date/Time:  Wednesday March 11 2016 23:42:37 EDT Ventricular Rate:  56 PR Interval:  177 QRS Duration: 85 QT Interval:  456 QTC Calculation: 440 R Axis:   69 Text Interpretation:  Sinus rhythm Borderline repolarization abnormality  No significant change since last tracing Confirmed by Arnet Hofferber  MD,  Kalana Yust [phone]) on 03/12/2016 12:14:27 AM      MDM   Final diagnoses:  None  chest pain  Patient presents to the emergency department for evaluation of chest pain. Patient reports that she had onset of heaviness in her chest with shortness of breath earlier tonight. Patient received 1 nitroglycerin and aspirin and had resolution of her pain. She reports that she has had some intermittent pains in the past, most recently requiring nitroglycerin one month ago.  Reviewed. Patient does have a history of NSTEMI with drug-eluting stent placement in the LAD in 2013. Patient has unchanged EKG and negative troponin.  Case discussed with Dr. Delton See, on call for cardiology. She has reviewed the patient's previous cardiac records. We haven't decided that the patient is appropriate for outpatient follow-up and stress test if second troponin is negative. Second troponin performed and is negative. Patient will be discharged. Cardiology will contact her tomorrow with follow-up.  I personally performed the services described in this documentation, which was scribed in my presence. The recorded information has been reviewed and is accurate.   Gilda Crease, MD 03/12/16 [phone]

## 2016-03-11 NOTE — ED Notes (Addendum)
Pt. arrived with EMS from home reports intermittent central chest heaviness for 1 month , mild SOB , denies nausea or diaphoresis . Pt. received 1 NTG sl and 4 baby ASA prior to arrival . Denies pain at arrival .

## 2016-03-12 ENCOUNTER — Telehealth: Payer: Self-pay | Admitting: Physician Assistant

## 2016-03-12 ENCOUNTER — Emergency Department (HOSPITAL_COMMUNITY): Payer: Medicare Other

## 2016-03-12 DIAGNOSIS — R079 Chest pain, unspecified: Secondary | ICD-10-CM

## 2016-03-12 DIAGNOSIS — R072 Precordial pain: Secondary | ICD-10-CM | POA: Diagnosis not present

## 2016-03-12 LAB — COMPREHENSIVE METABOLIC PANEL
ALT: 20 U/L (ref 14–54)
AST: 26 U/L (ref 15–41)
Albumin: 4 g/dL (ref 3.5–5.0)
Alkaline Phosphatase: 67 U/L (ref 38–126)
Anion gap: 8 (ref 5–15)
BUN: 8 mg/dL (ref 6–20)
CHLORIDE: 110 mmol/L (ref 101–111)
CO2: 22 mmol/L (ref 22–32)
Calcium: 9.6 mg/dL (ref 8.9–10.3)
Creatinine, Ser: 0.51 mg/dL (ref 0.44–1.00)
GFR calc Af Amer: >60 mL/min (ref >60)
GFR calc non Af Amer: >60 mL/min (ref >60)
GLUCOSE: 106 mg/dL — AB (ref 65–99)
POTASSIUM: 3.7 mmol/L (ref 3.5–5.1)
SODIUM: 140 mmol/L (ref 135–145)
Total Bilirubin: 0.7 mg/dL (ref 0.3–1.2)
Total Protein: 7.2 g/dL (ref 6.5–8.1)

## 2016-03-12 LAB — CBC WITH DIFFERENTIAL/PLATELET
BASOS ABS: 0 10*3/uL (ref 0.0–0.1)
BASOS PCT: 0 %
EOS PCT: 1 %
Eosinophils Absolute: 0.1 10*3/uL (ref 0.0–0.7)
HEMATOCRIT: 34.1 % — AB (ref 36.0–46.0)
Hemoglobin: 10.7 g/dL — ABNORMAL LOW (ref 12.0–15.0)
Lymphocytes Relative: 32 %
Lymphs Abs: 3.2 10*3/uL (ref 0.7–4.0)
MCH: 21.1 pg — ABNORMAL LOW (ref 26.0–34.0)
MCHC: 31.4 g/dL (ref 30.0–36.0)
MCV: 67.4 fL — ABNORMAL LOW (ref 78.0–100.0)
MONO ABS: 0.5 10*3/uL (ref 0.1–1.0)
Monocytes Relative: 5 %
NEUTROS ABS: 6.3 10*3/uL (ref 1.7–7.7)
Neutrophils Relative %: 62 %
PLATELETS: 244 10*3/uL (ref 150–400)
RBC: 5.06 MIL/uL (ref 3.87–5.11)
RDW: 16 % — AB (ref 11.5–15.5)
WBC: 10.1 10*3/uL (ref 4.0–10.5)

## 2016-03-12 LAB — TROPONIN I
Troponin I: <0.03 ng/mL (ref <0.031)
Troponin I: <0.03 ng/mL (ref <0.031)

## 2016-03-12 LAB — BRAIN NATRIURETIC PEPTIDE: B Natriuretic Peptide: 42 pg/mL (ref 0.0–100.0)

## 2016-03-12 NOTE — Discharge Instructions (Signed)
Your cardiologist will call tomorrow with a follow-up appointment  Nonspecific Chest Pain  Chest pain can be caused by many different conditions. There is always a chance that your pain could be related to something serious, such as a heart attack or a blood clot in your lungs. Chest pain can also be caused by conditions that are not life-threatening. If you have chest pain, it is very important to follow up with your health care provider. CAUSES  Chest pain can be caused by:  Heartburn.  Pneumonia or bronchitis.  Anxiety or stress.  Inflammation around your heart (pericarditis) or lung (pleuritis or pleurisy).  A blood clot in your lung.  A collapsed lung (pneumothorax). It can develop suddenly on its own (spontaneous pneumothorax) or from trauma to the chest.  Shingles infection (varicella-zoster virus).  Heart attack.  Damage to the bones, muscles, and cartilage that make up your chest wall. This can include:  Bruised bones due to injury.  Strained muscles or cartilage due to frequent or repeated coughing or overwork.  Fracture to one or more ribs.  Sore cartilage due to inflammation (costochondritis). RISK FACTORS  Risk factors for chest pain may include:  Activities that increase your risk for trauma or injury to your chest.  Respiratory infections or conditions that cause frequent coughing.  Medical conditions or overeating that can cause heartburn.  Heart disease or family history of heart disease.  Conditions or health behaviors that increase your risk of developing a blood clot.  Having had chicken pox (varicella zoster). SIGNS AND SYMPTOMS Chest pain can feel like:  Burning or tingling on the surface of your chest or deep in your chest.  Crushing, pressure, aching, or squeezing pain.  Dull or sharp pain that is worse when you move, cough, or take a deep breath.  Pain that is also felt in your back, neck, shoulder, or arm, or pain that spreads to any of  these areas. Your chest pain may come and go, or it may stay constant. DIAGNOSIS Lab tests or other studies may be needed to find the cause of your pain. Your health care provider may have you take a test called an ambulatory ECG (electrocardiogram). An ECG records your heartbeat patterns at the time the test is performed. You may also have other tests, such as:  Transthoracic echocardiogram (TTE). During echocardiography, sound waves are used to create a picture of all of the heart structures and to look at how blood flows through your heart.  Transesophageal echocardiogram (TEE).This is a more advanced imaging test that obtains images from inside your body. It allows your health care provider to see your heart in finer detail.  Cardiac monitoring. This allows your health care provider to monitor your heart rate and rhythm in real time.  Holter monitor. This is a portable device that records your heartbeat and can help to diagnose abnormal heartbeats. It allows your health care provider to track your heart activity for several days, if needed.  Stress tests. These can be done through exercise or by taking medicine that makes your heart beat more quickly.  Blood tests.  Imaging tests. TREATMENT  Your treatment depends on what is causing your chest pain. Treatment may include:  Medicines. These may include:  Acid blockers for heartburn.  Anti-inflammatory medicine.  Pain medicine for inflammatory conditions.  Antibiotic medicine, if an infection is present.  Medicines to dissolve blood clots.  Medicines to treat coronary artery disease.  Supportive care for conditions that do not  require medicines. This may include:  Resting.  Applying heat or cold packs to injured areas.  Limiting activities until pain decreases. HOME CARE INSTRUCTIONS  If you were prescribed an antibiotic medicine, finish it all even if you start to feel better.  Avoid any activities that bring on  chest pain.  Do not use any tobacco products, including cigarettes, chewing tobacco, or electronic cigarettes. If you need help quitting, ask your health care provider.  Do not drink alcohol.  Take medicines only as directed by your health care provider.  Keep all follow-up visits as directed by your health care provider. This is important. This includes any further testing if your chest pain does not go away.  If heartburn is the cause for your chest pain, you may be told to keep your head raised (elevated) while sleeping. This reduces the chance that acid will go from your stomach into your esophagus.  Make lifestyle changes as directed by your health care provider. These may include:  Getting regular exercise. Ask your health care provider to suggest some activities that are safe for you.  Eating a heart-healthy diet. A registered dietitian can help you to learn healthy eating options.  Maintaining a healthy weight.  Managing diabetes, if necessary.  Reducing stress. SEEK MEDICAL CARE IF:  Your chest pain does not go away after treatment.  You have a rash with blisters on your chest.  You have a fever. SEEK IMMEDIATE MEDICAL CARE IF:   Your chest pain is worse.  You have an increasing cough, or you cough up blood.  You have severe abdominal pain.  You have severe weakness.  You faint.  You have chills.  You have sudden, unexplained chest discomfort.  You have sudden, unexplained discomfort in your arms, back, neck, or jaw.  You have shortness of breath at any time.  You suddenly start to sweat, or your skin gets clammy.  You feel nauseous or you vomit.  You suddenly feel light-headed or dizzy.  Your heart begins to beat quickly, or it feels like it is skipping beats. These symptoms may represent a serious problem that is an emergency. Do not wait to see if the symptoms will go away. Get medical help right away. Call your local emergency services (911 in the  [place] Do not drive yourself to the hospital.   This information is not intended to replace advice given to you by your health care provider. Make sure you discuss any questions you have with your health care provider.   Document Released: 06/24/2005 Document Revised: 10/05/2014 Document Reviewed: 04/20/2014 Elsevier Interactive Patient Education [employer]2016 Elsevier Inc.

## 2016-03-12 NOTE — ED Notes (Signed)
Patient transported to X-ray 

## 2016-03-12 NOTE — Telephone Encounter (Signed)
Dr. Delton SeeNelson spoke with ER about this patient last night and recommends an exercise nuclear stress test and f/u arranged with Dr. Herbie BaltimoreHarding. Dx: CP. Please arrange and call patient with appts. She is an established patient with Herbie BaltimoreHarding. Aashir Umholtz PA-C

## 2016-03-13 ENCOUNTER — Other Ambulatory Visit: Payer: Self-pay | Admitting: Cardiology

## 2016-03-13 ENCOUNTER — Other Ambulatory Visit: Payer: Self-pay | Admitting: *Deleted

## 2016-03-13 MED ORDER — NITROGLYCERIN 0.4 MG SL SUBL: SUBLINGUAL_TABLET | SUBLINGUAL | Status: DC

## 2016-03-17 ENCOUNTER — Telehealth (HOSPITAL_COMMUNITY): Payer: Self-pay

## 2016-03-17 NOTE — Telephone Encounter (Signed)
Encounter complete. 

## 2016-03-18 ENCOUNTER — Telehealth (HOSPITAL_COMMUNITY): Payer: Self-pay

## 2016-03-18 NOTE — Telephone Encounter (Signed)
UTR Encounter complete. 

## 2016-03-19 ENCOUNTER — Ambulatory Visit (HOSPITAL_COMMUNITY)
Admission: RE | Admit: 2016-03-19 | Discharge: 2016-03-19 | Disposition: A | Discharge status: Home / Self Care | Payer: Medicare Other | Source: Ambulatory Visit | Attending: Cardiovascular Disease | Admitting: Cardiovascular Disease

## 2016-03-19 DIAGNOSIS — R079 Chest pain, unspecified: Secondary | ICD-10-CM | POA: Diagnosis not present

## 2016-03-19 DIAGNOSIS — R42 Dizziness and giddiness: Secondary | ICD-10-CM | POA: Diagnosis not present

## 2016-03-19 DIAGNOSIS — R5383 Other fatigue: Secondary | ICD-10-CM | POA: Diagnosis not present

## 2016-03-19 DIAGNOSIS — I10 Essential (primary) hypertension: Secondary | ICD-10-CM | POA: Insufficient documentation

## 2016-03-19 DIAGNOSIS — R0602 Shortness of breath: Secondary | ICD-10-CM | POA: Insufficient documentation

## 2016-03-19 LAB — MYOCARDIAL PERFUSION IMAGING
CHL CUP NUCLEAR SSS: 1
CSEPPHR: 87 {beats}/min
LV dias vol: 67 mL (ref 46–106)
LVSYSVOL: 25 mL
Rest HR: 50 {beats}/min
SDS: 1
SRS: 0

## 2016-03-19 MED ORDER — TECHNETIUM TC 99M TETROFOSMIN IV KIT
28.0000 | PACK | Freq: Once | INTRAVENOUS | Status: AC | PRN
  Administered 2016-03-19: 28 via INTRAVENOUS
  Filled 2016-03-19: qty 28

## 2016-03-19 MED ORDER — TECHNETIUM TC 99M TETROFOSMIN IV KIT
10.1000 | PACK | Freq: Once | INTRAVENOUS | Status: AC | PRN
  Administered 2016-03-19: 10.1 via INTRAVENOUS
  Filled 2016-03-19: qty 10

## 2016-03-19 MED ORDER — REGADENOSON 0.4 MG/5ML IV SOLN
0.4000 mg | Freq: Once | INTRAVENOUS | Status: AC
  Administered 2016-03-19: 0.4 mg via INTRAVENOUS

## 2016-03-25 ENCOUNTER — Ambulatory Visit (INDEPENDENT_AMBULATORY_CARE_PROVIDER_SITE_OTHER): Payer: Medicare Other | Admitting: Cardiology

## 2016-03-25 ENCOUNTER — Encounter: Payer: Self-pay | Admitting: Cardiology

## 2016-03-25 VITALS — BP 114/62 | HR 56 | Ht 60.0 in | Wt 121.4 lb

## 2016-03-25 DIAGNOSIS — I214 Non-ST elevation (NSTEMI) myocardial infarction: Secondary | ICD-10-CM | POA: Diagnosis not present

## 2016-03-25 DIAGNOSIS — Z9861 Coronary angioplasty status: Secondary | ICD-10-CM

## 2016-03-25 DIAGNOSIS — R030 Elevated blood-pressure reading, without diagnosis of hypertension: Secondary | ICD-10-CM

## 2016-03-25 DIAGNOSIS — R079 Chest pain, unspecified: Secondary | ICD-10-CM

## 2016-03-25 DIAGNOSIS — I2510 Atherosclerotic heart disease of native coronary artery without angina pectoris: Secondary | ICD-10-CM

## 2016-03-25 DIAGNOSIS — E785 Hyperlipidemia, unspecified: Secondary | ICD-10-CM

## 2016-03-25 NOTE — Patient Instructions (Signed)
Medication Instructions:  Your physician recommends that you continue on your current medications as directed. Please refer to the Current Medication list given to you today.  Labwork: none  Testing/Procedures: none  Follow-Up: Your physician wants you to follow-up in: 6 month ov with Dr Harding You will receive a reminder letter in the mail two months in advance. If you don't receive a letter, please call our office to schedule the follow-up appointment.  If you need a refill on your cardiac medications before your next appointment, please call your pharmacy.  

## 2016-03-25 NOTE — Assessment & Plan Note (Signed)
Seen today as a f/u from an ED visit 03/11/16- Myoview low risk, no further symptoms.

## 2016-03-25 NOTE — Assessment & Plan Note (Signed)
Low risk Myoview 03/19/16

## 2016-03-25 NOTE — Assessment & Plan Note (Signed)
Controlled.  

## 2016-03-25 NOTE — Assessment & Plan Note (Signed)
Followed by PCP

## 2016-03-25 NOTE — Progress Notes (Signed)
03/25/2016 Tammy Murillo   10/15/1945  [id number]  Primary Physician Dorrene GermanEdwin A Avbuere, MD Primary Cardiologist: Dr Herbie BaltimoreHarding  HPI:  70 y/o [place] female with a history of a NSTEMI in 2013 treated with LAD PCI with DES. She is followed by Dr Herbie BaltimoreHarding. She recently lost her husband in March. The pt's daughter says she occasionally gets SOB. She was seen in the ED 03/11/16 with "chest pain" per ED records, though the daughter says it was SOB. Her Troponin were negative. She had an OP Myoview 03/14/16 which was low risk. She is in the office today for follow up. She has had no further chest pain or SOB- her daughter interpreted for me during the exam.    Current Outpatient Prescriptions  Medication Sig Dispense Refill  . acetaminophen (TYLENOL) 325 MG tablet Take 650 mg by mouth as needed for pain.    Marland Kitchen. atorvastatin (LIPITOR) 40 MG tablet TAKE 1 TABLET BY MOUTH EVERY DAY AT 6 PM 30 tablet 4  . esomeprazole (NEXIUM) 40 MG capsule TAKE ONE CAPSULE BY MOUTH EVERY DAY BEFORE BREAKFAST 30 capsule 10  . isosorbide mononitrate (IMDUR) 60 MG 24 hr tablet Take 1 tablet (60 mg total) by mouth daily. 90 tablet 3  . metoprolol tartrate (LOPRESSOR) 25 MG tablet TAKE 1 TABLET BY MOUTH TWICE A DAY 60 tablet 10  . nitroGLYCERIN (NITROSTAT) 0.4 MG SL tablet TAKE 1 TABLET UNDER THE TONGUE, EVERY 5 MINUETS FOR 3 DOSES, AS NEEDED FOR CHEST PAIN AS DIRECTED 25 tablet 1  . ticagrelor (BRILINTA) 90 MG TABS tablet Take 1 tablet (90 mg total) by mouth 2 (two) times daily. NEED OV. 60 tablet 6  . tiZANidine (ZANAFLEX) 4 MG tablet Take 4 mg by mouth at bedtime as needed.  2  . VOLTAREN 1 % GEL Apply 1 application topically 4 (four) times daily as needed.  5  . ZETIA 10 MG tablet TAKE 1 TABLET BY MOUTH EVERY DAY 30 tablet 10   No current facility-administered medications for this visit.    No Known Allergies  Social History   Social History  . Marital Status: Married    Spouse Name: N/A  . Number of Children: N/A    . Years of Education: N/A   Occupational History  . Not on file.   Social History Main Topics  . Smoking status: Never Smoker   . Smokeless tobacco: Never Used  . Alcohol Use: No  . Drug Use: No  . Sexual Activity: No   Other Topics Concern  . Not on file   Social History Narrative   She is a native [place] woman. She is married with 5 children and 7 grandchildren. One of her daughters or her granddaughters usually comes as an Equities traderinterpreter. She does not speak any [place].   The granddaughter with her today notes that the patient is significantly prone to anxiety and stress related to family issues. Most of her symptoms are related to stressful situations in house.   She does not smoke or drink. She does not routinely exercise.     Review of Systems: General: negative for chills, fever, night sweats or weight changes.  Cardiovascular: negative for chest pain, dyspnea on exertion, edema, orthopnea, palpitations, paroxysmal nocturnal dyspnea or shortness of breath Dermatological: negative for rash Respiratory: negative for cough or wheezing Urologic: negative for hematuria Abdominal: negative for nausea, vomiting, diarrhea, bright red blood per rectum, melena, or hematemesis Neurologic: negative for visual changes, syncope, or dizziness All other  systems reviewed and are otherwise negative except as noted above.    Blood pressure 114/62, pulse 56, height 5' (1.524 m), weight 121 lb 6.4 oz (55.067 kg).  General appearance: alert, cooperative and no distress Neck: no carotid bruit and no JVD Lungs: clear to auscultation bilaterally Heart: regular rate and rhythm Extremities: no edema Skin: Skin color, texture, turgor normal. No rashes or lesions Neurologic: Grossly normal  EKG NSR TWI V2 and V3- old  ASSESSMENT AND PLAN:   NSTEMI, April 2013 .  CAD S/P percutaneous coronary angioplasty - DES to LAD April 2013 Low risk Myoview 03/19/16  Borderline  hypertension Controlled  Dyslipidemia, goal LDL below 70 Followed by PCP  Chest pain with low risk for cardiac etiology Seen today as a f/u from an ED visit 03/11/16- Myoview low risk, no further symptoms.   PLAN  No change in her Rx- f/u Dr Herbie Baltimoreharding in 6 months or sooner if she has recurrent chest pain or dyspnea.Corine Shelter.   Chirstopher Iovino PA-C 03/25/2016 11:00 AM

## 2016-04-23 ENCOUNTER — Other Ambulatory Visit: Payer: Self-pay | Admitting: *Deleted

## 2016-04-30 ENCOUNTER — Ambulatory Visit: Payer: Medicare Other | Admitting: Cardiology

## 2016-05-12 ENCOUNTER — Other Ambulatory Visit: Payer: Self-pay | Admitting: Cardiology

## 2016-07-11 ENCOUNTER — Other Ambulatory Visit: Payer: Self-pay | Admitting: Cardiology

## 2016-10-02 ENCOUNTER — Other Ambulatory Visit: Payer: Self-pay | Admitting: *Deleted

## 2016-10-02 MED ORDER — ATORVASTATIN CALCIUM 40 MG PO TABS: ORAL_TABLET | ORAL | 3 refills | Status: DC

## 2016-11-06 ENCOUNTER — Encounter: Payer: Self-pay | Admitting: Cardiology

## 2016-11-06 ENCOUNTER — Ambulatory Visit (INDEPENDENT_AMBULATORY_CARE_PROVIDER_SITE_OTHER): Payer: Medicare Other | Admitting: Cardiology

## 2016-11-06 VITALS — BP 113/72 | HR 68 | Ht 60.0 in | Wt 122.4 lb

## 2016-11-06 DIAGNOSIS — I214 Non-ST elevation (NSTEMI) myocardial infarction: Secondary | ICD-10-CM | POA: Diagnosis not present

## 2016-11-06 DIAGNOSIS — Z9861 Coronary angioplasty status: Secondary | ICD-10-CM | POA: Diagnosis not present

## 2016-11-06 DIAGNOSIS — R079 Chest pain, unspecified: Secondary | ICD-10-CM | POA: Diagnosis not present

## 2016-11-06 DIAGNOSIS — E785 Hyperlipidemia, unspecified: Secondary | ICD-10-CM

## 2016-11-06 DIAGNOSIS — R030 Elevated blood-pressure reading, without diagnosis of hypertension: Secondary | ICD-10-CM | POA: Diagnosis not present

## 2016-11-06 DIAGNOSIS — I2510 Atherosclerotic heart disease of native coronary artery without angina pectoris: Secondary | ICD-10-CM | POA: Diagnosis not present

## 2016-11-06 MED ORDER — ASPIRIN EC 81 MG PO TBEC: 81.0000 mg | DELAYED_RELEASE_TABLET | Freq: Every day | ORAL | 3 refills | Status: DC

## 2016-11-06 NOTE — Patient Instructions (Addendum)
MEDICATIONS TAKE BRILINTA UNTIL Sunday , THEN STOP ALL TOGETHER . START TAKING ASPIRIN ENTERIC COATED -81 MG ONE TABLET BY MOUTH DAILY.    IF YOU HAVE PAIN LIKE YOUR HEART ATTACK PAIN  TAKE 4 ASPIRIN ( CHEW THEM) AND 1 TABLET OF THE BRILINTA AND NITROGLYCERIN TABLET ( UNDER YOUR TONGUE)  IF PAIN DOES NOT GO AWAY IN 5 MINUTES TAKE ANOTHER NITROGLYCERIN  AND IF GOES AWAY CALL OFFICE IF PATIENT  DOES NOT GO AWAY TAKE ANOTHER NITRGLYCERIN TABLET AND CALL 911 ,SO EMS CAN TAKE YOU TO THE HOSPITAL   Your physician wants you to follow-up in 12 MONTHS WITH DR HARDING. You will receive a reminder letter in the mail two months in advance. If you don't receive a letter, please call our office to schedule the follow-up appointment.   If you need a refill on your cardiac medications before your next appointment, please call your pharmacy.

## 2016-11-06 NOTE — Progress Notes (Signed)
PCP: Dorrene German, MD  Clinic Note: Chief Complaint  Patient presents with  . Follow-up    8 months; CAD status post non-STEMI with PCI to the LAD    HPI: Tammy Murillo is a 71 y.o. female with a PMH below who presents today for Delayed six-month follow-up for CAD-PCI.Tammy Murillo was last seen in June 2017 as hospital f/u by Corine Shelter, PA.  Recent Hospitalizations: ER 02/2016 for CP - r/o MI --> Myoview  Studies Reviewed:   Myoview 02/2016: LOW RISK study. No ischemia or infarction. EF 60-65%.  History taken with the assistance of Contracted [place] interpreter.  Interval History: She presents today doing well - excited about her recent trip back to [place].  As is usually the case, she still has her intermittent chest and shoulder discomfort that she has when she is tired or stressed. Usually this occurs the following day. This is the very similar symptom that she had and was evaluated with a negative stress test prior to her MI. The symptom itself does not sound anginal in nature. Her anginal symptom was substernal chest pressure with nausea and dyspnea etc. She has not had any of that. I again clarified for her the difference between 2 symptoms.  Other than this likely musculoskeletal type chest pain, she has not had any resting or exertional anginal type chest pain.  No exertional dyspnea, PND, orthopnea or edema. She may feel a few flip-flops or heart, but no rapid irregular heartbeats or palpitations. No syncope/near syncope, TIAs amaurosis fugax.  She is quite active overall and has had no problems with her trips. She does lots of walking around during her drips.  ROS: A comprehensive was performed. Review of Systems  Constitutional: Negative for malaise/fatigue.  HENT: Negative for congestion and nosebleeds.   Respiratory: Negative for cough and shortness of breath.   Cardiovascular: Negative.        Per history of present illness  Gastrointestinal: Negative for  abdominal pain, blood in stool, constipation, heartburn and melena.  Genitourinary: Negative for hematuria.  Musculoskeletal: Negative.   Neurological: Negative.  Negative for dizziness.  Endo/Heme/Allergies: Bruises/bleeds easily.  Psychiatric/Behavioral: Negative for memory loss. The patient is not nervous/anxious and does not have insomnia.   All other systems reviewed and are negative.   Past Medical History:  Diagnosis Date  . Anxiety   . Borderline hypertension   . CAD S/P percutaneous coronary angioplasty 12/31/2011   3 sequential LAD lesions treated with a single Promus Element DES; - Myoview 02/2016: No infarct or ischemia. EF 60-65%.  . Costochondritis   . Dyslipidemia, goal LDL below 70   . H/O echocardiogram April 2014   Normal EF: 55-60%, no regional WMA, grade 1 diastolic dysfunction. Mild MR  . History of colon cancer  2010  . History of: Non-ST elevation MI (NSTEMI) 12/31/2011   Severe LAD lesions: Sequential 90%, 70% and 60%; no other significant stenoses. She had had a normal Myoview in 2012.  . Osteoarthritis   . Presence of drug coated stent in LAD coronary artery 01/01/2012   Promus Element DES 2.75 mm x38 mm postdilated to 3.0 mm    Past Surgical History:  Procedure Laterality Date  . CHOLECYSTECTOMY  2004  . CORONARY ANGIOPLASTY WITH STENT PLACEMENT  01/01/2012   Promus Element DES 2.75 MM by 30 MM, postdilated to 3.0 MM  . LEFT HEART CATHETERIZATION WITH CORONARY ANGIOGRAM N/A 01/01/2012   Procedure: LEFT HEART CATHETERIZATION WITH CORONARY  ANGIOGRAM;  Surgeon: Lennette Biharihomas A Kelly, MD;  Location: [facility] CATH LAB;  Service: Cardiovascular;  Laterality: N/A;  . NM MYOVIEW LTD  September 2014   No ischemia or infarction. EF 70%  . NM MYOVIEW LTD  02/2016    LOW RISK study. No ischemia or infarction. EF 60-65%.  Marland Kitchen. PERCUTANEOUS CORONARY STENT INTERVENTION (PCI-S)  01/01/2012   Procedure: PERCUTANEOUS CORONARY STENT INTERVENTION (PCI-S);  Surgeon: Lennette Biharihomas A Kelly, MD;  [facility];  Service: Cardiovascular;;  . TRANSTHORACIC ECHOCARDIOGRAM  April 2014   EF 55%. Grade 1 diastolic dysfunction. Mild MR    Current Meds  Medication Sig  . acetaminophen (TYLENOL) 325 MG tablet Take 650 mg by mouth as needed for pain.  Marland Kitchen. atorvastatin (LIPITOR) 40 MG tablet TAKE 1 TABLET BY MOUTH EVERY DAY AT 6 PM  . esomeprazole (NEXIUM) 40 MG capsule TAKE ONE CAPSULE BY MOUTH EVERY DAY BEFORE BREAKFAST  . isosorbide mononitrate (IMDUR) 60 MG 24 hr tablet Take 1 tablet (60 mg total) by mouth daily.  . metoprolol tartrate (LOPRESSOR) 25 MG tablet TAKE 1 TABLET BY MOUTH TWICE A DAY  . nitroGLYCERIN (NITROSTAT) 0.4 MG SL tablet TAKE 1 TABLET UNDER THE TONGUE, EVERY 5 MINUETS FOR 3 DOSES, AS NEEDED FOR CHEST PAIN AS DIRECTED  . tiZANidine (ZANAFLEX) 4 MG tablet Take 4 mg by mouth at bedtime as needed.  . VOLTAREN 1 % GEL Apply 1 application topically 4 (four) times daily as needed.  Marland Kitchen. ZETIA 10 MG tablet TAKE 1 TABLET BY MOUTH EVERY DAY  . [DISCONTINUED] BRILINTA 90 MG TABS tablet TAKE 1 TABLET BY MOUTH 2 TIMES DAILY. NEED OV.    No Known Allergies  Social History   Social History  . Marital status: Married    Spouse name: N/A  . Number of children: N/A  . Years of education: N/A   Social History Main Topics  . Smoking status: Never Smoker  . Smokeless tobacco: Never Used  . Alcohol use No  . Drug use: No  . Sexual activity: No   Other Topics Concern  . None   Social History Narrative   She is a native [place] woman. She is married with 5 children and 7 grandchildren. One of her daughters or her granddaughters usually comes as an Equities traderinterpreter. She does not speak any [place].   The granddaughter with her today notes that the patient is significantly prone to anxiety and stress related to family issues. Most of her symptoms are related to stressful situations in house.   She does not smoke or drink. She does not routinely exercise.    family history includes  Coronary artery disease in her mother.  Wt Readings from Last 3 Encounters:  11/06/16 55.5 kg (122 lb 6.4 oz)  03/25/16 55.1 kg (121 lb 6.4 oz)  03/19/16 55.3 kg (122 lb)    PHYSICAL EXAM BP 113/72   Pulse 68   Ht 5' (1.524 m)   Wt 55.5 kg (122 lb 6.4 oz)   BMI 23.90 kg/m  General appearance: alert, cooperative, appears stated age, no distress; the last [place]. She does seem to answer her granddaughter's questions directly. Well-nourished well-groomed. HEENT: [place]/AT, EOMI, MMM, anicteric sclera Neck: Supple, no adenopathy, no carotid bruit and no JVD Lungs: clear to auscultation bilaterally, normal percussion bilaterally and non-labored Heart: regular rate and rhythm, S1, S2 normal, no click, rub or gallop; 1/6 SEM crescendo-decrescendo @ RUSB  Abdomen: soft, non-tender; bowel sounds normal; no masses, no organomegaly; Extremities:  extremities normal, atraumatic, no cyanosis, OR edema Pulses: 2+ and symmetric; Neurologic: Mental status: Alert, oriented, thought content appropriate; there Cranial nerves: normal (II-XII grossly intact) Psych: normal, pleasant mood & affect   Adult ECG Report  Rate: 68 ;  Rhythm: normal sinus rhythm and non-specific ST-T wave changes.  Otherwise normal axis, intervals & durations;   Narrative Interpretation: stable EKG   Other studies Reviewed: Additional studies/ records that were reviewed today include:  Recent Labs:   Lab Results  Component Value Date   CHOL 123 01/24/2015   HDL 38 (L) 01/24/2015   LDLCALC 59 01/24/2015   TRIG 131 01/24/2015   CHOLHDL 3.2 01/24/2015     ASSESSMENT / PLAN: Problem List Items Addressed This Visit    CAD S/P percutaneous coronary angioplasty - DES to LAD April 2013 - Primary (Chronic)    No significant anginal type chest pain. Similar muscular skeletal type chest pain that was ruled out for MI and had a negative Myoview back in June of last year. She is almost 5 years out from her MI and PCI.  We can  stop Brilinta and this returned a baby aspirin.  She is on statin and metoprolol along with Imdur.      Relevant Medications   aspirin EC 81 MG tablet   Other Relevant Orders   EKG 12-Lead   NSTEMI, April 2013 (Chronic)    This was a clear anginal symptoms that is different than the atypical symptoms that she constantly notes. It is very important to use interpreted asked the question about the type of age she had when she had her MI. He has not had any further episodes of this. She is on Imdur and metoprolol. Has not had any heart failure symptoms.      Relevant Medications   aspirin EC 81 MG tablet   Borderline hypertension (Chronic)    Stable on low-dose beta blocker      Relevant Orders   EKG 12-Lead   Dyslipidemia, goal LDL below 70 (Chronic)    Followed by PCP. Last check SL was in April 2016 well-controlled at bedtime. Is stable now on atorvastatin plus Zetia.      Relevant Medications   aspirin EC 81 MG tablet   Chest pain with low risk for cardiac etiology    She has this discomfort across the top of her shoulders and upper chest that occurs when she is fatigued or stressed. He usually also have the day after feeling stressed or fatigued. This is not consistent with her anginal pain. It has been evaluated twice with stress tests that have been negative.         Current medicines are reviewed at length with the patient today. (+/- concerns) n/a The following changes have been made: n/a  Patient Instructions  MEDICATIONS TAKE BRILINTA UNTIL Sunday , THEN STOP ALL TOGETHER . START TAKING ASPIRIN ENTERIC COATED -81 MG ONE TABLET BY MOUTH DAILY.    IF YOU HAVE PAIN LIKE YOUR HEART ATTACK PAIN  TAKE 4 ASPIRIN ( CHEW THEM) AND 1 TABLET OF THE BRILINTA AND NITROGLYCERIN TABLET ( UNDER YOUR TONGUE)  IF PAIN DOES NOT GO AWAY IN 5 MINUTES TAKE ANOTHER NITROGLYCERIN  AND IF GOES AWAY CALL OFFICE IF PATIENT  DOES NOT GO AWAY TAKE ANOTHER NITRGLYCERIN TABLET AND CALL 911  ,SO EMS CAN TAKE YOU TO THE HOSPITAL   Your physician wants you to follow-up in 12 MONTHS WITH DR HARDING. You will receive a reminder letter  in the mail two months in advance. If you don't receive a letter, please call our office to schedule the follow-up appointment.   If you need a refill on your cardiac medications before your next appointment, please call your pharmacy.    Studies Ordered:   Orders Placed This Encounter  Procedures  . EKG 12-Lead      Bryan Lemma, M.D., M.S. Interventional Cardiologist   Pager # [phone] Phone # [phone] [address] [place], [place] [address]

## 2016-11-08 ENCOUNTER — Encounter: Payer: Self-pay | Admitting: Cardiology

## 2016-11-08 NOTE — Assessment & Plan Note (Signed)
No significant anginal type chest pain. Similar muscular skeletal type chest pain that was ruled out for MI and had a negative Myoview back in June of last year. She is almost 5 years out from her MI and PCI.  We can stop Brilinta and this returned a baby aspirin.  She is on statin and metoprolol along with Imdur.

## 2016-11-08 NOTE — Assessment & Plan Note (Signed)
She has this discomfort across the top of her shoulders and upper chest that occurs when she is fatigued or stressed. He usually also have the day after feeling stressed or fatigued. This is not consistent with her anginal pain. It has been evaluated twice with stress tests that have been negative.

## 2016-11-08 NOTE — Assessment & Plan Note (Signed)
Stable on low dose beta blocker 

## 2016-11-08 NOTE — Assessment & Plan Note (Signed)
This was a clear anginal symptoms that is different than the atypical symptoms that she constantly notes. It is very important to use interpreted asked the question about the type of age she had when she had her MI. He has not had any further episodes of this. She is on Imdur and metoprolol. Has not had any heart failure symptoms.

## 2016-11-08 NOTE — Assessment & Plan Note (Signed)
Followed by PCP. Last check SL was in April 2016 well-controlled at bedtime. Is stable now on atorvastatin plus Zetia.

## 2017-01-13 ENCOUNTER — Other Ambulatory Visit: Payer: Self-pay | Admitting: Cardiology

## 2017-01-19 DIAGNOSIS — Z1239 Encounter for other screening for malignant neoplasm of breast: Secondary | ICD-10-CM | POA: Diagnosis not present

## 2017-01-19 DIAGNOSIS — M25519 Pain in unspecified shoulder: Secondary | ICD-10-CM | POA: Diagnosis not present

## 2017-01-19 DIAGNOSIS — E784 Other hyperlipidemia: Secondary | ICD-10-CM | POA: Diagnosis not present

## 2017-01-19 DIAGNOSIS — I2510 Atherosclerotic heart disease of native coronary artery without angina pectoris: Secondary | ICD-10-CM | POA: Diagnosis not present

## 2017-01-19 DIAGNOSIS — M5136 Other intervertebral disc degeneration, lumbar region: Secondary | ICD-10-CM | POA: Diagnosis not present

## 2017-01-19 DIAGNOSIS — Z131 Encounter for screening for diabetes mellitus: Secondary | ICD-10-CM | POA: Diagnosis not present

## 2017-02-02 ENCOUNTER — Other Ambulatory Visit: Payer: Self-pay | Admitting: Cardiology

## 2017-02-02 NOTE — Telephone Encounter (Signed)
Rx(s) sent to pharmacy electronically.  

## 2017-03-14 ENCOUNTER — Other Ambulatory Visit: Payer: Self-pay | Admitting: Cardiology

## 2017-06-10 ENCOUNTER — Other Ambulatory Visit: Payer: Self-pay | Admitting: Cardiology

## 2017-06-28 HISTORY — PX: TRANSTHORACIC ECHOCARDIOGRAM: SHX275

## 2017-07-14 ENCOUNTER — Emergency Department (HOSPITAL_COMMUNITY): Payer: Medicare Other

## 2017-07-14 ENCOUNTER — Encounter (HOSPITAL_COMMUNITY): Payer: Self-pay

## 2017-07-14 ENCOUNTER — Inpatient Hospital Stay (HOSPITAL_COMMUNITY)
Admission: EM | Admit: 2017-07-14 | Discharge: 2017-07-17 | DRG: 866 | Disposition: A | Discharge status: Home / Self Care | Payer: Medicare Other | Attending: Internal Medicine | Admitting: Internal Medicine

## 2017-07-14 DIAGNOSIS — Z955 Presence of coronary angioplasty implant and graft: Secondary | ICD-10-CM

## 2017-07-14 DIAGNOSIS — Z9049 Acquired absence of other specified parts of digestive tract: Secondary | ICD-10-CM

## 2017-07-14 DIAGNOSIS — D649 Anemia, unspecified: Secondary | ICD-10-CM | POA: Diagnosis present

## 2017-07-14 DIAGNOSIS — D61818 Other pancytopenia: Secondary | ICD-10-CM | POA: Diagnosis present

## 2017-07-14 DIAGNOSIS — D696 Thrombocytopenia, unspecified: Secondary | ICD-10-CM | POA: Diagnosis present

## 2017-07-14 DIAGNOSIS — I5032 Chronic diastolic (congestive) heart failure: Secondary | ICD-10-CM | POA: Diagnosis not present

## 2017-07-14 DIAGNOSIS — R51 Headache: Secondary | ICD-10-CM

## 2017-07-14 DIAGNOSIS — I110 Hypertensive heart disease with heart failure: Secondary | ICD-10-CM | POA: Diagnosis present

## 2017-07-14 DIAGNOSIS — I252 Old myocardial infarction: Secondary | ICD-10-CM

## 2017-07-14 DIAGNOSIS — R042 Hemoptysis: Secondary | ICD-10-CM | POA: Diagnosis present

## 2017-07-14 DIAGNOSIS — B349 Viral infection, unspecified: Secondary | ICD-10-CM | POA: Diagnosis not present

## 2017-07-14 DIAGNOSIS — D72819 Decreased white blood cell count, unspecified: Secondary | ICD-10-CM | POA: Diagnosis present

## 2017-07-14 DIAGNOSIS — M1990 Unspecified osteoarthritis, unspecified site: Secondary | ICD-10-CM | POA: Diagnosis present

## 2017-07-14 DIAGNOSIS — Z8249 Family history of ischemic heart disease and other diseases of the circulatory system: Secondary | ICD-10-CM

## 2017-07-14 DIAGNOSIS — I517 Cardiomegaly: Secondary | ICD-10-CM | POA: Diagnosis present

## 2017-07-14 DIAGNOSIS — K219 Gastro-esophageal reflux disease without esophagitis: Secondary | ICD-10-CM

## 2017-07-14 DIAGNOSIS — Z9861 Coronary angioplasty status: Secondary | ICD-10-CM

## 2017-07-14 DIAGNOSIS — R4182 Altered mental status, unspecified: Secondary | ICD-10-CM | POA: Diagnosis not present

## 2017-07-14 DIAGNOSIS — E785 Hyperlipidemia, unspecified: Secondary | ICD-10-CM | POA: Diagnosis present

## 2017-07-14 DIAGNOSIS — R05 Cough: Secondary | ICD-10-CM | POA: Diagnosis not present

## 2017-07-14 DIAGNOSIS — I2510 Atherosclerotic heart disease of native coronary artery without angina pectoris: Secondary | ICD-10-CM | POA: Diagnosis present

## 2017-07-14 DIAGNOSIS — R6510 Systemic inflammatory response syndrome (SIRS) of non-infectious origin without acute organ dysfunction: Secondary | ICD-10-CM | POA: Diagnosis present

## 2017-07-14 DIAGNOSIS — Z85038 Personal history of other malignant neoplasm of large intestine: Secondary | ICD-10-CM

## 2017-07-14 DIAGNOSIS — R519 Headache, unspecified: Secondary | ICD-10-CM

## 2017-07-14 LAB — COMPREHENSIVE METABOLIC PANEL
ALK PHOS: 63 U/L (ref 38–126)
ALT: 51 U/L (ref 14–54)
AST: 78 U/L — AB (ref 15–41)
Albumin: 3.7 g/dL (ref 3.5–5.0)
Anion gap: 10 (ref 5–15)
BUN: 20 mg/dL (ref 6–20)
CALCIUM: 8.6 mg/dL — AB (ref 8.9–10.3)
CO2: 22 mmol/L (ref 22–32)
CREATININE: 0.56 mg/dL (ref 0.44–1.00)
Chloride: 109 mmol/L (ref 101–111)
GFR calc Af Amer: >60 mL/min (ref >60)
GLUCOSE: 111 mg/dL — AB (ref 65–99)
Potassium: 4 mmol/L (ref 3.5–5.1)
SODIUM: 141 mmol/L (ref 135–145)
TOTAL PROTEIN: 7 g/dL (ref 6.5–8.1)
Total Bilirubin: 0.5 mg/dL (ref 0.3–1.2)

## 2017-07-14 LAB — CBC
HCT: 34.1 % — ABNORMAL LOW (ref 36.0–46.0)
Hemoglobin: 11.1 g/dL — ABNORMAL LOW (ref 12.0–15.0)
MCH: 20.9 pg — AB (ref 26.0–34.0)
MCHC: 32.6 g/dL (ref 30.0–36.0)
MCV: 64.3 fL — ABNORMAL LOW (ref 78.0–100.0)
PLATELETS: 94 10*3/uL — AB (ref 150–400)
RBC: 5.3 MIL/uL — ABNORMAL HIGH (ref 3.87–5.11)
RDW: 15.4 % (ref 11.5–15.5)
WBC: 3.4 10*3/uL — ABNORMAL LOW (ref 4.0–10.5)

## 2017-07-14 LAB — PROTIME-INR
INR: 0.85
Prothrombin Time: 11.6 seconds (ref 11.4–15.2)

## 2017-07-14 LAB — LIPASE, BLOOD: LIPASE: 43 U/L (ref 11–51)

## 2017-07-14 LAB — I-STAT CG4 LACTIC ACID, ED: LACTIC ACID, VENOUS: 1.11 mmol/L (ref 0.5–1.9)

## 2017-07-14 MED ORDER — DIPHENHYDRAMINE HCL 50 MG/ML IJ SOLN
12.5000 mg | Freq: Once | INTRAMUSCULAR | Status: AC
  Administered 2017-07-14: 12.5 mg via INTRAVENOUS
  Filled 2017-07-14: qty 1

## 2017-07-14 MED ORDER — ACETAMINOPHEN 325 MG PO TABS
650.0000 mg | ORAL_TABLET | Freq: Once | ORAL | Status: AC
  Administered 2017-07-14: 650 mg via ORAL
  Filled 2017-07-14: qty 2

## 2017-07-14 MED ORDER — SODIUM CHLORIDE 0.9 % IV BOLUS (SEPSIS)
1000.0000 mL | Freq: Once | INTRAVENOUS | Status: DC
  Administered 2017-07-14: 1000 mL via INTRAVENOUS

## 2017-07-14 MED ORDER — METOCLOPRAMIDE HCL 5 MG/ML IJ SOLN
5.0000 mg | Freq: Once | INTRAMUSCULAR | Status: AC
  Administered 2017-07-14: 5 mg via INTRAVENOUS
  Filled 2017-07-14: qty 2

## 2017-07-14 MED ORDER — SODIUM CHLORIDE 0.9 % IV BOLUS (SEPSIS)
1000.0000 mL | Freq: Once | INTRAVENOUS | Status: AC
  Administered 2017-07-14: 1000 mL via INTRAVENOUS

## 2017-07-14 MED ORDER — SODIUM CHLORIDE 0.9 % IV BOLUS (SEPSIS)
1000.0000 mL | Freq: Once | INTRAVENOUS | Status: AC
  Administered 2017-07-15: 1000 mL via INTRAVENOUS

## 2017-07-14 NOTE — ED Provider Notes (Signed)
[facility] Provider Note   CSN: [id number] Arrival date & time: 07/14/17  1515     History   Chief Complaint Chief Complaint  Patient presents with  . Nausea  . Headache  . Diarrhea    HPI Tammy Murillo is a 71 y.o. female.  HPI   71 yo F with PMHx of CAD here with fever, nausea, headache, and vomiting. Pt recently visited [place] and returned home last week. Since then, she has had headache, fever, chills, nausea, and vomiting. Her sx all started together. HA is generalized. She has not had any associated photophobia or phonophobia. She does endorse some soreness in her neck but this improves with movement. She's had associated cough, no sputum production. She has also been vomiting and has been unable to eat/drink due to this nausea. She has had poor PO intake. She endorses generalized body aches and chills with this. No sick contacts at home. No known tick bites. She did sustain mosquito bites in [place].  Past Medical History:  Diagnosis Date  . Anxiety   . Borderline hypertension   . CAD S/P percutaneous coronary angioplasty 12/31/2011   3 sequential LAD lesions treated with a single Promus Element DES; - Myoview 02/2016: No infarct or ischemia. EF 60-65%.  . Costochondritis   . Dyslipidemia, goal LDL below 70   . H/O echocardiogram April 2014   Normal EF: 55-60%, no regional WMA, grade 1 diastolic dysfunction. Mild MR  . History of colon cancer  2010  . History of: Non-ST elevation MI (NSTEMI) 12/31/2011   Severe LAD lesions: Sequential 90%, 70% and 60%; no other significant stenoses. She had had a normal Myoview in 2012.  . Osteoarthritis   . Presence of drug coated stent in LAD coronary artery 01/01/2012   Promus Element DES 2.75 mm x38 mm postdilated to 3.0 mm    Patient Active Problem List   Diagnosis Date Noted  . Thrombocytopenia (HCC) 07/15/2017  . Leucopenia 07/15/2017  . Pancytopenia (HCC) 07/15/2017  . Cardiomegaly  07/15/2017  . Anemia 07/15/2017  . Chronic diastolic CHF (congestive heart failure) (HCC) 07/15/2017  . SIRS (systemic inflammatory response syndrome) (HCC) 07/15/2017  . Chest pain with low risk for cardiac etiology 12/07/2013  . NSTEMI, April 2013 01/03/2012  . CAD S/P percutaneous coronary angioplasty - DES to LAD April 2013 01/01/2012  . Borderline hypertension 12/31/2011  . Dyslipidemia, goal LDL below 70 12/31/2011    Past Surgical History:  Procedure Laterality Date  . CHOLECYSTECTOMY  2004  . CORONARY ANGIOPLASTY WITH STENT PLACEMENT  01/01/2012   Promus Element DES 2.75 MM by 30 MM, postdilated to 3.0 MM  . LEFT HEART CATHETERIZATION WITH CORONARY ANGIOGRAM N/A 01/01/2012   Procedure: LEFT HEART CATHETERIZATION WITH CORONARY ANGIOGRAM;  Surgeon: Lennette Bihari, MD;  Location: [facility] CATH LAB;  Service: Cardiovascular;  Laterality: N/A;  . NM MYOVIEW LTD  September 2014   No ischemia or infarction. EF 70%  . NM MYOVIEW LTD  02/2016    LOW RISK study. No ischemia or infarction. EF 60-65%.  Marland Kitchen PERCUTANEOUS CORONARY STENT INTERVENTION (PCI-S)  01/01/2012   Procedure: PERCUTANEOUS CORONARY STENT INTERVENTION (PCI-S);  Surgeon: Lennette Bihari, MD;  Location: [facility] CATH LAB;  Service: Cardiovascular;;  . TRANSTHORACIC ECHOCARDIOGRAM  April 2014   EF 55%. Grade 1 diastolic dysfunction. Mild MR    OB History    No data available       Home Medications    Prior  to Admission medications   Medication Sig Start Date End Date Taking? Authorizing Provider  acetaminophen (TYLENOL) 325 MG tablet Take 650 mg by mouth as needed for pain.    [provider]  aspirin EC 81 MG tablet Take 1 tablet (81 mg total) by mouth daily. 11/06/16   Marykay Lex, MD  atorvastatin (LIPITOR) 40 MG tablet TAKE 1 TABLET BY MOUTH EVERY DAY AT 6 PM 02/02/17   Marykay Lex, MD  esomeprazole (NEXIUM) 40 MG capsule TAKE ONE CAPSULE BY MOUTH EVERY DAY BEFORE BREAKFAST 06/10/17   Marykay Lex, MD    isosorbide mononitrate (IMDUR) 60 MG 24 hr tablet Take 1 tablet (60 mg total) by mouth daily. 10/17/15   Marykay Lex, MD  metoprolol tartrate (LOPRESSOR) 25 MG tablet TAKE 1 TABLET BY MOUTH TWICE A DAY 03/15/17   Marykay Lex, MD  nitroGLYCERIN (NITROSTAT) 0.4 MG SL tablet TAKE 1 TABLET UNDER THE TONGUE, EVERY 5 MINUETS FOR 3 DOSES, AS NEEDED FOR CHEST PAIN AS DIRECTED 03/13/16   Leone Brand, NP  tiZANidine (ZANAFLEX) 4 MG tablet Take 4 mg by mouth at bedtime as needed. 01/08/15   [provider]  VOLTAREN 1 % GEL Apply 1 application topically 4 (four) times daily as needed. 12/26/14   [provider]  ZETIA 10 MG tablet TAKE 1 TABLET BY MOUTH EVERY DAY 02/18/16   Marykay Lex, MD    Family History Family History  Problem Relation Age of Onset  . Coronary artery disease Mother     Social History Social History  Substance Use Topics  . Smoking status: Never Smoker  . Smokeless tobacco: Never Used  . Alcohol use No     Allergies   Patient has no known allergies.   Review of Systems Review of Systems  Constitutional: Positive for chills and fever.  Respiratory: Positive for cough.   Gastrointestinal: Positive for abdominal pain and nausea.  Neurological: Positive for weakness and headaches.  All other systems reviewed and are negative.    Physical Exam Updated Vital Signs BP 114/81 (BP Location: Left Arm)   Pulse (!) 56   Temp 99.6 F (37.6 C) (Rectal)   Resp 16   Ht 4\' 11"  (1.499 m)   Wt 61.8 kg (136 lb 3.9 oz)   SpO2 100%   BMI 27.52 kg/m   Physical Exam  Constitutional: She is oriented to person, place, and time. She appears well-developed and well-nourished. No distress.  HENT:  Head: Normocephalic and atraumatic.  Dry MM, mild posterior pharyngeal erythema  Eyes: Conjunctivae are normal.  Neck: Neck supple.  Cardiovascular: Normal rate, regular rhythm and normal heart sounds.  Exam reveals no friction rub.   No murmur  heard. Pulmonary/Chest: Effort normal and breath sounds normal. No respiratory distress. She has no wheezes. She has no rales.  Abdominal: Soft. Bowel sounds are normal. She exhibits no distension. There is no tenderness.  Musculoskeletal: She exhibits no edema.  Neurological: She is alert and oriented to person, place, and time. She exhibits normal muscle tone.  Skin: Skin is warm. Capillary refill takes less than 2 seconds.  Sparse petechiae noted at left arm, right lower leg  Psychiatric: She has a normal mood and affect.  Nursing note and vitals reviewed.    ED Treatments / Results  Labs (all labs ordered are listed, but only abnormal results are displayed) Labs Reviewed  COMPREHENSIVE METABOLIC PANEL - Abnormal; Notable for the following:  Result Value   Glucose, Bld 111 (*)    Calcium 8.6 (*)    AST 78 (*)    All other components within normal limits  CBC - Abnormal; Notable for the following:    WBC 3.4 (*)    RBC 5.30 (*)    Hemoglobin 11.1 (*)    HCT 34.1 (*)    MCV 64.3 (*)    MCH 20.9 (*)    Platelets 94 (*)    All other components within normal limits  DIC (DISSEMINATED INTRAVASCULAR COAGULATION) PANEL - Abnormal; Notable for the following:    aPTT 40 (*)    D-Dimer, Quant 1.21 (*)    Platelets 68 (*)    All other components within normal limits  LACTATE DEHYDROGENASE - Abnormal; Notable for the following:    LDH 201 (*)    All other components within normal limits  CSF CELL COUNT WITH DIFFERENTIAL - Abnormal; Notable for the following:    RBC Count, CSF 1,940 (*)    All other components within normal limits  CSF CELL COUNT WITH DIFFERENTIAL - Abnormal; Notable for the following:    RBC Count, CSF 38 (*)    All other components within normal limits  MAGNESIUM - Abnormal; Notable for the following:    Magnesium 1.6 (*)    All other components within normal limits  COMPREHENSIVE METABOLIC PANEL - Abnormal; Notable for the following:    Chloride 115 (*)     Calcium 7.4 (*)    Total Protein 5.5 (*)    Albumin 2.8 (*)    AST 83 (*)    All other components within normal limits  RETICULOCYTES - Abnormal; Notable for the following:    Retic Ct Pct <0.4 (*)    All other components within normal limits  CBC - Abnormal; Notable for the following:    WBC 2.8 (*)    Hemoglobin 9.7 (*)    HCT 30.1 (*)    MCV 64.5 (*)    MCH 20.8 (*)    RDW 15.6 (*)    Platelets 61 (*)    All other components within normal limits  CSF CULTURE  CULTURE, BLOOD (ROUTINE X 2)  CULTURE, BLOOD (ROUTINE X 2)  RESPIRATORY PANEL BY PCR  URINE CULTURE  LIPASE, BLOOD  URINALYSIS, ROUTINE W REFLEX MICROSCOPIC  INFLUENZA PANEL BY PCR (TYPE A & B)  PROTIME-INR  DIFFERENTIAL  PARASITE EXAM SCREEN, BLOOD-W CONF TO [facility] (NOT @ [facility])  CK  GLUCOSE, CSF  PROTEIN, CSF  PHOSPHORUS  TSH  LACTIC ACID, PLASMA  LACTIC ACID, PLASMA  PROCALCITONIN  HAPTOGLOBIN  HIV ANTIBODY (ROUTINE TESTING)  VITAMIN B12  FOLATE  IRON AND TIBC  FERRITIN  PARASITE EXAM, BLOOD  I-STAT CG4 LACTIC ACID, ED  I-STAT CG4 LACTIC ACID, ED    EKG  EKG Interpretation  Date/Time:  Thursday July 15 2017 01:36:32 EDT Ventricular Rate:  63 PR Interval:    QRS Duration: 82 QT Interval:  462 QTC Calculation: 473 R Axis:   75 Text Interpretation:  Sinus rhythm Nonspecific repol abnormality, diffuse leads No significant change was found Confirmed by Paula Libra ([id number]) on 07/15/2017 2:01:55 AM Also confirmed by Paula Libra ([id number]), editor Elita Quick [phone])  on 07/15/2017 8:09:31 AM       Radiology Dg Chest 2 View  Result Date: 07/14/2017 CLINICAL DATA:  Cough and fever EXAM: CHEST  2 VIEW COMPARISON:  03/11/2016, 01/01/2012 FINDINGS: No focal consolidation or effusion. Mild to moderate cardiomegaly. Aortic  atherosclerosis. No pneumothorax. Degenerative changes of the spine. Surgical clips in the upper abdomen. IMPRESSION: Cardiomegaly without edema or focal pulmonary  infiltrate. Electronically Signed   By: Jasmine Pang M.D.   On: 07/14/2017 22:24   Ct Head Wo Contrast  Result Date: 07/14/2017 CLINICAL DATA:  Altered mental status EXAM: CT HEAD WITHOUT CONTRAST TECHNIQUE: Contiguous axial images were obtained from the base of the skull through the vertex without intravenous contrast. COMPARISON:  None. FINDINGS: Brain: No mass lesion, intraparenchymal hemorrhage or extra-axial collection. No evidence of acute cortical infarct. Brain parenchyma and CSF-containing spaces are normal for age. Small calcification at the superior left frontal convexity could be a small meningioma. No abnormality of the underlying brain. Incidentally noted partially empty sella. Vascular: No hyperdense vessel or unexpected calcification. Skull: Normal visualized skull base, calvarium and extracranial soft tissues. Sinuses/Orbits: No sinus fluid levels or advanced mucosal thickening. No mastoid effusion. Normal orbits. IMPRESSION: 1. No acute intracranial abnormality. Normal appearance of the brain for age. 2. Extra-axial, dome-shaped calcification along the left frontal convexity is likely a small meningioma. No abnormality of the underlying brain. Electronically Signed   By: Deatra Robinson M.D.   On: 07/14/2017 22:49   US Abdomen Complete  Result Date: 07/15/2017 CLINICAL DATA:  Thrombocytopenia EXAM: ABDOMEN ULTRASOUND COMPLETE COMPARISON:  05/27/2011 FINDINGS: Gallbladder: Prior cholecystectomy Common bile duct: Diameter: Prominent at 8 mm, compatible with patient's age and post cholecystectomy state. Liver: Increased echotexture compatible with fatty infiltration. No focal abnormality or biliary ductal dilatation. Portal vein is patent on color Doppler imaging with normal direction of blood flow towards the liver. IVC: No abnormality visualized. Pancreas: Visualized portion unremarkable. Spleen: Size and appearance within normal limits. Right Kidney: Length: 10.8 cm. Echogenicity within  normal limits. No mass or hydronephrosis visualized. Left Kidney: Length: 10.9 cm. Echogenicity within normal limits. No mass or hydronephrosis visualized. Abdominal aorta: No aneurysm visualized. Other findings: None. IMPRESSION: Fatty infiltration of the liver. Prior cholecystectomy. No acute findings. Electronically Signed   By: Charlett Nose M.D.   On: 07/15/2017 08:09    Procedures .Lumbar Puncture Date/Time: 07/15/2017 12:45 AM Performed by: Shaune Pollack Authorized by: Shaune Pollack   Consent:    Consent obtained:  Written   Consent given by:  Patient   Risks discussed:  Bleeding, headache, infection, nerve damage, pain and repeat procedure   Alternatives discussed:  Alternative treatment Pre-procedure details:    Procedure purpose:  Diagnostic   Preparation: Patient was prepped and draped in usual sterile fashion   Anesthesia (see MAR for exact dosages):    Anesthesia method:  Local infiltration   Local anesthetic:  Lidocaine 1% w/o epi Procedure details:    Lumbar space:  L4-L5 interspace   Patient position:  L lateral decubitus   Needle gauge:  22   Needle type:  Spinal needle - Quincke tip   Needle length (in):  3.5   Ultrasound guidance: no     Number of attempts:  1   Fluid appearance:  Blood-tinged then clearing   Tubes of fluid:  4   Total volume (ml):  5 Post-procedure:    Puncture site:  Adhesive bandage applied   Patient tolerance of procedure:  Tolerated well, no immediate complications   (including critical care time)  Medications Ordered in ED Medications  lidocaine (XYLOCAINE) 2 % injection (not administered)  vancomycin (VANCOCIN) 500 mg in sodium chloride 0.9 % 100 mL IVPB (500 mg Intravenous New Bag/Given 07/15/17 0944)  atorvastatin (LIPITOR) tablet 40  mg (not administered)  ezetimibe (ZETIA) tablet 10 mg (10 mg Oral Given 07/15/17 0944)  acetaminophen (TYLENOL) tablet 650 mg (not administered)    Or  acetaminophen (TYLENOL) suppository 650 mg  (not administered)  ondansetron (ZOFRAN) tablet 4 mg (not administered)    Or  ondansetron (ZOFRAN) injection 4 mg (not administered)  0.9 %  sodium chloride infusion ( Intravenous New Bag/Given 07/15/17 0442)  magnesium sulfate IVPB 2 g 50 mL (not administered)  ceFEPIme (MAXIPIME) 1 g in dextrose 5 % 50 mL IVPB (not administered)  sodium chloride 0.9 % bolus 1,000 mL (0 mLs Intravenous Stopped 07/15/17 0044)  acetaminophen (TYLENOL) tablet 650 mg (650 mg Oral Given 07/14/17 2308)  metoCLOPramide (REGLAN) injection 5 mg (5 mg Intravenous Given 07/14/17 2330)  diphenhydrAMINE (BENADRYL) injection 12.5 mg (12.5 mg Intravenous Given 07/14/17 2332)  sodium chloride 0.9 % bolus 1,000 mL (0 mLs Intravenous Stopped 07/15/17 0145)  lidocaine (XYLOCAINE) 2 % injection 10 mL (10 mLs Intradermal Given by Other 07/15/17 0044)     Initial Impression / Assessment and Plan / ED Course  I have reviewed the triage vital signs and the nursing notes.  Pertinent labs & imaging results that were available during my care of the patient were reviewed by me and considered in my medical decision making (see chart for details).     71 yo F with PMHx CAD here with fever, headache, nausea/vomiting, inability to tolerate PO after travelling to [place]. Labs show mild AST elevation, acute leukopenia, acute thrombocytopenia, and chronic anemia. Concern for possible mosquito-borne illness (Dengue, malaria), other viral illness. LP performed as well for eval of meningitis, though less likely.  LP performed, tolerated well. No bleeding. Fluid appeared clear, and VS remain stable. Admit to hospitalist.  Final Clinical Impressions(s) / ED Diagnoses   Final diagnoses:  Thrombocytopenia (HCC)  SIRS (systemic inflammatory response syndrome) (HCC)  Bad headache    New Prescriptions Current Discharge Medication List       Shaune PollackIsaacs, Daiton Cowles, MD 07/15/17 1031

## 2017-07-14 NOTE — ED Triage Notes (Signed)
Patient returned from [place] 6 days ago. Last night patient c/o headache, nausea, and diarrhea x 2.

## 2017-07-14 NOTE — ED Notes (Signed)
In ct  

## 2017-07-15 ENCOUNTER — Inpatient Hospital Stay (HOSPITAL_COMMUNITY): Payer: Medicare Other

## 2017-07-15 DIAGNOSIS — R6510 Systemic inflammatory response syndrome (SIRS) of non-infectious origin without acute organ dysfunction: Secondary | ICD-10-CM

## 2017-07-15 DIAGNOSIS — I252 Old myocardial infarction: Secondary | ICD-10-CM | POA: Diagnosis not present

## 2017-07-15 DIAGNOSIS — D509 Iron deficiency anemia, unspecified: Secondary | ICD-10-CM | POA: Diagnosis not present

## 2017-07-15 DIAGNOSIS — B349 Viral infection, unspecified: Secondary | ICD-10-CM | POA: Diagnosis present

## 2017-07-15 DIAGNOSIS — R51 Headache: Secondary | ICD-10-CM | POA: Diagnosis not present

## 2017-07-15 DIAGNOSIS — D72818 Other decreased white blood cell count: Secondary | ICD-10-CM | POA: Diagnosis not present

## 2017-07-15 DIAGNOSIS — R42 Dizziness and giddiness: Secondary | ICD-10-CM

## 2017-07-15 DIAGNOSIS — D696 Thrombocytopenia, unspecified: Secondary | ICD-10-CM | POA: Diagnosis present

## 2017-07-15 DIAGNOSIS — I517 Cardiomegaly: Secondary | ICD-10-CM | POA: Diagnosis present

## 2017-07-15 DIAGNOSIS — R112 Nausea with vomiting, unspecified: Secondary | ICD-10-CM | POA: Diagnosis not present

## 2017-07-15 DIAGNOSIS — D649 Anemia, unspecified: Secondary | ICD-10-CM

## 2017-07-15 DIAGNOSIS — I110 Hypertensive heart disease with heart failure: Secondary | ICD-10-CM | POA: Diagnosis present

## 2017-07-15 DIAGNOSIS — D61818 Other pancytopenia: Secondary | ICD-10-CM | POA: Diagnosis present

## 2017-07-15 DIAGNOSIS — Z9049 Acquired absence of other specified parts of digestive tract: Secondary | ICD-10-CM | POA: Diagnosis not present

## 2017-07-15 DIAGNOSIS — Z955 Presence of coronary angioplasty implant and graft: Secondary | ICD-10-CM | POA: Diagnosis not present

## 2017-07-15 DIAGNOSIS — R509 Fever, unspecified: Secondary | ICD-10-CM | POA: Diagnosis not present

## 2017-07-15 DIAGNOSIS — I2510 Atherosclerotic heart disease of native coronary artery without angina pectoris: Secondary | ICD-10-CM | POA: Diagnosis not present

## 2017-07-15 DIAGNOSIS — E785 Hyperlipidemia, unspecified: Secondary | ICD-10-CM | POA: Diagnosis not present

## 2017-07-15 DIAGNOSIS — R918 Other nonspecific abnormal finding of lung field: Secondary | ICD-10-CM | POA: Diagnosis not present

## 2017-07-15 DIAGNOSIS — I5032 Chronic diastolic (congestive) heart failure: Secondary | ICD-10-CM | POA: Diagnosis not present

## 2017-07-15 DIAGNOSIS — D72819 Decreased white blood cell count, unspecified: Secondary | ICD-10-CM | POA: Diagnosis present

## 2017-07-15 DIAGNOSIS — K219 Gastro-esophageal reflux disease without esophagitis: Secondary | ICD-10-CM | POA: Diagnosis not present

## 2017-07-15 DIAGNOSIS — M1990 Unspecified osteoarthritis, unspecified site: Secondary | ICD-10-CM | POA: Diagnosis present

## 2017-07-15 DIAGNOSIS — Z9861 Coronary angioplasty status: Secondary | ICD-10-CM | POA: Diagnosis not present

## 2017-07-15 DIAGNOSIS — R042 Hemoptysis: Secondary | ICD-10-CM | POA: Diagnosis present

## 2017-07-15 DIAGNOSIS — Z8249 Family history of ischemic heart disease and other diseases of the circulatory system: Secondary | ICD-10-CM | POA: Diagnosis not present

## 2017-07-15 DIAGNOSIS — Z85038 Personal history of other malignant neoplasm of large intestine: Secondary | ICD-10-CM | POA: Diagnosis not present

## 2017-07-15 DIAGNOSIS — K760 Fatty (change of) liver, not elsewhere classified: Secondary | ICD-10-CM | POA: Diagnosis not present

## 2017-07-15 LAB — URINALYSIS, ROUTINE W REFLEX MICROSCOPIC
BILIRUBIN URINE: NEGATIVE
Glucose, UA: NEGATIVE mg/dL
HGB URINE DIPSTICK: NEGATIVE
KETONES UR: NEGATIVE mg/dL
Leukocytes, UA: NEGATIVE
NITRITE: NEGATIVE
Protein, ur: NEGATIVE mg/dL
SPECIFIC GRAVITY, URINE: 1.01 (ref 1.005–1.030)
pH: 5 (ref 5.0–8.0)

## 2017-07-15 LAB — DIFFERENTIAL
BASOS PCT: 2 %
Basophils Absolute: 0.1 10*3/uL (ref 0.0–0.1)
EOS PCT: 1 %
Eosinophils Absolute: 0 10*3/uL (ref 0.0–0.7)
Lymphocytes Relative: 28 %
Lymphs Abs: 1 10*3/uL (ref 0.7–4.0)
MONO ABS: 0.3 10*3/uL (ref 0.1–1.0)
Monocytes Relative: 9 %
NEUTROS ABS: 2 10*3/uL (ref 1.7–7.7)
Neutrophils Relative %: 60 %

## 2017-07-15 LAB — CSF CELL COUNT WITH DIFFERENTIAL
RBC COUNT CSF: 1940 /mm3 — AB
RBC Count, CSF: 38 /mm3 — ABNORMAL HIGH
TUBE #: 1
TUBE #: 4
WBC CSF: 2 /mm3 (ref 0–5)
WBC, CSF: 3 /mm3 (ref 0–5)

## 2017-07-15 LAB — DIC (DISSEMINATED INTRAVASCULAR COAGULATION)PANEL
Fibrinogen: 324 mg/dL (ref 210–475)
Smear Review: NONE SEEN
aPTT: 40 seconds — ABNORMAL HIGH (ref 24–36)

## 2017-07-15 LAB — PROCALCITONIN: Procalcitonin: 0.17 ng/mL

## 2017-07-15 LAB — IRON AND TIBC
Iron: 24 ug/dL — ABNORMAL LOW (ref 28–170)
Saturation Ratios: 12 % (ref 10.4–31.8)
TIBC: 196 ug/dL — ABNORMAL LOW (ref 250–450)
UIBC: 172 ug/dL

## 2017-07-15 LAB — I-STAT CG4 LACTIC ACID, ED: Lactic Acid, Venous: 0.61 mmol/L (ref 0.5–1.9)

## 2017-07-15 LAB — COMPREHENSIVE METABOLIC PANEL
ALBUMIN: 2.8 g/dL — AB (ref 3.5–5.0)
ALT: 51 U/L (ref 14–54)
ANION GAP: 7 (ref 5–15)
AST: 83 U/L — AB (ref 15–41)
Alkaline Phosphatase: 48 U/L (ref 38–126)
BUN: 15 mg/dL (ref 6–20)
CO2: 22 mmol/L (ref 22–32)
Calcium: 7.4 mg/dL — ABNORMAL LOW (ref 8.9–10.3)
Chloride: 115 mmol/L — ABNORMAL HIGH (ref 101–111)
Creatinine, Ser: 0.56 mg/dL (ref 0.44–1.00)
GFR calc Af Amer: >60 mL/min (ref >60)
GFR calc non Af Amer: >60 mL/min (ref >60)
GLUCOSE: 94 mg/dL (ref 65–99)
POTASSIUM: 3.5 mmol/L (ref 3.5–5.1)
Sodium: 144 mmol/L (ref 135–145)
Total Bilirubin: 0.5 mg/dL (ref 0.3–1.2)
Total Protein: 5.5 g/dL — ABNORMAL LOW (ref 6.5–8.1)

## 2017-07-15 LAB — ECHOCARDIOGRAM COMPLETE
Height: 59 in
Weight: 2179.91 oz

## 2017-07-15 LAB — RESPIRATORY PANEL BY PCR
Adenovirus: NOT DETECTED
BORDETELLA PERTUSSIS-RVPCR: NOT DETECTED
CHLAMYDOPHILA PNEUMONIAE-RVPPCR: NOT DETECTED
CORONAVIRUS 229E-RVPPCR: NOT DETECTED
Coronavirus HKU1: NOT DETECTED
Coronavirus NL63: NOT DETECTED
Coronavirus OC43: NOT DETECTED
INFLUENZA B-RVPPCR: NOT DETECTED
Influenza A: NOT DETECTED
METAPNEUMOVIRUS-RVPPCR: NOT DETECTED
Mycoplasma pneumoniae: NOT DETECTED
PARAINFLUENZA VIRUS 2-RVPPCR: NOT DETECTED
PARAINFLUENZA VIRUS 3-RVPPCR: NOT DETECTED
Parainfluenza Virus 1: NOT DETECTED
Parainfluenza Virus 4: NOT DETECTED
RESPIRATORY SYNCYTIAL VIRUS-RVPPCR: NOT DETECTED
RHINOVIRUS / ENTEROVIRUS - RVPPCR: NOT DETECTED

## 2017-07-15 LAB — CBC
HEMATOCRIT: 30.1 % — AB (ref 36.0–46.0)
Hemoglobin: 9.7 g/dL — ABNORMAL LOW (ref 12.0–15.0)
MCH: 20.8 pg — AB (ref 26.0–34.0)
MCHC: 32.2 g/dL (ref 30.0–36.0)
MCV: 64.5 fL — ABNORMAL LOW (ref 78.0–100.0)
Platelets: 61 10*3/uL — ABNORMAL LOW (ref 150–400)
RBC: 4.67 MIL/uL (ref 3.87–5.11)
RDW: 15.6 % — AB (ref 11.5–15.5)
WBC: 2.8 10*3/uL — ABNORMAL LOW (ref 4.0–10.5)

## 2017-07-15 LAB — VITAMIN B12: VITAMIN B 12: 447 pg/mL (ref 180–914)

## 2017-07-15 LAB — LACTATE DEHYDROGENASE: LDH: 201 U/L — AB (ref 98–192)

## 2017-07-15 LAB — CK: CK TOTAL: 57 U/L (ref 38–234)

## 2017-07-15 LAB — RETICULOCYTES: RBC.: 4.66 MIL/uL (ref 3.87–5.11)

## 2017-07-15 LAB — INFLUENZA PANEL BY PCR (TYPE A & B)
INFLAPCR: NEGATIVE
Influenza B By PCR: NEGATIVE

## 2017-07-15 LAB — DIC (DISSEMINATED INTRAVASCULAR COAGULATION) PANEL
D DIMER QUANT: 1.21 ug{FEU}/mL — AB (ref 0.00–0.50)
INR: 0.99
PLATELETS: 68 10*3/uL — AB (ref 150–400)
PROTHROMBIN TIME: 13 s (ref 11.4–15.2)

## 2017-07-15 LAB — PHOSPHORUS: PHOSPHORUS: 3.1 mg/dL (ref 2.5–4.6)

## 2017-07-15 LAB — GLUCOSE, CSF: GLUCOSE CSF: 56 mg/dL (ref 40–70)

## 2017-07-15 LAB — PARASITE EXAM SCREEN, BLOOD-W CONF TO LABCORP (NOT @ ARMC)

## 2017-07-15 LAB — HIV ANTIBODY (ROUTINE TESTING W REFLEX): HIV SCREEN 4TH GENERATION: NONREACTIVE

## 2017-07-15 LAB — LACTIC ACID, PLASMA
LACTIC ACID, VENOUS: 0.8 mmol/L (ref 0.5–1.9)
LACTIC ACID, VENOUS: 1.1 mmol/L (ref 0.5–1.9)

## 2017-07-15 LAB — MAGNESIUM: MAGNESIUM: 1.6 mg/dL — AB (ref 1.7–2.4)

## 2017-07-15 LAB — PROTEIN, CSF: TOTAL PROTEIN, CSF: 31 mg/dL (ref 15–45)

## 2017-07-15 LAB — TSH: TSH: 2.525 u[IU]/mL (ref 0.350–4.500)

## 2017-07-15 LAB — FERRITIN: Ferritin: 1379 ng/mL — ABNORMAL HIGH (ref 11–307)

## 2017-07-15 LAB — FOLATE: FOLATE: 27 ng/mL (ref >5.9)

## 2017-07-15 MED ORDER — PIPERACILLIN-TAZOBACTAM 3.375 G IVPB 30 MIN: 3.3750 g | Freq: Once | INTRAVENOUS | Status: DC

## 2017-07-15 MED ORDER — SODIUM CHLORIDE 0.9 % IV SOLN
INTRAVENOUS | Status: AC
  Administered 2017-07-15: 05:00:00 via INTRAVENOUS

## 2017-07-15 MED ORDER — ATORVASTATIN CALCIUM 40 MG PO TABS
40.0000 mg | ORAL_TABLET | Freq: Every day | ORAL | Status: DC
  Administered 2017-07-15 – 2017-07-16 (×2): 40 mg via ORAL
  Filled 2017-07-15 (×2): qty 1

## 2017-07-15 MED ORDER — DEXTROSE 5 % IV SOLN: 1.0000 g | INTRAVENOUS | Status: DC

## 2017-07-15 MED ORDER — SODIUM CHLORIDE 0.9 % IV SOLN
2.0000 g | Freq: Four times a day (QID) | INTRAVENOUS | Status: DC
  Filled 2017-07-15 (×2): qty 2000

## 2017-07-15 MED ORDER — ONDANSETRON HCL 4 MG/2ML IJ SOLN
4.0000 mg | Freq: Four times a day (QID) | INTRAMUSCULAR | Status: DC | PRN
  Administered 2017-07-16: 4 mg via INTRAVENOUS
  Filled 2017-07-15: qty 2

## 2017-07-15 MED ORDER — DEXTROSE 5 % IV SOLN
2.0000 g | Freq: Two times a day (BID) | INTRAVENOUS | Status: DC
  Administered 2017-07-15: 2 g via INTRAVENOUS
  Filled 2017-07-15: qty 2

## 2017-07-15 MED ORDER — ACETAMINOPHEN 325 MG PO TABS
650.0000 mg | ORAL_TABLET | Freq: Four times a day (QID) | ORAL | Status: DC | PRN
  Administered 2017-07-17: 650 mg via ORAL
  Filled 2017-07-15 (×2): qty 2

## 2017-07-15 MED ORDER — EZETIMIBE 10 MG PO TABS
10.0000 mg | ORAL_TABLET | Freq: Every day | ORAL | Status: DC
  Administered 2017-07-15 – 2017-07-16 (×2): 10 mg via ORAL
  Filled 2017-07-15 (×2): qty 1

## 2017-07-15 MED ORDER — LIDOCAINE HCL (PF) 2 % IJ SOLN
INTRAMUSCULAR | Status: AC
  Filled 2017-07-15: qty 10

## 2017-07-15 MED ORDER — SODIUM CHLORIDE 0.9 % IV SOLN
2.0000 g | INTRAVENOUS | Status: DC
  Administered 2017-07-15 (×2): 2 g via INTRAVENOUS
  Filled 2017-07-15 (×3): qty 2000

## 2017-07-15 MED ORDER — LIDOCAINE HCL (PF) 2 % IJ SOLN
10.0000 mL | Freq: Once | INTRAMUSCULAR | Status: AC
  Administered 2017-07-15: 10 mL via INTRADERMAL

## 2017-07-15 MED ORDER — MAGNESIUM SULFATE 2 GM/50ML IV SOLN
2.0000 g | Freq: Once | INTRAVENOUS | Status: AC
  Administered 2017-07-15: 2 g via INTRAVENOUS
  Filled 2017-07-15: qty 50

## 2017-07-15 MED ORDER — ONDANSETRON HCL 4 MG PO TABS: 4.0000 mg | ORAL_TABLET | Freq: Four times a day (QID) | ORAL | Status: DC | PRN

## 2017-07-15 MED ORDER — VANCOMYCIN HCL 500 MG IV SOLR
500.0000 mg | Freq: Two times a day (BID) | INTRAVENOUS | Status: DC
  Administered 2017-07-15 – 2017-07-16 (×5): 500 mg via INTRAVENOUS
  Filled 2017-07-15 (×6): qty 500

## 2017-07-15 MED ORDER — DEXTROSE 5 % IV SOLN
1.0000 g | Freq: Three times a day (TID) | INTRAVENOUS | Status: DC
  Administered 2017-07-15 – 2017-07-16 (×3): 1 g via INTRAVENOUS
  Filled 2017-07-15 (×4): qty 1

## 2017-07-15 MED ORDER — ACETAMINOPHEN 650 MG RE SUPP: 650.0000 mg | Freq: Four times a day (QID) | RECTAL | Status: DC | PRN

## 2017-07-15 NOTE — H&P (Signed)
Tammy Murillo ZOX:[id number] DOB: 24-Jul-1946 DOA: 07/14/2017     PCP: Fleet Contras, MD   [facility]  Dr. Herbie Baltimore Patient coming from:  home Lives  With family    Chief Complaint: nausea/vomiting fatigue   HPI: Tammy Murillo is a 71 y.o. female with medical history significant of CAD, non-ST elevation MI Titus post DES and thousand 13, HTN, HLD     Presented with 6 days ago patient return from  [place] she stayed there for 3 weeks no one had similar complaints there. Since the morning after the trip she's been having intermittent headaches for some nausea and vomiting fevers chills generalized fatigue and body aches denies photophobia phonophobia. Does have some neck soreness associated cough but nonproductive she is unable to tolerate any by mouth's. Denies any sick contacts at home denies any tick bites. Reports 20 years ago she had similar illness when she first came back from [place] She was tested extensively but does not remember what was the diagnosis.  Reports night sweats for the past 1 week lost 1 lb. NO melena no blood in stool Never been told  she is anemic but Hg at baseline in 10-11 range.  No hx of similar illness in the family.    Regarding pertinent Chronic problems:history of coronary artery disease followed by cardiology Last echogram was in 2012 showed evidence of diastolic dysfunction Patient have had persistent anemia with hemoglobin 6 in 10-11 range with evidence of low MCV IN ER:  Temp (24hrs), Avg:99.3 F (37.4 C), Min:98.9 F (37.2 C), Max:99.6 F (37.6 C)      on arrival  ED Triage Vitals  Enc Vitals Group     BP 07/14/17 1718 (!) 129/59     Pulse Rate 07/14/17 1718 73     Resp 07/14/17 1718 18     Temp 07/14/17 1718 98.9 F (37.2 C)     Temp Source 07/14/17 1718 Oral     SpO2 07/14/17 1718 95 %     Weight 07/14/17 1722 128 lb (58.1 kg)     Height --      Head Circumference --      Peak Flow --      Pain Score 07/14/17  1721 6     Pain Loc --      Pain Edu? --      Excl. in GC? --     Latest 99.6 98% BP 106/58  Following Medications were ordered in ER: Medications  sodium chloride 0.9 % bolus 1,000 mL (1,000 mLs Intravenous New Bag/Given 07/14/17 2314)  sodium chloride 0.9 % bolus 1,000 mL (not administered)  acetaminophen (TYLENOL) tablet 650 mg (650 mg Oral Given 07/14/17 2308)  metoCLOPramide (REGLAN) injection 5 mg (5 mg Intravenous Given 07/14/17 2330)  diphenhydrAMINE (BENADRYL) injection 12.5 mg (12.5 mg Intravenous Given 07/14/17 2332)      Hospitalist was called for admission for sirs and pancytopenia  Review of Systems:    Pertinent positives include: Fevers, chills, fatigue, Headaches, joint pain  rash  Constitutional:  No weight loss, night sweats,  weight loss  HEENT:  No  Difficulty swallowing,Tooth/dental problems,Sore throat,  No sneezing, itching, ear ache, nasal congestion, post nasal drip,  Cardio-vascular:  No chest pain, Orthopnea, PND, anasarca, dizziness, palpitations.no Bilateral lower extremity swelling  GI:  No heartburn, indigestion, abdominal pain, nausea, vomiting, diarrhea, change in bowel habits, loss of appetite, melena, blood in stool, hematemesis Resp:  no shortness of breath at rest. No  dyspnea on exertion, No excess mucus, no productive cough, No non-productive cough, No coughing up of blood.No change in color of mucus.No wheezing. Skin:  noor lesions. No jaundice GU:  no dysuria, change in color of urine, no urgency or frequency. No straining to urinate.  No flank pain.  Musculoskeletal:  No or no joint swelling. No decreased range of motion. No back pain.  Psych:  No change in mood or affect. No depression or anxiety. No memory loss.  Neuro: no localizing neurological complaints, no tingling, no weakness, no double vision, no gait abnormality, no slurred speech, no confusion  As per HPI otherwise 10 point review of systems negative.   Past Medical  History: Past Medical History:  Diagnosis Date  . Anxiety   . Borderline hypertension   . CAD S/P percutaneous coronary angioplasty 12/31/2011   3 sequential LAD lesions treated with a single Promus Element DES; - Myoview 02/2016: No infarct or ischemia. EF 60-65%.  . Costochondritis   . Dyslipidemia, goal LDL below 70   . H/O echocardiogram April 2014   Normal EF: 55-60%, no regional WMA, grade 1 diastolic dysfunction. Mild MR  . History of colon cancer  2010  . History of: Non-ST elevation MI (NSTEMI) 12/31/2011   Severe LAD lesions: Sequential 90%, 70% and 60%; no other significant stenoses. She had had a normal Myoview in 2012.  . Osteoarthritis   . Presence of drug coated stent in LAD coronary artery 01/01/2012   Promus Element DES 2.75 mm x38 mm postdilated to 3.0 mm   Past Surgical History:  Procedure Laterality Date  . CHOLECYSTECTOMY  2004  . CORONARY ANGIOPLASTY WITH STENT PLACEMENT  01/01/2012   Promus Element DES 2.75 MM by 30 MM, postdilated to 3.0 MM  . LEFT HEART CATHETERIZATION WITH CORONARY ANGIOGRAM N/A 01/01/2012   Procedure: LEFT HEART CATHETERIZATION WITH CORONARY ANGIOGRAM;  Surgeon: Lennette Biharihomas A Kelly, MD;  [facility];  Service: Cardiovascular;  Laterality: N/A;  . NM MYOVIEW LTD  September 2014   No ischemia or infarction. EF 70%  . NM MYOVIEW LTD  02/2016    LOW RISK study. No ischemia or infarction. EF 60-65%.  Marland Kitchen. PERCUTANEOUS CORONARY STENT INTERVENTION (PCI-S)  01/01/2012   Procedure: PERCUTANEOUS CORONARY STENT INTERVENTION (PCI-S);  Surgeon: Lennette Biharihomas A Kelly, MD;  [facility];  Service: Cardiovascular;;  . TRANSTHORACIC ECHOCARDIOGRAM  April 2014   EF 55%. Grade 1 diastolic dysfunction. Mild MR     Social History:  Ambulatory   independently     reports that she has never smoked. She has never used smokeless tobacco. She reports that she does not drink alcohol or use drugs.  Allergies:  No Known Allergies     Family History:   Family  History  Problem Relation Age of Onset  . Coronary artery disease Mother     Medications: Prior to Admission medications   Medication Sig Start Date End Date Taking? Authorizing Provider  acetaminophen (TYLENOL) 325 MG tablet Take 650 mg by mouth as needed for pain.    [provider]  aspirin EC 81 MG tablet Take 1 tablet (81 mg total) by mouth daily. 11/06/16   Marykay LexHarding, David W, MD  atorvastatin (LIPITOR) 40 MG tablet TAKE 1 TABLET BY MOUTH EVERY DAY AT 6 PM 02/02/17   Marykay LexHarding, David W, MD  esomeprazole (NEXIUM) 40 MG capsule TAKE ONE CAPSULE BY MOUTH EVERY DAY BEFORE BREAKFAST 06/10/17   Marykay LexHarding, David W, MD  isosorbide mononitrate (IMDUR)  60 MG 24 hr tablet Take 1 tablet (60 mg total) by mouth daily. 10/17/15   Marykay Lex, MD  metoprolol tartrate (LOPRESSOR) 25 MG tablet TAKE 1 TABLET BY MOUTH TWICE A DAY 03/15/17   Marykay Lex, MD  nitroGLYCERIN (NITROSTAT) 0.4 MG SL tablet TAKE 1 TABLET UNDER THE TONGUE, EVERY 5 MINUETS FOR 3 DOSES, AS NEEDED FOR CHEST PAIN AS DIRECTED 03/13/16   Leone Brand, NP  tiZANidine (ZANAFLEX) 4 MG tablet Take 4 mg by mouth at bedtime as needed. 01/08/15   [provider]  VOLTAREN 1 % GEL Apply 1 application topically 4 (four) times daily as needed. 12/26/14   [provider]  ZETIA 10 MG tablet TAKE 1 TABLET BY MOUTH EVERY DAY 02/18/16   Marykay Lex, MD    Physical Exam: Patient Vitals for the past 24 hrs:  BP Temp Temp src Pulse Resp SpO2 Weight  07/14/17 2320 - 99.6 F (37.6 C) Rectal - - - -  07/14/17 2144 (!) 106/58 99.5 F (37.5 C) Oral 68 19 98 % -  07/14/17 1722 - - - - - - 58.1 kg (128 lb)  07/14/17 1718 (!) 129/59 98.9 F (37.2 C) Oral 73 18 95 % -    1. General:  in No Acute distress  well -appearing 2. Psychological: Alert and   Oriented 3. Head/ENT:   Dry Mucous Membranes                          Head Non traumatic, neck supple                           Poor Dentition 4. SKIN:   decreased Skin  turgor,  Skin clean Dry and intact occasional widespread petechia unclear clinical significance    5. Heart: Regular rate and rhythm no  Murmur, no Rub or gallop 6. Lungs:  no wheezes or crackles   7. Abdomen: Soft,   non-tender, Non distended  obese   8. Lower extremities: no clubbing, cyanosis, or edema 9. Neurologically Grossly intact, moving all 4 extremities equally   10. MSK: Normal range of motion   body mass index is 25 kg/m.  Labs on Admission:   Labs on Admission: I have personally reviewed following labs and imaging studies  CBC:  Recent Labs Lab 07/14/17 1739  WBC 3.4*  NEUTROABS 2.0  HGB 11.1*  HCT 34.1*  MCV 64.3*  PLT 94*   Basic Metabolic Panel:  Recent Labs Lab 07/14/17 1739  NA 141  K 4.0  CL 109  CO2 22  GLUCOSE 111*  BUN 20  CREATININE 0.56  CALCIUM 8.6*   GFR: CrCl cannot be calculated (Unknown ideal weight.). Liver Function Tests:  Recent Labs Lab 07/14/17 1739  AST 78*  ALT 51  ALKPHOS 63  BILITOT 0.5  PROT 7.0  ALBUMIN 3.7    Recent Labs Lab 07/14/17 1739  LIPASE 43   No results for input(s): AMMONIA in the last 168 hours. Coagulation Profile:  Recent Labs Lab 07/14/17 2236  INR 0.85   Cardiac Enzymes: No results for input(s): CKTOTAL, CKMB, CKMBINDEX, TROPONINI in the last 168 hours. BNP (last 3 results) No results for input(s): PROBNP in the last 8760 hours. HbA1C: No results for input(s): HGBA1C in the last 72 hours. CBG: No results for input(s): GLUCAP in the last 168 hours. Lipid Profile: No results for input(s): CHOL, HDL,  LDLCALC, TRIG, CHOLHDL, LDLDIRECT in the last 72 hours. Thyroid Function Tests: No results for input(s): TSH, T4TOTAL, FREET4, T3FREE, THYROIDAB in the last 72 hours. Anemia Panel: No results for input(s): VITAMINB12, FOLATE, FERRITIN, TIBC, IRON, RETICCTPCT in the last 72 hours. Urine analysis: No results found for: COLORURINE, APPEARANCEUR, LABSPEC, PHURINE, GLUCOSEU, HGBUR,  BILIRUBINUR, KETONESUR, PROTEINUR, UROBILINOGEN, NITRITE, LEUKOCYTESUR Sepsis Labs: @LABRCNTIP (procalcitonin:4,lacticidven:4) )No results found for this or any previous visit (from the past 240 hour(s)).    UA  ordered  No results found for: HGBA1C  CrCl cannot be calculated (Unknown ideal weight.).  BNP (last 3 results) No results for input(s): PROBNP in the last 8760 hours.   ECG REPORT Ordered  Filed Weights   07/14/17 1722  Weight: 58.1 kg (128 lb)     Cultures: No results found for: SDES, SPECREQUEST, CULT, REPTSTATUS   Radiological Exams on Admission: Dg Chest 2 View  Result Date: 07/14/2017 CLINICAL DATA:  Cough and fever EXAM: CHEST  2 VIEW COMPARISON:  03/11/2016, 01/01/2012 FINDINGS: No focal consolidation or effusion. Mild to moderate cardiomegaly. Aortic atherosclerosis. No pneumothorax. Degenerative changes of the spine. Surgical clips in the upper abdomen. IMPRESSION: Cardiomegaly without edema or focal pulmonary infiltrate. Electronically Signed   By: Jasmine Pang M.D.   On: 07/14/2017 22:24   Ct Head Wo Contrast  Result Date: 07/14/2017 CLINICAL DATA:  Altered mental status EXAM: CT HEAD WITHOUT CONTRAST TECHNIQUE: Contiguous axial images were obtained from the base of the skull through the vertex without intravenous contrast. COMPARISON:  None. FINDINGS: Brain: No mass lesion, intraparenchymal hemorrhage or extra-axial collection. No evidence of acute cortical infarct. Brain parenchyma and CSF-containing spaces are normal for age. Small calcification at the superior left frontal convexity could be a small meningioma. No abnormality of the underlying brain. Incidentally noted partially empty sella. Vascular: No hyperdense vessel or unexpected calcification. Skull: Normal visualized skull base, calvarium and extracranial soft tissues. Sinuses/Orbits: No sinus fluid levels or advanced mucosal thickening. No mastoid effusion. Normal orbits. IMPRESSION: 1. No acute  intracranial abnormality. Normal appearance of the brain for age. 2. Extra-axial, dome-shaped calcification along the left frontal convexity is likely a small meningioma. No abnormality of the underlying brain. Electronically Signed   By: Deatra Robinson M.D.   On: 07/14/2017 22:49    Chart has been reviewed    Assessment/Plan  71 y.o. female with medical history significant of CAD, non-ST elevation MI Titus post DES and thousand 13, HTN, HLD history of colon cancer   Admitted for sirs and pancytopenia  Present on Admission: Sirs - obtain blood cultures urine urine culture, viral panel, evaluate blood for parasitic infections check for HIV, given headache LP was performed. ER M.D. Sample appeared clear will await results of CSF. Given prolonged illness with minimal neck pain or stiffness bacterial meningitis unlikely for now cover broadly vancomycin and Rocephin, ampiciln  until results available.  . Thrombocytopenia (HCC) - obtain Haptoglobin, LDH, suspect viral etiology . Leucopenia - -no cell abnormalities noted on Differential reassuring . Pancytopenia (HCC) - at this point wide differential.infectious process such as viral illness would be most likely. Other possibilities medication induced versus bone marrow failure. We'll continue to follow extensive workup ordered for infectious etiologies. May benefit from ID consult in a.m. If persistent would benefit from hematology oncology evaluation for bone marrow evaluation . Cardiomegaly order echogram evaluate for pericardial effusion . Anemia - of note patient has history of long-standing anemia with low MCV she denies any history of blood loss.  We'll obtain anemia panel and Hemoccult stool this could be potentially unrelated to current illness . Chronic diastolic CHF (congestive heart failure) (HCC)currently appears to be slightly on the dry side will gently rehydrate . Dyslipidemia, goal LDL below 70-continue Zetia  Other plan as per  orders.  DVT prophylaxis:  SCD   Code Status:  FULL CODE ort care as per patient   Family Communication:   Family   at  Bedside  plan of care was discussed with  Daughter,   Disposition Plan:      To home once workup is complete and patient is stable                             Consults called: none    Admission status:    obs   Level of care    tele           I have spent a total of  56 min on this admission    Mazell Aylesworth 07/15/2017, 1:57 AM    [facility]  Pager [phone]   after 2 AM please page floor coverage PA If 7AM-7PM, please contact the day team taking care of the patient  [email or web address]  Password [email or web address]

## 2017-07-15 NOTE — Progress Notes (Signed)
Pharmacy Antibiotic Note  Tammy Murillo is a 71 y.o. female with HA, nausea and diarrhea admitted on 07/14/2017 with sepsis.  Pharmacy has been consulted for vancomycin, ampicillin and rocephin for r/o meningitis.   Plan: Rocephin 2 Gm IV q12h Ampicillin 2 Gm IV q4h Vancomycin 500 mg IV q12h VT=15-20 mg/L Daily Scr F/u cultures/levels  Weight: 128 lb (58.1 kg)  Temp (24hrs), Avg:99.3 F (37.4 C), Min:98.9 F (37.2 C), Max:99.6 F (37.6 C)   Recent Labs Lab 07/14/17 1739 07/14/17 2330  WBC 3.4*  --   CREATININE 0.56  --   LATICACIDVEN  --  1.11    CrCl cannot be calculated (Unknown ideal weight.).    No Known Allergies  Antimicrobials this admission: 10/18 rocephin >>  10/18 ampicillin >> 10/18 vancomycin >>   Dose adjustments this admission:   Microbiology results:  BCx:   UCx:    Sputum:    MRSA PCR:   Thank you for allowing pharmacy to be a part of this patient's care.  Lorenza EvangelistGreen, Jamye Balicki R 07/15/2017 1:18 AM

## 2017-07-15 NOTE — Progress Notes (Signed)
  Echocardiogram 2D Echocardiogram has been performed.  Nolon RodBrown, Tony 07/15/2017, 10:46 AM

## 2017-07-15 NOTE — Progress Notes (Signed)
PROGRESS NOTE                                                                                                                                                                                                             Patient Demographics:    Tammy Murillo, is a 71 y.o. female, DOB - October 23, 1945, ZOX:[id number]  Admit date - 07/14/2017   Admitting Physician Therisa DoyneAnastassia Doutova, MD  Outpatient Primary MD for the patient is Fleet ContrasAvbuere, Edwin, MD  LOS - 0   Chief Complaint  Patient presents with  . Nausea  . Headache  . Diarrhea       Brief Narrative   As is a no charge note for patient admitted earlier today by Dr Adela Glimpseoutova   Subjective:    Tammy Murillo today had a significantly subsided, reports occasional cough, no chest pain, no shortness of breath, no neck pain, no photophobia.  Assessment  & Plan :    Active Problems:   Dyslipidemia, goal LDL below 70   CAD S/P percutaneous coronary angioplasty - DES to LAD April 2013   Thrombocytopenia (HCC)   Leucopenia   Pancytopenia (HCC)   Cardiomegaly   Anemia   Chronic diastolic CHF (congestive heart failure) (HCC)   SIRS (systemic inflammatory response syndrome) (HCC)   pancytopenia - appears to be new onset, so far no clear etiology, most likely due to infectious process, most likely a virus, follow on blood cultures. - Otology consulted for further input  Chronic diastolic CHF - Patient's to be euvolemic, continue to monitor,follow on 2-D echo  Dyslipidemia - continue with Zetia  Headache - reports significantly subsided, LP negative for infectious process, so I have stopped ampicillin and Rocephin.  SIRS - So far no evidence of infectious process, will continue empirically today with vancomycin and cefepime, workup remains negative will DC antibiotics in a.m.  Hemoptysis - Patient had one episode of blood-tinged sputum, given her recent travel to [place], I have obtained a VQ scan which is  negative for PE   Code Status : Full  Family Communication  : niece at [place]  Disposition Plan  : Pending further workup  Consults  :  Hematology  Procedures  : None  DVT Prophylaxis  :   SCDs   Lab Results  Component Value Date   PLT 61 (L) 07/15/2017  Antibiotics  :    Anti-infectives    Start     Dose/Rate Route Frequency Ordered Stop   07/15/17 1200  ceFEPIme (MAXIPIME) 1 g in dextrose 5 % 50 mL IVPB     1 g 100 mL/hr over 30 Minutes Intravenous Every 8 hours 07/15/17 1015     07/15/17 0300  ampicillin (OMNIPEN) 2 g in sodium chloride 0.9 % 50 mL IVPB  Status:  Discontinued     2 g 150 mL/hr over 20 Minutes Intravenous Every 4 hours 07/15/17 0256 07/15/17 1012   07/15/17 0200  vancomycin (VANCOCIN) 500 mg in sodium chloride 0.9 % 100 mL IVPB     500 mg 100 mL/hr over 60 Minutes Intravenous Every 12 hours 07/15/17 0115     07/15/17 0200  cefTRIAXone (ROCEPHIN) 1 g in dextrose 5 % 50 mL IVPB  Status:  Discontinued     1 g 100 mL/hr over 30 Minutes Intravenous Every 24 hours 07/15/17 0149 07/15/17 0152   07/15/17 0200  ampicillin (OMNIPEN) 2 g in sodium chloride 0.9 % 50 mL IVPB  Status:  Discontinued     2 g 150 mL/hr over 20 Minutes Intravenous Every 6 hours 07/15/17 0149 07/15/17 0256   07/15/17 0200  cefTRIAXone (ROCEPHIN) 2 g in dextrose 5 % 50 mL IVPB  Status:  Discontinued     2 g 100 mL/hr over 30 Minutes Intravenous Every 12 hours 07/15/17 0153 07/15/17 1012   07/15/17 0115  piperacillin-tazobactam (ZOSYN) IVPB 3.375 g  Status:  Discontinued     3.375 g 100 mL/hr over 30 Minutes Intravenous  Once 07/15/17 0113 07/15/17 0153        Objective:   Vitals:   07/15/17 0137 07/15/17 0200 07/15/17 0230 07/15/17 0331  BP: (!) 106/57 (!) 104/58 (!) 117/55 114/81  Pulse: 62 63 64 (!) 56  Resp: 16 (!) 23 16   Temp:      TempSrc:      SpO2: 99% 97% 98% 100%  Weight:    61.8 kg (136 lb 3.9 oz)  Height:    4\' 11"  (1.499 m)    Wt Readings from Last 3  Encounters:  07/15/17 61.8 kg (136 lb 3.9 oz)  11/06/16 55.5 kg (122 lb 6.4 oz)  03/25/16 55.1 kg (121 lb 6.4 oz)     Intake/Output Summary (Last 24 hours) at 07/15/17 1418 Last data filed at 07/15/17 1200  Gross per 24 hour  Intake           841.25 ml  Output                0 ml  Net           841.25 ml     Physical Exam  Awake Alert, Oriented X 3,  Symmetrical Chest wall movement, Good air movement bilaterally, CTAB RRR,No Gallops,Rubs or new Murmurs, No Parasternal Heave +ve B.Sounds, Abd Soft, No tenderness, No rebound - guarding or rigidity. No Cyanosis, Clubbing or edema, No new Rash or bruise     Data Review:    CBC  Recent Labs Lab 07/14/17 1739 07/15/17 0011 07/15/17 0547  WBC 3.4*  --  2.8*  HGB 11.1*  --  9.7*  HCT 34.1*  --  30.1*  PLT 94* 68* 61*  MCV 64.3*  --  64.5*  MCH 20.9*  --  20.8*  MCHC 32.6  --  32.2  RDW 15.4  --  15.6*  LYMPHSABS 1.0  --   --  MONOABS 0.3  --   --   EOSABS 0.0  --   --   BASOSABS 0.1  --   --     Chemistries   Recent Labs Lab 07/14/17 1739 07/15/17 0456  NA 141 144  K 4.0 3.5  CL 109 115*  CO2 22 22  GLUCOSE 111* 94  BUN 20 15  CREATININE 0.56 0.56  CALCIUM 8.6* 7.4*  MG  --  1.6*  AST 78* 83*  ALT 51 51  ALKPHOS 63 48  BILITOT 0.5 0.5   ------------------------------------------------------------------------------------------------------------------ No results for input(s): CHOL, HDL, LDLCALC, TRIG, CHOLHDL, LDLDIRECT in the last 72 hours.  No results found for: HGBA1C ------------------------------------------------------------------------------------------------------------------  Recent Labs  07/15/17 0500  TSH 2.525   ------------------------------------------------------------------------------------------------------------------  Recent Labs  07/15/17 0530 07/15/17 0547  VITAMINB12 447  --   FOLATE 27.0  --   FERRITIN 1,379*  --   TIBC 196*  --   IRON 24*  --   RETICCTPCT  --   <0.4*    Coagulation profile  Recent Labs Lab 07/14/17 2236 07/15/17 0011  INR 0.85 0.99     Recent Labs  07/15/17 0011  DDIMER 1.21*    Cardiac Enzymes No results for input(s): CKMB, TROPONINI, MYOGLOBIN in the last 168 hours.  Invalid input(s): CK ------------------------------------------------------------------------------------------------------------------    Component Value Date/Time   BNP 42.0 03/11/2016 2351    Inpatient Medications  Scheduled Meds: . atorvastatin  40 mg Oral q1800  . ezetimibe  10 mg Oral Daily   Continuous Infusions: . ceFEPime (MAXIPIME) IV Stopped (07/15/17 1225)  . vancomycin Stopped (07/15/17 1044)   PRN Meds:.acetaminophen **OR** acetaminophen, ondansetron **OR** ondansetron (ZOFRAN) IV  Micro Results Recent Results (from the past 240 hour(s))  Respiratory Panel by PCR     Status: None   Collection Time: 07/14/17 11:49 PM  Result Value Ref Range Status   Adenovirus NOT DETECTED NOT DETECTED Final   Coronavirus 229E NOT DETECTED NOT DETECTED Final   Coronavirus HKU1 NOT DETECTED NOT DETECTED Final   Coronavirus NL63 NOT DETECTED NOT DETECTED Final   Coronavirus OC43 NOT DETECTED NOT DETECTED Final   Metapneumovirus NOT DETECTED NOT DETECTED Final   Rhinovirus / Enterovirus NOT DETECTED NOT DETECTED Final   Influenza A NOT DETECTED NOT DETECTED Final   Influenza B NOT DETECTED NOT DETECTED Final   Parainfluenza Virus 1 NOT DETECTED NOT DETECTED Final   Parainfluenza Virus 2 NOT DETECTED NOT DETECTED Final   Parainfluenza Virus 3 NOT DETECTED NOT DETECTED Final   Parainfluenza Virus 4 NOT DETECTED NOT DETECTED Final   Respiratory Syncytial Virus NOT DETECTED NOT DETECTED Final   Bordetella pertussis NOT DETECTED NOT DETECTED Final   Chlamydophila pneumoniae NOT DETECTED NOT DETECTED Final   Mycoplasma pneumoniae NOT DETECTED NOT DETECTED Final    Comment: Performed at [facility], [address], [place], [place]  [address]  CSF culture     Status: None (Preliminary result)   Collection Time: 07/15/17 12:51 AM  Result Value Ref Range Status   Specimen Description BACK  Final   Special Requests Normal  Final   Gram Stain   Final    NO ORGANISMS SEEN NO WBC SEEN CYTOSPIN SMEAR Gram Stain Report Called to,Read Back By and Verified With: Nathaniel Man [phone] @ 0300 BY J SCOTTON    Culture PENDING  Incomplete   Report Status PENDING  Incomplete    Radiology Reports Dg Chest 2 View  Result Date: 07/14/2017 CLINICAL  DATA:  Cough and fever EXAM: CHEST  2 VIEW COMPARISON:  03/11/2016, 01/01/2012 FINDINGS: No focal consolidation or effusion. Mild to moderate cardiomegaly. Aortic atherosclerosis. No pneumothorax. Degenerative changes of the spine. Surgical clips in the upper abdomen. IMPRESSION: Cardiomegaly without edema or focal pulmonary infiltrate. Electronically Signed   By: Jasmine Pang M.D.   On: 07/14/2017 22:24   Ct Head Wo Contrast  Result Date: 07/14/2017 CLINICAL DATA:  Altered mental status EXAM: CT HEAD WITHOUT CONTRAST TECHNIQUE: Contiguous axial images were obtained from the base of the skull through the vertex without intravenous contrast. COMPARISON:  None. FINDINGS: Brain: No mass lesion, intraparenchymal hemorrhage or extra-axial collection. No evidence of acute cortical infarct. Brain parenchyma and CSF-containing spaces are normal for age. Small calcification at the superior left frontal convexity could be a small meningioma. No abnormality of the underlying brain. Incidentally noted partially empty sella. Vascular: No hyperdense vessel or unexpected calcification. Skull: Normal visualized skull base, calvarium and extracranial soft tissues. Sinuses/Orbits: No sinus fluid levels or advanced mucosal thickening. No mastoid effusion. Normal orbits. IMPRESSION: 1. No acute intracranial abnormality. Normal appearance of the brain for age. 2. Extra-axial, dome-shaped calcification along the left  frontal convexity is likely a small meningioma. No abnormality of the underlying brain. Electronically Signed   By: Deatra Robinson M.D.   On: 07/14/2017 22:49   US Abdomen Complete  Result Date: 07/15/2017 CLINICAL DATA:  Thrombocytopenia EXAM: ABDOMEN ULTRASOUND COMPLETE COMPARISON:  05/27/2011 FINDINGS: Gallbladder: Prior cholecystectomy Common bile duct: Diameter: Prominent at 8 mm, compatible with patient's age and post cholecystectomy state. Liver: Increased echotexture compatible with fatty infiltration. No focal abnormality or biliary ductal dilatation. Portal vein is patent on color Doppler imaging with normal direction of blood flow towards the liver. IVC: No abnormality visualized. Pancreas: Visualized portion unremarkable. Spleen: Size and appearance within normal limits. Right Kidney: Length: 10.8 cm. Echogenicity within normal limits. No mass or hydronephrosis visualized. Left Kidney: Length: 10.9 cm. Echogenicity within normal limits. No mass or hydronephrosis visualized. Abdominal aorta: No aneurysm visualized. Other findings: None. IMPRESSION: Fatty infiltration of the liver. Prior cholecystectomy. No acute findings. Electronically Signed   By: Charlett Nose M.D.   On: 07/15/2017 08:09   Nm Pulmonary Perf And Vent  Result Date: 07/15/2017 CLINICAL DATA:  Nausea, vomiting, fever, chills, headache, hemoptysis mild-to-moderate, recent travel EXAM: NUCLEAR MEDICINE VENTILATION - PERFUSION LUNG SCAN TECHNIQUE: Ventilation images were obtained in multiple projections using inhaled aerosol Tc-26m DTPA. Perfusion images were obtained in multiple projections after intravenous injection of Tc-30m MAA. RADIOPHARMACEUTICALS:  33 mCi Technetium-60m DTPA aerosol inhalation and 4.4 mCi Technetium-72m MAA IV COMPARISON:  None Radiographic correlation:  Chest radiograph 07/14/2017 FINDINGS: Ventilation: Central airway deposition of aerosol. Cardiomegaly. No definite ventilation defects. Perfusion:  Cardiomegaly.  No definite perfusion defects. Chest radiograph:  Enlargement of cardiac silhouette IMPRESSION: Enlargement of cardiac silhouette. Otherwise normal ventilation and perfusion lung scan. Electronically Signed   By: Ulyses Southward M.D.   On: 07/15/2017 11:46    Time Spent in minutes  No  charge   Randol Kern, Aydon Swamy M.D on 07/15/2017 at 2:18 PM  Between 7am to 7pm - Pager - [phone]  After 7pm go to [email or web address] - password [email or web address]  [facility] -  Office  [phone]

## 2017-07-15 NOTE — ED Notes (Signed)
Call report to Melanie [phone] at 2:30 am

## 2017-07-15 NOTE — Consult Note (Signed)
[facility] CONSULT NOTE  Patient Care Team: Nolene Ebbs, MD as PCP - General (Internal Medicine)  CHIEF COMPLAINTS/PURPOSE OF CONSULTATION:  Serous with pancytopenia  HISTORY OF PRESENTING ILLNESS:  Tammy Murillo 71 y.o. female is admitted to the hospital after having come to [place] from [place] with multiple symptoms of severe fatigue, dizziness and lightheadedness, headaches, fevers and nausea and vomiting. She has a prior history of coronary artery disease non-ST elevation MI hypertension and hyperlipidemia. She does not understand English and the discussion is with the help of her daughter. She denies anyone else sick in the household. She has not noticed any excessive bruising or bleeding symptoms. Upon admission, patient had a white count of 2.8 and hemoglobin of 9.7 and a platelet count of 61. Differential was not performed. Rest of the liver and kidney function panel was normal. In the emergency room she had a lumbar puncture which did not show any increase in the white blood cell count. LDH was mildly elevated at 201. Iron studies revealed 12% iron saturation with a ferritin of 1000 379 suggestive of acute inflammation. Reticulocytes were markedly reduced. Extensive workup for parasitic respiratory pathogens influenza have been performed and the results are negative.  I reviewed her records extensively and collaborated the history with the patient.  MEDICAL HISTORY:  Past Medical History:  Diagnosis Date  . Anxiety   . Borderline hypertension   . CAD S/P percutaneous coronary angioplasty 12/31/2011   3 sequential LAD lesions treated with a single Promus Element DES; - Myoview 02/2016: No infarct or ischemia. EF 60-65%.  . Costochondritis   . Dyslipidemia, goal LDL below 70   . H/O echocardiogram April 2014   Normal EF: 55-60%, no regional WMA, grade 1 diastolic dysfunction. Mild MR  . History of colon cancer  2010  . History of: Non-ST elevation MI (NSTEMI)  12/31/2011   Severe LAD lesions: Sequential 90%, 70% and 60%; no other significant stenoses. She had had a normal Myoview in 2012.  . Osteoarthritis   . Presence of drug coated stent in LAD coronary artery 01/01/2012   Promus Element DES 2.75 mm x38 mm postdilated to 3.0 mm    SURGICAL HISTORY: Past Surgical History:  Procedure Laterality Date  . CHOLECYSTECTOMY  2004  . CORONARY ANGIOPLASTY WITH STENT PLACEMENT  01/01/2012   Promus Element DES 2.75 MM by 30 MM, postdilated to 3.0 MM  . LEFT HEART CATHETERIZATION WITH CORONARY ANGIOGRAM N/A 01/01/2012   Procedure: LEFT HEART CATHETERIZATION WITH CORONARY ANGIOGRAM;  Surgeon: Troy Sine, MD;  Location[facility] CATH LAB;  Service: Cardiovascular;  Laterality: N/A;  . NM MYOVIEW LTD  September 2014   No ischemia or infarction. EF 70%  . NM MYOVIEW LTD  02/2016    LOW RISK study. No ischemia or infarction. EF 60-65%.  Marland Kitchen PERCUTANEOUS CORONARY STENT INTERVENTION (PCI-S)  01/01/2012   Procedure: PERCUTANEOUS CORONARY STENT INTERVENTION (PCI-S);  Surgeon: Troy Sine, MD;  Location: [facility] CATH LAB;  Service: Cardiovascular;;  . TRANSTHORACIC ECHOCARDIOGRAM  April 2014   EF 55%. Grade 1 diastolic dysfunction. Mild MR    SOCIAL HISTORY: Social History   Social History  . Marital status: Married    Spouse name: N/A  . Number of children: N/A  . Years of education: N/A   Occupational History  . Not on file.   Social History Main Topics  . Smoking status: Never Smoker  . Smokeless tobacco: Never Used  . Alcohol use No  .  Drug use: No  . Sexual activity: No   Other Topics Concern  . Not on file   Social History Narrative   She is a native [place] woman. She is married with 5 children and 7 grandchildren. One of her daughters or her granddaughters usually comes as an Astronomer. She does not speak any [place].   The granddaughter with her today notes that the patient is significantly prone to anxiety and stress related to family issues.  Most of her symptoms are related to stressful situations in house.   She does not smoke or drink. She does not routinely exercise.    FAMILY HISTORY: Family History  Problem Relation Age of Onset  . Coronary artery disease Mother     ALLERGIES:  has No Known Allergies.  MEDICATIONS:  Current Facility-Administered Medications  Medication Dose Route Frequency Provider Last Rate Last Dose  . acetaminophen (TYLENOL) tablet 650 mg  650 mg Oral Q6H PRN Toy Baker, MD       Or  . acetaminophen (TYLENOL) suppository 650 mg  650 mg Rectal Q6H PRN Doutova, Anastassia, MD      . atorvastatin (LIPITOR) tablet 40 mg  40 mg Oral q1800 Doutova, Anastassia, MD      . ceFEPIme (MAXIPIME) 1 g in dextrose 5 % 50 mL IVPB  1 g Intravenous Q8H Luiz Ochoa, RPH   Stopped at 07/15/17 1225  . ezetimibe (ZETIA) tablet 10 mg  10 mg Oral Daily Doutova, Anastassia, MD   10 mg at 07/15/17 0944  . ondansetron (ZOFRAN) tablet 4 mg  4 mg Oral Q6H PRN Toy Baker, MD       Or  . ondansetron (ZOFRAN) injection 4 mg  4 mg Intravenous Q6H PRN Doutova, Anastassia, MD      . vancomycin (VANCOCIN) 500 mg in sodium chloride 0.9 % 100 mL IVPB  500 mg Intravenous Q12H Dorrene German, RPH   Stopped at 07/15/17 1044    REVIEW OF SYSTEMS:   Constitutional: Fatigue, fevers, dizziness, headaches Eyes: Denies any blurring of vision Ears, nose, mouth, throat, and face: Slight neck pain especially at the ear lobule Respiratory: Intermittent cough without expectoration Cardiovascular: Denies palpitation, chest discomfort or lower extremity swelling Gastrointestinal:  Nausea Skin: Denies abnormal skin rashes Lymphatics: Denies new lymphadenopathy or easy bruising Neurological: Generalized weaknesses Behavioral/Psych: Mood is stable, no new changes  All other systems were reviewed with the patient and are negative.  PHYSICAL EXAMINATION: ECOG PERFORMANCE STATUS: 2 - Symptomatic, <50% confined to  bed  Vitals:   07/15/17 0331 07/15/17 1400  BP: 114/81 (!) 110/55  Pulse: (!) 56 65  Resp:  16  Temp:  99.2 F (37.3 C)  SpO2: 100% 100%   Filed Weights   07/14/17 1722 07/15/17 0331  Weight: 128 lb (58.1 kg) 136 lb 3.9 oz (61.8 kg)    GENERAL:alert, no distress and comfortable SKIN: skin color, texture, turgor are normal, no rashes or significant lesions EYES: normal, conjunctiva are pink and non-injected, sclera clear OROPHARYNX:no exudate, no erythema and lips, buccal mucosa, and tongue normal  NECK: No palpable cervical lymph nodes but there is tenderness of the left ear lobule LYMPH:  no palpable lymphadenopathy in the cervical, axillary or inguinal LUNGS: clear to auscultation and percussion with normal breathing effort HEART: regular rate & rhythm and no murmurs and no lower extremity edema ABDOMEN:abdomen soft, non-tender and normal bowel sounds, no hepatosplenomegaly Musculoskeletal:no cyanosis of digits and no clubbing  PSYCH: alert & oriented x 3  with fluent speech NEURO: no focal motor/sensory deficits   LABORATORY DATA:  I have reviewed the data as listed Lab Results  Component Value Date   WBC 2.8 (L) 07/15/2017   HGB 9.7 (L) 07/15/2017   HCT 30.1 (L) 07/15/2017   MCV 64.5 (L) 07/15/2017   PLT 61 (L) 07/15/2017   Lab Results  Component Value Date   NA 144 07/15/2017   K 3.5 07/15/2017   CL 115 (H) 07/15/2017   CO2 22 07/15/2017    RADIOGRAPHIC STUDIES: I have personally reviewed the radiological reports and agreed with the findings in the report.  ASSESSMENT AND PLAN:  1. Mild pancytopenia: White count 2.8, platelets 61, hemoglobin 9.7. The anemia is microcytic. I believe the degree of anemia is discordant with the MCV. I suspected that she may have thalassemia. D-05 and folic acid were normal. Reticulocytes are markedly diminished suggestive of bone marrow suppression.  I would like to obtain a differential on the white blood cell count. I  reviewed the peripheral smear from yesterday and it did not show any schistocytes. The DIC panel did not reveal any evidence of coagulopathy. This rules out the DIC and TTP. I strongly suspect that this may be related to an underlying viral or bacterial illness. At this point we will continue to watch and monitor.  2. fevers with headaches, dizziness, nausea and vomiting VP scan was performed which did not show any suspicion for PE Ultrasound abdomen showed fatty infiltration of liver prior cholecystectomy CT head 07/14/2017:no acute abnormalities noted Chest x-ray was normal.  If she has profound worsening of her blood counts then we will consider performing a bone marrow biopsy. Thank you for consulting [place].   All questions were answered. The patient knows to call the clinic with any problems, questions or concerns.    Rulon Eisenmenger, MD '@T' @

## 2017-07-15 NOTE — Progress Notes (Signed)
Pharmacy Antibiotic Note  Tammy Murillo is a 71 y.o. female with HA, nausea and diarrhea admitted on 07/14/2017 with sepsis.  Pharmacy initially consulted for Vancomycin, Ampicillin, and Ceftriaxone for r/o meningitis. Per MD, does not appear to have meningitis per LP and clinical picture. Ampicillin and Ceftriaxone changed to Cefepime for now for sepsis of unknown etiology per MD. Pharmacy consulted to assist with dosing.   Plan: Cefepime 1g IV q8h.  Continue Vancomycin 500 mg IV q12h. Vancomycin trough level at steady state, as indicated.  Monitor renal function, cultures, clinical course.   Height: 4\' 11"  (149.9 cm) Weight: 136 lb 3.9 oz (61.8 kg) IBW/kg (Calculated) : 43.2  Temp (24hrs), Avg:99.3 F (37.4 C), Min:98.9 F (37.2 C), Max:99.6 F (37.6 C)   Recent Labs Lab 07/14/17 1739 07/14/17 2330 07/15/17 0224 07/15/17 0456 07/15/17 0547 07/15/17 0816  WBC 3.4*  --   --   --  2.8*  --   CREATININE 0.56  --   --  0.56  --   --   LATICACIDVEN  --  1.11 0.61  --  0.8 1.1    Estimated Creatinine Clearance: 50.8 mL/min (by C-G formula based on SCr of 0.56 mg/dL).    No Known Allergies  Antimicrobials this admission: 10/18 ceftriaxone >> 10/18 10/18 ampicillin >> 10/18 10/18 vancomycin >>  10/18 cefepime >>  Dose adjustments this admission: --  Microbiology results: 10/17 BCx: sent 10/17 respiratory panel by PCR: negative 10/18 UCx: sent  10/18 CSF cx: IP 10/18 HIV antibody: IP  Thank you for allowing pharmacy to be a part of this patient's care.   Greer PickerelJigna Khole Arterburn, PharmD, BCPS Pager: [phone] 07/15/2017 10:15 AM

## 2017-07-16 ENCOUNTER — Inpatient Hospital Stay (HOSPITAL_COMMUNITY): Payer: Medicare Other

## 2017-07-16 LAB — CBC WITH DIFFERENTIAL/PLATELET
BASOS PCT: 3 %
Basophils Absolute: 0.1 10*3/uL (ref 0.0–0.1)
Eosinophils Absolute: 0.1 10*3/uL (ref 0.0–0.7)
Eosinophils Relative: 2 %
HEMATOCRIT: 33.6 % — AB (ref 36.0–46.0)
HEMOGLOBIN: 11.1 g/dL — AB (ref 12.0–15.0)
LYMPHS ABS: 1.7 10*3/uL (ref 0.7–4.0)
LYMPHS PCT: 47 %
MCH: 21.3 pg — AB (ref 26.0–34.0)
MCHC: 33 g/dL (ref 30.0–36.0)
MCV: 64.4 fL — AB (ref 78.0–100.0)
MONOS PCT: 15 %
Monocytes Absolute: 0.6 10*3/uL (ref 0.1–1.0)
NEUTROS ABS: 1.2 10*3/uL — AB (ref 1.7–7.7)
Neutrophils Relative %: 33 %
Platelets: 98 10*3/uL — ABNORMAL LOW (ref 150–400)
RBC: 5.22 MIL/uL — ABNORMAL HIGH (ref 3.87–5.11)
RDW: 15.4 % (ref 11.5–15.5)
WBC: 3.7 10*3/uL — ABNORMAL LOW (ref 4.0–10.5)

## 2017-07-16 LAB — BLOOD CULTURE ID PANEL (REFLEXED)
ACINETOBACTER BAUMANNII: NOT DETECTED
CANDIDA KRUSEI: NOT DETECTED
CANDIDA PARAPSILOSIS: NOT DETECTED
Candida albicans: NOT DETECTED
Candida glabrata: NOT DETECTED
Candida tropicalis: NOT DETECTED
ESCHERICHIA COLI: NOT DETECTED
Enterobacter cloacae complex: NOT DETECTED
Enterobacteriaceae species: NOT DETECTED
Enterococcus species: NOT DETECTED
Haemophilus influenzae: NOT DETECTED
KLEBSIELLA OXYTOCA: NOT DETECTED
KLEBSIELLA PNEUMONIAE: NOT DETECTED
Listeria monocytogenes: NOT DETECTED
Methicillin resistance: DETECTED — AB
Neisseria meningitidis: NOT DETECTED
PROTEUS SPECIES: NOT DETECTED
PSEUDOMONAS AERUGINOSA: NOT DETECTED
SERRATIA MARCESCENS: NOT DETECTED
STAPHYLOCOCCUS AUREUS BCID: NOT DETECTED
STREPTOCOCCUS PNEUMONIAE: NOT DETECTED
Staphylococcus species: DETECTED — AB
Streptococcus agalactiae: NOT DETECTED
Streptococcus pyogenes: NOT DETECTED
Streptococcus species: NOT DETECTED

## 2017-07-16 LAB — BASIC METABOLIC PANEL
ANION GAP: 9 (ref 5–15)
BUN: 8 mg/dL (ref 6–20)
CO2: 24 mmol/L (ref 22–32)
Calcium: 8.1 mg/dL — ABNORMAL LOW (ref 8.9–10.3)
Chloride: 106 mmol/L (ref 101–111)
Creatinine, Ser: 0.45 mg/dL (ref 0.44–1.00)
GFR calc Af Amer: >60 mL/min (ref >60)
GFR calc non Af Amer: >60 mL/min (ref >60)
GLUCOSE: 113 mg/dL — AB (ref 65–99)
POTASSIUM: 3.5 mmol/L (ref 3.5–5.1)
Sodium: 139 mmol/L (ref 135–145)

## 2017-07-16 LAB — URINE CULTURE: Culture: NO GROWTH

## 2017-07-16 LAB — HAPTOGLOBIN: Haptoglobin: 228 mg/dL — ABNORMAL HIGH (ref 34–200)

## 2017-07-16 MED ORDER — PANTOPRAZOLE SODIUM 40 MG PO TBEC
40.0000 mg | DELAYED_RELEASE_TABLET | Freq: Every day | ORAL | Status: DC
  Administered 2017-07-16 – 2017-07-17 (×2): 40 mg via ORAL
  Filled 2017-07-16 (×2): qty 1

## 2017-07-16 MED ORDER — POTASSIUM CHLORIDE CRYS ER 20 MEQ PO TBCR
40.0000 meq | EXTENDED_RELEASE_TABLET | Freq: Once | ORAL | Status: AC
  Administered 2017-07-16: 40 meq via ORAL
  Filled 2017-07-16: qty 2

## 2017-07-16 MED ORDER — ASPIRIN EC 81 MG PO TBEC
81.0000 mg | DELAYED_RELEASE_TABLET | Freq: Every day | ORAL | Status: DC
  Administered 2017-07-16 – 2017-07-17 (×2): 81 mg via ORAL
  Filled 2017-07-16 (×2): qty 1

## 2017-07-16 MED ORDER — METOPROLOL TARTRATE 25 MG PO TABS
12.5000 mg | ORAL_TABLET | Freq: Two times a day (BID) | ORAL | Status: DC
  Administered 2017-07-16 – 2017-07-17 (×3): 12.5 mg via ORAL
  Filled 2017-07-16 (×3): qty 1

## 2017-07-16 NOTE — Progress Notes (Signed)
Patient had episode of hemoptysis, 30cc. VS stable 76, 136/62, 18, 99% ra. Provider updated.

## 2017-07-16 NOTE — Progress Notes (Addendum)
PROGRESS NOTE                                                                                                                                                                                                             Patient Demographics:    Tammy Murillo, is a 71 y.o. female, DOB - 10-12-1945, ZOX:[id number]  Admit date - 07/14/2017   Admitting Physician [name], MD  Outpatient Primary MD for the patient is Fleet Contras, MD  LOS - 1   Chief Complaint  Patient presents with  . Nausea  . Headache  . Diarrhea       Brief Narrative   71 year old female with past medical history of CAD, hypertension, hyperlipidemia, with recent visit to [place] back [place] last week, he presents with fatigue headache and fever, workup significant for pancytopenia, dmitted for further workup, he remains afebrile during hospital stay, LP with no evidence of meningitis, as one out of 4 blood culture growing gram-positive cocci, most likely contaminant,, seen by hematology, pancytopenia gradually improving.   Subjective:    Tammy Murillo todaywith no significant events overnight, nausea, no vomiting, no diarrhea.   Assessment  & Plan :    Active Problems:   Dyslipidemia, goal LDL below 70   CAD S/P percutaneous coronary angioplasty - DES to LAD April 2013   Thrombocytopenia (HCC)   Leucopenia   Pancytopenia (HCC)   Cardiomegaly   Anemia   Chronic diastolic CHF (congestive heart failure) (HCC)   SIRS (systemic inflammatory response syndrome) (HCC)   Mild pancytopenia - appears to be new onset,Hematology input greatly appreciated, there is no evidence of DIC or TTP, no evidence of coagulopathy, it is most likely related to infectious process viral versus bacterial, improving.  SIRS/ 1 out of 4 positive blood cultures - Cultures were sent on admission, 1 over 4 bottles growing gram-positive cocci, this is most likely contaminant, but for now I  will continue with IV vancomycin cultures are final,cefepime will be stopped today  Chronic diastolic CHF - Patient's appears to be euvolemic, 2-D echo during hospital stay with preserved EF of 55%  Dyslipidemia - continue with home medication  Hypertension - Blood pressure on the soft side, continue to hold amlodipine, started metoprolol at lower dose, 25->12.5 mg twice a day  History of CAD - continue with aspirin, statin,  and beta blocker , holding Imdur given soft blood pressure  Headache - reports significantly subsided, LP negative for infectious process, so I have stopped ampicillin and Rocephin.  Hemoptysis - Patient had one episode of blood-tinged sputum, given her recent travel to [place], I have obtained a VQ scan which is negative for PE, she had another episode of diuresis today, I will check CT chest without contrast   Code Status : Full  Family Communication  : discussed with daughter via phone  Disposition Plan  : home when stable  Consults  :  Hematology/oncology  Procedures  : None  DVT Prophylaxis  :   SCDs giving her thrombocytopenia  Lab Results  Component Value Date   PLT 98 (L) 07/16/2017    Antibiotics  :    Anti-infectives    Start     Dose/Rate Route Frequency Ordered Stop   07/15/17 1200  ceFEPIme (MAXIPIME) 1 g in dextrose 5 % 50 mL IVPB  Status:  Discontinued     1 g 100 mL/hr over 30 Minutes Intravenous Every 8 hours 07/15/17 1015 07/16/17 0934   07/15/17 0300  ampicillin (OMNIPEN) 2 g in sodium chloride 0.9 % 50 mL IVPB  Status:  Discontinued     2 g 150 mL/hr over 20 Minutes Intravenous Every 4 hours 07/15/17 0256 07/15/17 1012   07/15/17 0200  vancomycin (VANCOCIN) 500 mg in sodium chloride 0.9 % 100 mL IVPB     500 mg 100 mL/hr over 60 Minutes Intravenous Every 12 hours 07/15/17 0115     07/15/17 0200  cefTRIAXone (ROCEPHIN) 1 g in dextrose 5 % 50 mL IVPB  Status:  Discontinued     1 g 100 mL/hr over 30 Minutes Intravenous Every  24 hours 07/15/17 0149 07/15/17 0152   07/15/17 0200  ampicillin (OMNIPEN) 2 g in sodium chloride 0.9 % 50 mL IVPB  Status:  Discontinued     2 g 150 mL/hr over 20 Minutes Intravenous Every 6 hours 07/15/17 0149 07/15/17 0256   07/15/17 0200  cefTRIAXone (ROCEPHIN) 2 g in dextrose 5 % 50 mL IVPB  Status:  Discontinued     2 g 100 mL/hr over 30 Minutes Intravenous Every 12 hours 07/15/17 0153 07/15/17 1012   07/15/17 0115  piperacillin-tazobactam (ZOSYN) IVPB 3.375 g  Status:  Discontinued     3.375 g 100 mL/hr over 30 Minutes Intravenous  Once 07/15/17 0113 07/15/17 0153        Objective:   Vitals:   07/15/17 0331 07/15/17 1400 07/15/17 2145 07/16/17 0547  BP: 114/81 (!) 110/55 116/68 105/62  Pulse: (!) 56 65 70 70  Resp:  16 18 18   Temp:  99.2 F (37.3 C) 99.1 F (37.3 C) 98.2 F (36.8 C)  TempSrc:  Oral Oral Oral  SpO2: 100% 100% 98% 98%  Weight: 61.8 kg (136 lb 3.9 oz)     Height: 4\' 11"  (1.499 m)       Wt Readings from Last 3 Encounters:  07/15/17 61.8 kg (136 lb 3.9 oz)  11/06/16 55.5 kg (122 lb 6.4 oz)  03/25/16 55.1 kg (121 lb 6.4 oz)     Intake/Output Summary (Last 24 hours) at 07/16/17 1202 Last data filed at 07/16/17 0200  Gross per 24 hour  Intake              390 ml  Output                0 ml  Net  390 ml     Physical Exam  Awake, alert oriented laying comfortably in bed in no apparent distress With air entry bilaterally, no wheezing rales rhonchi RRR,no rubs, murmur or gallops +ve B.Sounds, Abd Soft, No tenderness, No rebound - guarding or rigidity. Extremities with no edema, clubbing or cyanosis, pulses felt    Data Review:    CBC  Recent Labs Lab 07/14/17 1739 07/15/17 0011 07/15/17 0547 07/16/17 0506  WBC 3.4*  --  2.8* 3.7*  HGB 11.1*  --  9.7* 11.1*  HCT 34.1*  --  30.1* 33.6*  PLT 94* 68* 61* 98*  MCV 64.3*  --  64.5* 64.4*  MCH 20.9*  --  20.8* 21.3*  MCHC 32.6  --  32.2 33.0  RDW 15.4  --  15.6* 15.4    LYMPHSABS 1.0  --   --  1.7  MONOABS 0.3  --   --  0.6  EOSABS 0.0  --   --  0.1  BASOSABS 0.1  --   --  0.1    Chemistries   Recent Labs Lab 07/14/17 1739 07/15/17 0456 07/16/17 0506  NA 141 144 139  K 4.0 3.5 3.5  CL 109 115* 106  CO2 22 22 24   GLUCOSE 111* 94 113*  BUN 20 15 8   CREATININE 0.56 0.56 0.45  CALCIUM 8.6* 7.4* 8.1*  MG  --  1.6*  --   AST 78* 83*  --   ALT 51 51  --   ALKPHOS 63 48  --   BILITOT 0.5 0.5  --    ------------------------------------------------------------------------------------------------------------------ No results for input(s): CHOL, HDL, LDLCALC, TRIG, CHOLHDL, LDLDIRECT in the last 72 hours.  No results found for: HGBA1C ------------------------------------------------------------------------------------------------------------------  Recent Labs  07/15/17 0500  TSH 2.525   ------------------------------------------------------------------------------------------------------------------  Recent Labs  07/15/17 0530 07/15/17 0547  VITAMINB12 447  --   FOLATE 27.0  --   FERRITIN 1,379*  --   TIBC 196*  --   IRON 24*  --   RETICCTPCT  --  <0.4*    Coagulation profile  Recent Labs Lab 07/14/17 2236 07/15/17 0011  INR 0.85 0.99     Recent Labs  07/15/17 0011  DDIMER 1.21*    Cardiac Enzymes No results for input(s): CKMB, TROPONINI, MYOGLOBIN in the last 168 hours.  Invalid input(s): CK ------------------------------------------------------------------------------------------------------------------    Component Value Date/Time   BNP 42.0 03/11/2016 2351    Inpatient Medications  Scheduled Meds: . atorvastatin  40 mg Oral q1800  . potassium chloride  40 mEq Oral Once   Continuous Infusions: . vancomycin 500 mg (07/16/17 1000)   PRN Meds:.acetaminophen **OR** acetaminophen, ondansetron **OR** ondansetron (ZOFRAN) IV  Micro Results Recent Results (from the past 240 hour(s))  Blood culture (routine  x 2)     Status: None (Preliminary result)   Collection Time: 07/14/17 10:07 PM  Result Value Ref Range Status   Specimen Description BLOOD RIGHT ANTECUBITAL  Final   Special Requests   Final    BOTTLES DRAWN AEROBIC AND ANAEROBIC Blood Culture adequate volume   Culture  Setup Time   Final    GRAM POSITIVE COCCI IN CLUSTERS AEROBIC BOTTLE ONLY CRITICAL RESULT CALLED TO, READ BACK BY AND VERIFIED WITH: Abe People [id number] MLM Performed at [facility], [address] [place], [place] [address]    Culture [facility] POSITIVE COCCI  Final   Report Status PENDING  Incomplete  Blood Culture ID Panel (Reflexed)  Status: Abnormal   Collection Time: 07/14/17 10:07 PM  Result Value Ref Range Status   Enterococcus species NOT DETECTED NOT DETECTED Final   Listeria monocytogenes NOT DETECTED NOT DETECTED Final   Staphylococcus species DETECTED (A) NOT DETECTED Final    Comment: Methicillin (oxacillin) resistant coagulase negative staphylococcus. Possible blood culture contaminant (unless isolated from more than one blood culture draw or clinical case suggests pathogenicity). No antibiotic treatment is indicated for blood  culture contaminants. CRITICAL RESULT CALLED TO, READ BACK BY AND VERIFIED WITH: PHARMD N GLOGOVAC [id number] MLM    Staphylococcus aureus NOT DETECTED NOT DETECTED Final   Methicillin resistance DETECTED (A) NOT DETECTED Final    Comment: CRITICAL RESULT CALLED TO, READ BACK BY AND VERIFIED WITH: PHARMD N GLOGOVAC [id number] MLM    Streptococcus species NOT DETECTED NOT DETECTED Final   Streptococcus agalactiae NOT DETECTED NOT DETECTED Final   Streptococcus pneumoniae NOT DETECTED NOT DETECTED Final   Streptococcus pyogenes NOT DETECTED NOT DETECTED Final   Acinetobacter baumannii NOT DETECTED NOT DETECTED Final   Enterobacteriaceae species NOT DETECTED NOT DETECTED Final   Enterobacter cloacae complex NOT DETECTED NOT DETECTED Final   Escherichia coli  NOT DETECTED NOT DETECTED Final   Klebsiella oxytoca NOT DETECTED NOT DETECTED Final   Klebsiella pneumoniae NOT DETECTED NOT DETECTED Final   Proteus species NOT DETECTED NOT DETECTED Final   Serratia marcescens NOT DETECTED NOT DETECTED Final   Haemophilus influenzae NOT DETECTED NOT DETECTED Final   Neisseria meningitidis NOT DETECTED NOT DETECTED Final   Pseudomonas aeruginosa NOT DETECTED NOT DETECTED Final   Candida albicans NOT DETECTED NOT DETECTED Final   Candida glabrata NOT DETECTED NOT DETECTED Final   Candida krusei NOT DETECTED NOT DETECTED Final   Candida parapsilosis NOT DETECTED NOT DETECTED Final   Candida tropicalis NOT DETECTED NOT DETECTED Final    Comment: Performed at [facility], [address], [place], [place] [address]  Respiratory Panel by PCR     Status: None   Collection Time: 07/14/17 11:49 PM  Result Value Ref Range Status   Adenovirus NOT DETECTED NOT DETECTED Final   Coronavirus 229E NOT DETECTED NOT DETECTED Final   Coronavirus HKU1 NOT DETECTED NOT DETECTED Final   Coronavirus NL63 NOT DETECTED NOT DETECTED Final   Coronavirus OC43 NOT DETECTED NOT DETECTED Final   Metapneumovirus NOT DETECTED NOT DETECTED Final   Rhinovirus / Enterovirus NOT DETECTED NOT DETECTED Final   Influenza A NOT DETECTED NOT DETECTED Final   Influenza B NOT DETECTED NOT DETECTED Final   Parainfluenza Virus 1 NOT DETECTED NOT DETECTED Final   Parainfluenza Virus 2 NOT DETECTED NOT DETECTED Final   Parainfluenza Virus 3 NOT DETECTED NOT DETECTED Final   Parainfluenza Virus 4 NOT DETECTED NOT DETECTED Final   Respiratory Syncytial Virus NOT DETECTED NOT DETECTED Final   Bordetella pertussis NOT DETECTED NOT DETECTED Final   Chlamydophila pneumoniae NOT DETECTED NOT DETECTED Final   Mycoplasma pneumoniae NOT DETECTED NOT DETECTED Final    Comment: Performed at [facility], [address] [place], [place] [address]  Urine Culture     Status: None   Collection  Time: 07/15/17 12:25 AM  Result Value Ref Range Status   Specimen Description URINE, CLEAN CATCH  Final   Special Requests NONE  Final   Culture   Final    NO GROWTH Performed at [facility], [address] [place], [place] [address]    Report  Status 07/16/2017 FINAL  Final  CSF culture     Status: None (Preliminary result)   Collection Time: 07/15/17 12:51 AM  Result Value Ref Range Status   Specimen Description BACK  Final   Special Requests Normal  Final   Gram Stain   Final    NO ORGANISMS SEEN NO WBC SEEN CYTOSPIN SMEAR Gram Stain Report Called to,Read Back By and Verified With: Nathaniel Man [phone] @ 0300 BY J SCOTTON    Culture   Final    NO GROWTH 1 DAY Performed at [facility], [address], [place], [place] [address]    Report Status PENDING  Incomplete    Radiology Reports Dg Chest 2 View  Result Date: 07/14/2017 CLINICAL DATA:  Cough and fever EXAM: CHEST  2 VIEW COMPARISON:  03/11/2016, 01/01/2012 FINDINGS: No focal consolidation or effusion. Mild to moderate cardiomegaly. Aortic atherosclerosis. No pneumothorax. Degenerative changes of the spine. Surgical clips in the upper abdomen. IMPRESSION: Cardiomegaly without edema or focal pulmonary infiltrate. Electronically Signed   By: Jasmine Pang M.D.   On: 07/14/2017 22:24   Ct Head Wo Contrast  Result Date: 07/14/2017 CLINICAL DATA:  Altered mental status EXAM: CT HEAD WITHOUT CONTRAST TECHNIQUE: Contiguous axial images were obtained from the base of the skull through the vertex without intravenous contrast. COMPARISON:  None. FINDINGS: Brain: No mass lesion, intraparenchymal hemorrhage or extra-axial collection. No evidence of acute cortical infarct. Brain parenchyma and CSF-containing spaces are normal for age. Small calcification at the superior left frontal convexity could be a small meningioma. No abnormality of the underlying brain. Incidentally noted partially empty sella. Vascular: No  hyperdense vessel or unexpected calcification. Skull: Normal visualized skull base, calvarium and extracranial soft tissues. Sinuses/Orbits: No sinus fluid levels or advanced mucosal thickening. No mastoid effusion. Normal orbits. IMPRESSION: 1. No acute intracranial abnormality. Normal appearance of the brain for age. 2. Extra-axial, dome-shaped calcification along the left frontal convexity is likely a small meningioma. No abnormality of the underlying brain. Electronically Signed   By: Deatra Robinson M.D.   On: 07/14/2017 22:49   US Abdomen Complete  Result Date: 07/15/2017 CLINICAL DATA:  Thrombocytopenia EXAM: ABDOMEN ULTRASOUND COMPLETE COMPARISON:  05/27/2011 FINDINGS: Gallbladder: Prior cholecystectomy Common bile duct: Diameter: Prominent at 8 mm, compatible with patient's age and post cholecystectomy state. Liver: Increased echotexture compatible with fatty infiltration. No focal abnormality or biliary ductal dilatation. Portal vein is patent on color Doppler imaging with normal direction of blood flow towards the liver. IVC: No abnormality visualized. Pancreas: Visualized portion unremarkable. Spleen: Size and appearance within normal limits. Right Kidney: Length: 10.8 cm. Echogenicity within normal limits. No mass or hydronephrosis visualized. Left Kidney: Length: 10.9 cm. Echogenicity within normal limits. No mass or hydronephrosis visualized. Abdominal aorta: No aneurysm visualized. Other findings: None. IMPRESSION: Fatty infiltration of the liver. Prior cholecystectomy. No acute findings. Electronically Signed   By: Charlett Nose M.D.   On: 07/15/2017 08:09   Nm Pulmonary Perf And Vent  Result Date: 07/15/2017 CLINICAL DATA:  Nausea, vomiting, fever, chills, headache, hemoptysis mild-to-moderate, recent travel EXAM: NUCLEAR MEDICINE VENTILATION - PERFUSION LUNG SCAN TECHNIQUE: Ventilation images were obtained in multiple projections using inhaled aerosol Tc-43m DTPA. Perfusion images were  obtained in multiple projections after intravenous injection of Tc-78m MAA. RADIOPHARMACEUTICALS:  33 mCi Technetium-68m DTPA aerosol inhalation and 4.4 mCi Technetium-4m MAA IV COMPARISON:  None Radiographic correlation:  Chest radiograph 07/14/2017 FINDINGS: Ventilation: Central airway deposition of aerosol. Cardiomegaly. No definite ventilation defects. Perfusion:  Cardiomegaly.  No definite perfusion defects. Chest radiograph:  Enlargement of cardiac silhouette IMPRESSION: Enlargement of cardiac silhouette. Otherwise normal ventilation and perfusion lung scan. Electronically Signed   By: Ulyses SouthwardMark  Boles M.D.   On: 07/15/2017 11:46    Time Spent in minutes  25 minutes   Dorthea Maina M.D on 07/16/2017 at 12:02 PM  Between 7am to 7pm - Pager - [phone]  After 7pm go to [email or web address] - password [email or web address]  [facility] -  Office  [phone]

## 2017-07-16 NOTE — Progress Notes (Signed)
PHARMACY - PHYSICIAN COMMUNICATION CRITICAL VALUE ALERT - BLOOD CULTURE IDENTIFICATION (BCID)  Results for orders placed or performed during the hospital encounter of 07/14/17  Blood Culture ID Panel (Reflexed) (Collected: 07/14/2017 10:07 PM)  Result Value Ref Range   Enterococcus species NOT DETECTED NOT DETECTED   Listeria monocytogenes NOT DETECTED NOT DETECTED   Staphylococcus species DETECTED (A) NOT DETECTED   Staphylococcus aureus NOT DETECTED NOT DETECTED   Methicillin resistance DETECTED (A) NOT DETECTED   Streptococcus species NOT DETECTED NOT DETECTED   Streptococcus agalactiae NOT DETECTED NOT DETECTED   Streptococcus pneumoniae NOT DETECTED NOT DETECTED   Streptococcus pyogenes NOT DETECTED NOT DETECTED   Acinetobacter baumannii NOT DETECTED NOT DETECTED   Enterobacteriaceae species NOT DETECTED NOT DETECTED   Enterobacter cloacae complex NOT DETECTED NOT DETECTED   Escherichia coli NOT DETECTED NOT DETECTED   Klebsiella oxytoca NOT DETECTED NOT DETECTED   Klebsiella pneumoniae NOT DETECTED NOT DETECTED   Proteus species NOT DETECTED NOT DETECTED   Serratia marcescens NOT DETECTED NOT DETECTED   Haemophilus influenzae NOT DETECTED NOT DETECTED   Neisseria meningitidis NOT DETECTED NOT DETECTED   Pseudomonas aeruginosa NOT DETECTED NOT DETECTED   Candida albicans NOT DETECTED NOT DETECTED   Candida glabrata NOT DETECTED NOT DETECTED   Candida krusei NOT DETECTED NOT DETECTED   Candida parapsilosis NOT DETECTED NOT DETECTED   Candida tropicalis NOT DETECTED NOT DETECTED    Name of physician (or Provider) Contacted: Dr. Randol KernElgergawy  Changes to prescribed antibiotics: per MD, D/C cefepime, continue Vancomycin for now   Jamse MeadGadhia, Claryssa Sandner M 07/16/2017  10:38 AM

## 2017-07-16 NOTE — Progress Notes (Signed)
Hematology Labs reviewed CBC    Component Value Date/Time   WBC 3.7 (L) 07/16/2017 0506   RBC 5.22 (H) 07/16/2017 0506   HGB 11.1 (L) 07/16/2017 0506   HCT 33.6 (L) 07/16/2017 0506   PLT 98 (L) 07/16/2017 0506   MCV 64.4 (L) 07/16/2017 0506   MCH 21.3 (L) 07/16/2017 0506   MCHC 33.0 07/16/2017 0506   RDW 15.4 07/16/2017 0506   LYMPHSABS 1.7 07/16/2017 0506   MONOABS 0.6 07/16/2017 0506   EOSABS 0.1 07/16/2017 0506   BASOSABS 0.1 07/16/2017 0506     CBC Latest Ref Rng & Units 07/16/2017 07/15/2017 07/15/2017  WBC 4.0 - 10.5 K/uL 3.7(L) 2.8(L) -  Hemoglobin 12.0 - 15.0 g/dL 11.1(L) 9.7(L) -  Hematocrit 36.0 - 46.0 % 33.6(L) 30.1(L) -  Platelets 150 - 400 K/uL 98(L) 61(L) 68(L)   Blood counts are improving spontaneously Please call the on-call hematologist if there are any further questions or concerns

## 2017-07-17 DIAGNOSIS — K219 Gastro-esophageal reflux disease without esophagitis: Secondary | ICD-10-CM

## 2017-07-17 LAB — BASIC METABOLIC PANEL
ANION GAP: 7 (ref 5–15)
BUN: 9 mg/dL (ref 6–20)
CALCIUM: 8.6 mg/dL — AB (ref 8.9–10.3)
CO2: 24 mmol/L (ref 22–32)
Chloride: 108 mmol/L (ref 101–111)
Creatinine, Ser: 0.51 mg/dL (ref 0.44–1.00)
GFR calc Af Amer: >60 mL/min (ref >60)
GLUCOSE: 93 mg/dL (ref 65–99)
Potassium: 4.1 mmol/L (ref 3.5–5.1)
SODIUM: 139 mmol/L (ref 135–145)

## 2017-07-17 LAB — CBC WITH DIFFERENTIAL/PLATELET
BASOS ABS: 0.1 10*3/uL (ref 0.0–0.1)
BASOS PCT: 3 %
EOS ABS: 0.1 10*3/uL (ref 0.0–0.7)
EOS PCT: 2 %
HCT: 31.9 % — ABNORMAL LOW (ref 36.0–46.0)
Hemoglobin: 10.4 g/dL — ABNORMAL LOW (ref 12.0–15.0)
Lymphocytes Relative: 47 %
Lymphs Abs: 2.4 10*3/uL (ref 0.7–4.0)
MCH: 21 pg — ABNORMAL LOW (ref 26.0–34.0)
MCHC: 32.6 g/dL (ref 30.0–36.0)
MCV: 64.3 fL — AB (ref 78.0–100.0)
Monocytes Absolute: 0.7 10*3/uL (ref 0.1–1.0)
Monocytes Relative: 13 %
Neutro Abs: 1.9 10*3/uL (ref 1.7–7.7)
Neutrophils Relative %: 36 %
PLATELETS: 128 10*3/uL — AB (ref 150–400)
RBC: 4.96 MIL/uL (ref 3.87–5.11)
RDW: 15.4 % (ref 11.5–15.5)
WBC: 5.2 10*3/uL (ref 4.0–10.5)

## 2017-07-17 MED ORDER — ONDANSETRON 8 MG PO TBDP: 8.0000 mg | ORAL_TABLET | Freq: Three times a day (TID) | ORAL | 0 refills | Status: AC | PRN

## 2017-07-17 MED ORDER — METOPROLOL TARTRATE 25 MG PO TABS: 12.5000 mg | ORAL_TABLET | Freq: Two times a day (BID) | ORAL | Status: DC

## 2017-07-17 MED ORDER — ACETAMINOPHEN 325 MG PO TABS: 650.0000 mg | ORAL_TABLET | Freq: Four times a day (QID) | ORAL | 0 refills | Status: AC | PRN

## 2017-07-17 NOTE — Progress Notes (Signed)
Please contact daughter, Martie LeeSabrina for update [phone]

## 2017-07-17 NOTE — Discharge Summary (Signed)
Physician Discharge Summary  Tammy Murillo:[id number] DOB: 1946-05-02 DOA: 07/14/2017  PCP: Fleet Contras, MD  Admit date: 07/14/2017 Discharge date: 07/17/2017  Time spent: 35 minutes  Recommendations for Outpatient Follow-up:  1. Reassess BP and adjust antihypertensive regimen as needed  2. Repeat CBC to follow WBC's, Hgb and platelets trend  3. Repeat BMET to follow electrolytes and renal function    Discharge Diagnoses:    SIRS (systemic inflammatory response syndrome) (HCC)   Dyslipidemia, goal LDL below 70   CAD S/P percutaneous coronary angioplasty - DES to LAD April 2013   Pancytopenia (HCC)   Cardiomegaly   Anemia   Chronic diastolic CHF (congestive heart failure) (HCC)   Gastroesophageal reflux disease   Discharge Condition: stable and improved. Patient discharge home with instructions to follow up with PCP in 10 days.  Diet recommendation: heart healthy diet.  Filed Weights   07/14/17 1722 07/15/17 0331  Weight: 58.1 kg (128 lb) 61.8 kg (136 lb 3.9 oz)    History of present illness:  71 year old female with past medical history of CAD, hypertension, hyperlipidemia and chronic diastolic HF; who recently visited [place]; came back to [place] last week and presented with fatigue, headache,N/V and fever, workup significant for pancytopenia. Patient admitted for further workup.  Hospital Course:  SIRS: with 1/4 positive blood cultures to coagulase neg staff. -blood cx most likely contaminant -patient has remained afebrile in house for over 36 hours and is non toxic in appearance  -discharge w/o any further antibiotic coverage -advise to keep herself well hydrated and tp use PRN tylenol for comfort and symptom management -presentation most likely viral illness in nature  -neg Flu PCR  Pancytopenia -associated with viral infection  -resolving/improved at discharge -case discussed with hematology and no further work up is recommended   Chronic diastolic  HF -compensated -Educated about importance of following low sodium diet and to check weight on daily basis -will continue antihypertensive regimen  -echo with preserved EF  Dyslipidemia -will continue statins   HTN -continue metoprolol at adjusted dose -follow BP during visit with PCP and adjust regimen as needed base on BP fluctuation  CAD -no CP and normal troponin   -will continue ASA, metoprolol and statins   Nausea/vomiting and diarrhea -no further vomiting or diarrhea appreciated -will discharge on PRN zofran for further nausea -patient advise to keep himself hydrated   Headache -essentially resolved -LP neg for infectious process and not acute abnormalities seen on CT scan   Blood-tinged sputum  -one isolated episode after dry cough spells -neg V/Q scan for PE -CT chest w/o contrast r/o hemorrhage, infiltrates or acute lung process.    Procedures:  See below for x-ray reports  LP done in ED (no abnormalities or complications seen)  Consultations:  Hematology   Discharge Exam: Vitals:   07/16/17 1258 07/17/17 0500  BP: 140/61 (!) 131/50  Pulse: 64 (!) 57  Resp: 16 16  Temp: 98.2 F (36.8 C) 98.3 F (36.8 C)  SpO2: 98% 98%    General: afebrile, no CP, no further hemoptysis episodes. Denies abd pain, diarrhea and vomiting. Still with some nausea and reporting intermittent HA's (even significantly improved). Cardiovascular: S1 and S2, no rubs, no gallops  Respiratory: good air movement, no wheezing, no crackles Abd: soft, NT, ND, positive BS Extremities: no edema, no cyanosis   Discharge Instructions   Discharge Instructions    Diet - low sodium heart healthy    Complete by:  As directed  Discharge instructions    Complete by:  As directed    Keep yourself well hydrated  Take medications as prescribed  Arrange follow up with PCP in 10 days Follow heart healthy diet     Current Discharge Medication List    START taking these medications    Details  acetaminophen (TYLENOL) 325 MG tablet Take 2 tablets (650 mg total) by mouth every 6 (six) hours as needed for mild pain or headache (or Fever >/= 101). Qty: 45 tablet, Refills: 0    ondansetron (ZOFRAN ODT) 8 MG disintegrating tablet Take 1 tablet (8 mg total) by mouth every 8 (eight) hours as needed for nausea or vomiting. Qty: 30 tablet, Refills: 0      CONTINUE these medications which have CHANGED   Details  metoprolol tartrate (LOPRESSOR) 25 MG tablet Take 0.5 tablets (12.5 mg total) by mouth 2 (two) times daily.      CONTINUE these medications which have NOT CHANGED   Details  atorvastatin (LIPITOR) 40 MG tablet TAKE 1 TABLET BY MOUTH EVERY DAY AT 6 PM Qty: 30 tablet, Refills: 9    esomeprazole (NEXIUM) 40 MG capsule TAKE ONE CAPSULE BY MOUTH EVERY DAY BEFORE BREAKFAST Qty: 30 capsule, Refills: 10    nitroGLYCERIN (NITROSTAT) 0.4 MG SL tablet TAKE 1 TABLET UNDER THE TONGUE, EVERY 5 MINUETS FOR 3 DOSES, AS NEEDED FOR CHEST PAIN AS DIRECTED Qty: 25 tablet, Refills: 1    VOLTAREN 1 % GEL Apply 1 application topically 4 (four) times daily as needed (for pain).  Refills: 5    aspirin EC 81 MG tablet Take 1 tablet (81 mg total) by mouth daily. Qty: 90 tablet, Refills: 3      STOP taking these medications     naproxen sodium (ANAPROX) 220 MG tablet      phenylephrine (SUDAFED PE) 10 MG TABS tablet      tiZANidine (ZANAFLEX) 4 MG tablet      isosorbide mononitrate (IMDUR) 60 MG 24 hr tablet      ZETIA 10 MG tablet        No Known Allergies Follow-up Information    Fleet Contras, MD. Schedule an appointment as soon as possible for a visit in 10 day(s).   Specialty:  Internal Medicine Contact information: [address] [place] [address] [phone]            The results of significant diagnostics from this hospitalization (including imaging, microbiology, ancillary and laboratory) are listed below for reference.    Significant  Diagnostic Studies: Dg Chest 2 View  Result Date: 07/14/2017 CLINICAL DATA:  Cough and fever EXAM: CHEST  2 VIEW COMPARISON:  03/11/2016, 01/01/2012 FINDINGS: No focal consolidation or effusion. Mild to moderate cardiomegaly. Aortic atherosclerosis. No pneumothorax. Degenerative changes of the spine. Surgical clips in the upper abdomen. IMPRESSION: Cardiomegaly without edema or focal pulmonary infiltrate. Electronically Signed   By: Jasmine Pang M.D.   On: 07/14/2017 22:24   Ct Head Wo Contrast  Result Date: 07/14/2017 CLINICAL DATA:  Altered mental status EXAM: CT HEAD WITHOUT CONTRAST TECHNIQUE: Contiguous axial images were obtained from the base of the skull through the vertex without intravenous contrast. COMPARISON:  None. FINDINGS: Brain: No mass lesion, intraparenchymal hemorrhage or extra-axial collection. No evidence of acute cortical infarct. Brain parenchyma and CSF-containing spaces are normal for age. Small calcification at the superior left frontal convexity could be a small meningioma. No abnormality of the underlying brain. Incidentally noted partially empty sella. Vascular: No hyperdense vessel  or unexpected calcification. Skull: Normal visualized skull base, calvarium and extracranial soft tissues. Sinuses/Orbits: No sinus fluid levels or advanced mucosal thickening. No mastoid effusion. Normal orbits. IMPRESSION: 1. No acute intracranial abnormality. Normal appearance of the brain for age. 2. Extra-axial, dome-shaped calcification along the left frontal convexity is likely a small meningioma. No abnormality of the underlying brain. Electronically Signed   By: Deatra Robinson M.D.   On: 07/14/2017 22:49   Ct Chest Wo Contrast  Result Date: 07/16/2017 CLINICAL DATA:  hemoptysis. EXAM: CT CHEST WITHOUT CONTRAST TECHNIQUE: Multidetector CT imaging of the chest was performed following the standard protocol without IV contrast. COMPARISON:  07/14/2017 chest CT. FINDINGS: Cardiovascular:  Mild motion degradation throughout. Tortuous thoracic aorta. Aortic and branch vessel atherosclerosis. Moderate cardiomegaly. Multivessel coronary artery atherosclerosis. Mediastinum/Nodes: No mediastinal or definite hilar adenopathy, given limitations of unenhanced CT. Lungs/Pleura: Trace right-sided pleural fluid or thickening. Scattered linear opacities are likely areas of subsegmental atelectasis and possible scarring. No consolidation or dominant lung lesion. Upper Abdomen: Moderate hepatic steatosis. Hepatic dome 9 mm low-density lesion is favored to represent a cyst. Cholecystectomy. Normal imaged portions of the spleen, stomach, pancreas, adrenal glands, kidneys. Abdominal aortic atherosclerosis. Motion degradation in the upper abdomen as well. Musculoskeletal: No acute osseous abnormality. IMPRESSION: 1. Mild motion degradation. 2. Given this factor, no explanation for or hemoptysis. 3. Coronary artery atherosclerosis. Aortic Atherosclerosis (ICD10-I70.0). 4. Hepatic steatosis. Electronically Signed   By: Jeronimo Greaves M.D.   On: 07/16/2017 19:41   US Abdomen Complete  Result Date: 07/15/2017 CLINICAL DATA:  Thrombocytopenia EXAM: ABDOMEN ULTRASOUND COMPLETE COMPARISON:  05/27/2011 FINDINGS: Gallbladder: Prior cholecystectomy Common bile duct: Diameter: Prominent at 8 mm, compatible with patient's age and post cholecystectomy state. Liver: Increased echotexture compatible with fatty infiltration. No focal abnormality or biliary ductal dilatation. Portal vein is patent on color Doppler imaging with normal direction of blood flow towards the liver. IVC: No abnormality visualized. Pancreas: Visualized portion unremarkable. Spleen: Size and appearance within normal limits. Right Kidney: Length: 10.8 cm. Echogenicity within normal limits. No mass or hydronephrosis visualized. Left Kidney: Length: 10.9 cm. Echogenicity within normal limits. No mass or hydronephrosis visualized. Abdominal aorta: No aneurysm  visualized. Other findings: None. IMPRESSION: Fatty infiltration of the liver. Prior cholecystectomy. No acute findings. Electronically Signed   By: Charlett Nose M.D.   On: 07/15/2017 08:09   Nm Pulmonary Perf And Vent  Result Date: 07/15/2017 CLINICAL DATA:  Nausea, vomiting, fever, chills, headache, hemoptysis mild-to-moderate, recent travel EXAM: NUCLEAR MEDICINE VENTILATION - PERFUSION LUNG SCAN TECHNIQUE: Ventilation images were obtained in multiple projections using inhaled aerosol Tc-66m DTPA. Perfusion images were obtained in multiple projections after intravenous injection of Tc-70m MAA. RADIOPHARMACEUTICALS:  33 mCi Technetium-53m DTPA aerosol inhalation and 4.4 mCi Technetium-33m MAA IV COMPARISON:  None Radiographic correlation:  Chest radiograph 07/14/2017 FINDINGS: Ventilation: Central airway deposition of aerosol. Cardiomegaly. No definite ventilation defects. Perfusion: Cardiomegaly.  No definite perfusion defects. Chest radiograph:  Enlargement of cardiac silhouette IMPRESSION: Enlargement of cardiac silhouette. Otherwise normal ventilation and perfusion lung scan. Electronically Signed   By: Ulyses Southward M.D.   On: 07/15/2017 11:46    Microbiology: Recent Results (from the past 240 hour(s))  Blood culture (routine x 2)     Status: None (Preliminary result)   Collection Time: 07/14/17 10:07 PM  Result Value Ref Range Status   Specimen Description BLOOD RIGHT ANTECUBITAL  Final   Special Requests   Final    BOTTLES DRAWN AEROBIC AND ANAEROBIC Blood Culture adequate  volume   Culture  Setup Time   Final    GRAM POSITIVE COCCI IN CLUSTERS AEROBIC BOTTLE ONLY CRITICAL RESULT CALLED TO, READ BACK BY AND VERIFIED WITH: PHARMD N GLOGOVAC [id number] MLM    Culture   Final    GRAM POSITIVE COCCI IDENTIFICATION TO FOLLOW Performed at [facility], [address], [place], [place] [address]    Report Status PENDING  Incomplete  Blood Culture ID Panel (Reflexed)     Status:  Abnormal   Collection Time: 07/14/17 10:07 PM  Result Value Ref Range Status   Enterococcus species NOT DETECTED NOT DETECTED Final   Listeria monocytogenes NOT DETECTED NOT DETECTED Final   Staphylococcus species DETECTED (A) NOT DETECTED Final    Comment: Methicillin (oxacillin) resistant coagulase negative staphylococcus. Possible blood culture contaminant (unless isolated from more than one blood culture draw or clinical case suggests pathogenicity). No antibiotic treatment is indicated for blood  culture contaminants. CRITICAL RESULT CALLED TO, READ BACK BY AND VERIFIED WITH: PHARMD N GLOGOVAC [id number] MLM    Staphylococcus aureus NOT DETECTED NOT DETECTED Final   Methicillin resistance DETECTED (A) NOT DETECTED Final    Comment: CRITICAL RESULT CALLED TO, READ BACK BY AND VERIFIED WITH: PHARMD N GLOGOVAC [id number] MLM    Streptococcus species NOT DETECTED NOT DETECTED Final   Streptococcus agalactiae NOT DETECTED NOT DETECTED Final   Streptococcus pneumoniae NOT DETECTED NOT DETECTED Final   Streptococcus pyogenes NOT DETECTED NOT DETECTED Final   Acinetobacter baumannii NOT DETECTED NOT DETECTED Final   Enterobacteriaceae species NOT DETECTED NOT DETECTED Final   Enterobacter cloacae complex NOT DETECTED NOT DETECTED Final   Escherichia coli NOT DETECTED NOT DETECTED Final   Klebsiella oxytoca NOT DETECTED NOT DETECTED Final   Klebsiella pneumoniae NOT DETECTED NOT DETECTED Final   Proteus species NOT DETECTED NOT DETECTED Final   Serratia marcescens NOT DETECTED NOT DETECTED Final   Haemophilus influenzae NOT DETECTED NOT DETECTED Final   Neisseria meningitidis NOT DETECTED NOT DETECTED Final   Pseudomonas aeruginosa NOT DETECTED NOT DETECTED Final   Candida albicans NOT DETECTED NOT DETECTED Final   Candida glabrata NOT DETECTED NOT DETECTED Final   Candida krusei NOT DETECTED NOT DETECTED Final   Candida parapsilosis NOT DETECTED NOT DETECTED Final   Candida  tropicalis NOT DETECTED NOT DETECTED Final    Comment: Performed at [facility], [address] [place], [place] [address]  Blood culture (routine x 2)     Status: None (Preliminary result)   Collection Time: 07/14/17 10:12 PM  Result Value Ref Range Status   Specimen Description BLOOD LEFT FOREARM  Final   Special Requests   Final    BOTTLES DRAWN AEROBIC AND ANAEROBIC Blood Culture adequate volume   Culture   Final    NO GROWTH 1 DAY Performed at [facility], [address] [place], [place] [address]    Report Status PENDING  Incomplete  Respiratory Panel by PCR     Status: None   Collection Time: 07/14/17 11:49 PM  Result Value Ref Range Status   Adenovirus NOT DETECTED NOT DETECTED Final   Coronavirus 229E NOT DETECTED NOT DETECTED Final   Coronavirus HKU1 NOT DETECTED NOT DETECTED Final   Coronavirus NL63 NOT DETECTED NOT DETECTED Final   Coronavirus OC43 NOT DETECTED NOT DETECTED Final   Metapneumovirus NOT DETECTED NOT DETECTED Final   Rhinovirus / Enterovirus NOT DETECTED NOT DETECTED Final   Influenza A NOT  DETECTED NOT DETECTED Final   Influenza B NOT DETECTED NOT DETECTED Final   Parainfluenza Virus 1 NOT DETECTED NOT DETECTED Final   Parainfluenza Virus 2 NOT DETECTED NOT DETECTED Final   Parainfluenza Virus 3 NOT DETECTED NOT DETECTED Final   Parainfluenza Virus 4 NOT DETECTED NOT DETECTED Final   Respiratory Syncytial Virus NOT DETECTED NOT DETECTED Final   Bordetella pertussis NOT DETECTED NOT DETECTED Final   Chlamydophila pneumoniae NOT DETECTED NOT DETECTED Final   Mycoplasma pneumoniae NOT DETECTED NOT DETECTED Final    Comment: Performed at [facility], [address] [place], [place] [address]  Urine Culture     Status: None   Collection Time: 07/15/17 12:25 AM  Result Value Ref Range Status   Specimen Description URINE, CLEAN CATCH  Final   Special Requests NONE  Final   Culture   Final    NO GROWTH Performed at [facility], [address] [place], [place] [address]    Report Status 07/16/2017 FINAL  Final  CSF culture     Status: None (Preliminary result)   Collection Time: 07/15/17 12:51 AM  Result Value Ref Range Status   Specimen Description BACK  Final   Special Requests Normal  Final   Gram Stain   Final    NO ORGANISMS SEEN NO WBC SEEN CYTOSPIN SMEAR Gram Stain Report Called to,Read Back By and Verified With: Nathaniel ManJOHN FRICKEY,RN [phone] @ 0300 BY J SCOTTON    Culture   Final    NO GROWTH 1 DAY Performed at [facility], [address] [place], [place] [address]    Report Status PENDING  Incomplete     Labs: Basic Metabolic Panel:  Recent Labs Lab 07/14/17 1739 07/15/17 0456 07/16/17 0506 07/17/17 0539  NA 141 144 139 139  K 4.0 3.5 3.5 4.1  CL 109 115* 106 108  CO2 22 22 24 24   GLUCOSE 111* 94 113* 93  BUN 20 15 8 9   CREATININE 0.56 0.56 0.45 0.51  CALCIUM 8.6* 7.4* 8.1* 8.6*  MG  --  1.6*  --   --   PHOS  --  3.1  --   --    Liver Function Tests:  Recent Labs Lab 07/14/17 1739 07/15/17 0456  AST 78* 83*  ALT 51 51  ALKPHOS 63 48  BILITOT 0.5 0.5  PROT 7.0 5.5*  ALBUMIN 3.7 2.8*    Recent Labs Lab 07/14/17 1739  LIPASE 43   No results for input(s): AMMONIA in the last 168 hours. CBC:  Recent Labs Lab 07/14/17 1739 07/15/17 0011 07/15/17 0547 07/16/17 0506 07/17/17 0539  WBC 3.4*  --  2.8* 3.7* 5.2  NEUTROABS 2.0  --   --  1.2* 1.9  HGB 11.1*  --  9.7* 11.1* 10.4*  HCT 34.1*  --  30.1* 33.6* 31.9*  MCV 64.3*  --  64.5* 64.4* 64.3*  PLT 94* 68* 61* 98* 128*   Cardiac Enzymes:  Recent Labs Lab 07/15/17 0011  CKTOTAL 57    Signed:  Vassie LollMadera, Annali Lybrand MD.  [facility] 07/17/2017, 10:13 AM

## 2017-07-18 LAB — CULTURE, BLOOD (ROUTINE X 2): SPECIAL REQUESTS: ADEQUATE

## 2017-07-18 LAB — CSF CULTURE
GRAM STAIN: NONE SEEN
SPECIAL REQUESTS: NORMAL

## 2017-07-18 LAB — CSF CULTURE W GRAM STAIN: Culture: NO GROWTH

## 2017-07-19 LAB — PARASITE EXAM, BLOOD

## 2017-07-20 LAB — CULTURE, BLOOD (ROUTINE X 2)
Culture: NO GROWTH
Special Requests: ADEQUATE

## 2017-11-08 ENCOUNTER — Encounter: Payer: Self-pay | Admitting: Cardiology

## 2017-11-08 ENCOUNTER — Ambulatory Visit: Payer: Medicare Other | Admitting: Cardiology

## 2017-11-08 ENCOUNTER — Ambulatory Visit (INDEPENDENT_AMBULATORY_CARE_PROVIDER_SITE_OTHER): Payer: Medicare Other | Admitting: Cardiology

## 2017-11-08 VITALS — BP 128/70 | Ht 59.0 in | Wt 133.0 lb

## 2017-11-08 DIAGNOSIS — I5032 Chronic diastolic (congestive) heart failure: Secondary | ICD-10-CM | POA: Diagnosis not present

## 2017-11-08 DIAGNOSIS — Z9861 Coronary angioplasty status: Secondary | ICD-10-CM | POA: Diagnosis not present

## 2017-11-08 DIAGNOSIS — I2510 Atherosclerotic heart disease of native coronary artery without angina pectoris: Secondary | ICD-10-CM

## 2017-11-08 DIAGNOSIS — E785 Hyperlipidemia, unspecified: Secondary | ICD-10-CM

## 2017-11-08 DIAGNOSIS — R030 Elevated blood-pressure reading, without diagnosis of hypertension: Secondary | ICD-10-CM

## 2017-11-08 DIAGNOSIS — I214 Non-ST elevation (NSTEMI) myocardial infarction: Secondary | ICD-10-CM

## 2017-11-08 NOTE — Patient Instructions (Signed)
No change in medications    Labs - need to come to office - when you have not had eat or drink   cmp  lipid aic   Your physician wants you to follow-up in 12 months with DR HARDING.You will receive a reminder letter in the mail two months in advance. If you don't receive a letter, please call our office to schedule the follow-up appointment.

## 2017-11-08 NOTE — Progress Notes (Signed)
PCP: Fleet ContrasAvbuere, Edwin, MD - but does not go to see any more.   Clinic Note: Chief Complaint  Patient presents with  . Follow-up    No major complaint  . Coronary Artery Disease    History of LAD PCI in setting of non-STEMI.    HPI: Makynzee T Roediger is a 72 y.o. female with a PMH below who presents today for annual follow-up for CAD-PCI.Marland Kitchen. Her anginal symptom was substernal chest pressure with nausea and dyspnea etc.   Zeah T Dusenbery was last seen in February 2018.  She was doing relatively well.  Excited about upcoming trip to [place]..  Recent Hospitalizations:   October 17-20, 2018: After returning from [place], she presented with fatigue, headache, nausea vomiting and fever.  Noted to have pancytopenia.  Symptoms are consistent with Systemic Inflammatory Response Syndrome (SIRS).  She was treated for brief period with antibiotics, but cultures grew out negative.  This was thought to be related to a viral illness.  2D echo ordered was normal.  Studies Reviewed:   Myoview 02/2016: LOW RISK study. No ischemia or infarction. EF 60-65%.  2D Echo October 2018: EF 55-60%.  Normal wall motion.  History taken with the assistance of the Video-assistant [place] interpreter.  Interval History: She presents today =- relatively stable.  Despite the fact that her H&P and discharge summary from October indicate that she had gone to [place] over the summer, she tells me she did not go because of the money concerns.  Unfortunately today's visit was very difficult using the telephonic interpreter/iPad).  I was really unable to carry out a decent interview.  Many of her symptoms are somewhat vague and difficult to ascertain.  From what I can gather, she really has been stable since I last saw her.  She does have exertional symptoms if she tries to rush, but walks about a hour a day without significant symptoms.  If she pushes it or goes fast/rushes she will noticed some shortness of breath and tightness in  her chest that is relieved with rest. She still has her focal tenderness throughout the upper chest on the sternal border that is present.  All in all, she is not really noticing a significant anginal symptoms with rest or exertion.  No PND, orthopnea or edema. No sensation of rapid irregular heartbeats or palpitations that I can determine by interpreter evaluation. No syncope/near syncope or TIA/amaurosis fugax.  No melena, hematochezia, hematuria.  She seems to have fully resolved her symptoms from back in October.  Only has mild occasional cough.  For the most part seems like she is staying active and not having any active cardiac symptoms unless she overdoes it.  None of the symptoms that were consistent with her MI.   ROS: A comprehensive was attempted, but I honestly could not get through the entire review.. Review of Systems  Constitutional: Negative for malaise/fatigue.  HENT: Negative for congestion and nosebleeds.   Respiratory: Negative for cough (Off and on since her illness in October).   Cardiovascular: Negative.        Per history of present illness  Gastrointestinal: Negative for abdominal pain, blood in stool, constipation, heartburn and melena.  Genitourinary: Negative for hematuria.  Musculoskeletal: Negative.  Negative for joint pain and myalgias.  Neurological: Negative.  Negative for dizziness.  Endo/Heme/Allergies: Bruises/bleeds easily.  Psychiatric/Behavioral: Negative for memory loss. The patient is nervous/anxious. The patient does not have insomnia.   All other systems reviewed and are negative.  Past Medical History:  Diagnosis Date  . Anxiety   . Borderline hypertension   . CAD S/P percutaneous coronary angioplasty 12/31/2011   3 sequential LAD lesions treated with a single Promus Element DES; - Myoview 02/2016: No infarct or ischemia. EF 60-65%.  . Costochondritis   . Dyslipidemia, goal LDL below 70   . H/O echocardiogram April 2014   Normal EF:  55-60%, no regional WMA, grade 1 diastolic dysfunction. Mild MR  . History of colon cancer  2010  . History of: Non-ST elevation MI (NSTEMI) 12/31/2011   Severe LAD lesions: Sequential 90%, 70% and 60%; no other significant stenoses. She had had a normal Myoview in 2012 AS WELL AS POST-PCI IN 2017  . Osteoarthritis   . Presence of drug coated stent in LAD coronary artery 01/01/2012   Promus Element DES 2.75 mm x38 mm postdilated to 3.0 mm    Past Surgical History:  Procedure Laterality Date  . CHOLECYSTECTOMY  2004  . LEFT HEART CATHETERIZATION WITH CORONARY ANGIOGRAM N/A 01/01/2012   Procedure: LEFT HEART CATHETERIZATION WITH CORONARY ANGIOGRAM;  Surgeon: Lennette Bihari, MD;  Location[facility] LAB;  Service: Cardiovascular;  Laterality: N/A;  . NM MYOVIEW LTD  September 2014   No ischemia or infarction. EF 70%  . NM MYOVIEW LTD  02/2016    LOW RISK study. No ischemia or infarction. EF 60-65%.  Marland Kitchen PERCUTANEOUS CORONARY STENT INTERVENTION (PCI-S)  01/01/2012   Procedure: PERCUTANEOUS CORONARY STENT INTERVENTION (PCI-S);  Surgeon: Lennette Bihari, MD;  Location: [facility] CATH LAB;  Service: Cardiovascula: LAD PCI: Promus Element DES 2.75 MM by 30 MM, postdilated to 3.0 MM  . TRANSTHORACIC ECHOCARDIOGRAM  April 2014   EF 55%. Grade 1 diastolic dysfunction. Mild MR    Current Meds  Medication Sig  . acetaminophen (TYLENOL) 325 MG tablet Take 2 tablets (650 mg total) by mouth every 6 (six) hours as needed for mild pain or headache (or Fever >/= 101).  Marland Kitchen aspirin EC 81 MG tablet Take 1 tablet (81 mg total) by mouth daily.  Marland Kitchen atorvastatin (LIPITOR) 40 MG tablet TAKE 1 TABLET BY MOUTH EVERY DAY AT 6 PM (Patient taking differently: TAKE 1 TABLET BY MOUTH EVERY DAY)  . esomeprazole (NEXIUM) 40 MG capsule TAKE ONE CAPSULE BY MOUTH EVERY DAY BEFORE BREAKFAST  . metoprolol tartrate (LOPRESSOR) 25 MG tablet Take 0.5 tablets (12.5 mg total) by mouth 2 (two) times daily.  . nitroGLYCERIN (NITROSTAT) 0.4 MG SL tablet  TAKE 1 TABLET UNDER THE TONGUE, EVERY 5 MINUETS FOR 3 DOSES, AS NEEDED FOR CHEST PAIN AS DIRECTED  . ondansetron (ZOFRAN ODT) 8 MG disintegrating tablet Take 1 tablet (8 mg total) by mouth every 8 (eight) hours as needed for nausea or vomiting.  Marland Kitchen VOLTAREN 1 % GEL Apply 1 application topically 4 (four) times daily as needed (for pain).     No Known Allergies  Social History   Socioeconomic History  . Marital status: Married    Spouse name: None  . Number of children: None  . Years of education: None  . Highest education level: None  Social Needs  . Financial resource strain: None  . Food insecurity - worry: None  . Food insecurity - inability: None  . Transportation needs - medical: None  . Transportation needs - non-medical: None  Occupational History  . None  Tobacco Use  . Smoking status: Never Smoker  . Smokeless tobacco: Never Used  Substance and Sexual Activity  . Alcohol use:  No  . Drug use: No  . Sexual activity: No    Birth control/protection: Post-menopausal  Other Topics Concern  . None  Social History Narrative   She is a native [place] woman. She is married with 5 children and 7 grandchildren. One of her daughters or her granddaughters usually comes as an Equities trader. She does not speak any [place].   The granddaughter with her today notes that the patient is significantly prone to anxiety and stress related to family issues. Most of her symptoms are related to stressful situations in house.   She does not smoke or drink. She does not routinely exercise.    family history includes Coronary artery disease in her mother.  Wt Readings from Last 3 Encounters:  11/08/17 133 lb (60.3 kg)  07/15/17 136 lb 3.9 oz (61.8 kg)  11/06/16 122 lb 6.4 oz (55.5 kg)    PHYSICAL EXAM BP 128/70 (BP Location: Left Arm, Patient Position: Sitting, Cuff Size: Normal)   Ht 4\' 11"  (1.499 m)   Wt 133 lb (60.3 kg)   BMI 26.86 kg/m  Heart rate was 70 Physical Exam    Constitutional: She is oriented to person, place, and time. She appears well-developed and well-nourished. No distress.  HENT:  Head: Normocephalic and atraumatic.  Poor dentition  Neck: No hepatojugular reflux and no JVD present. Carotid bruit is not present.  Cardiovascular: Normal rate, regular rhythm and intact distal pulses.  No extrasystoles are present. PMI is not displaced. Exam reveals no gallop and no friction rub.  Murmur heard.  Medium-pitched harsh crescendo-decrescendo early systolic murmur is present with a grade of 1/6 at the upper right sternal border radiating to the neck. Pulmonary/Chest: Effort normal and breath sounds normal. No respiratory distress. She has no wheezes.  Abdominal: Soft. Bowel sounds are normal. She exhibits no distension. There is no tenderness. There is no rebound.  Musculoskeletal: Normal range of motion. She exhibits no edema.  Neurological: She is alert and oriented to person, place, and time.  Skin: Skin is warm and dry.  Psychiatric: She has a normal mood and affect. Her behavior is normal.  Nursing note and vitals reviewed.  General appearance: alert, cooperative, appears stated age, no distress; the last [place]. She does seem to answer her granddaughter's questions directly. Well-nourished well-groomed. HEENT: [place]/AT, EOMI, MMM, anicteric sclera Neck: Supple, no adenopathy, no carotid bruit and no JVD Lungs: clear to auscultation bilaterally, normal percussion bilaterally and non-labored Heart: regular rate and rhythm, S1, S2 normal, no click, rub or gallop; 1/6 SEM crescendo-decrescendo @ RUSB  Abdomen: soft, non-tender; bowel sounds normal; no masses, no organomegaly; Extremities: extremities normal, atraumatic, no cyanosis, OR edema Pulses: 2+ and symmetric; Neurologic: Mental status: Alert, oriented, thought content appropriate; there Cranial nerves: normal (II-XII grossly intact) Psych: normal, pleasant mood & affect   Adult ECG  Report  Rate: 68 ;  Rhythm: normal sinus rhythm and non-specific ST-T wave changes.  Otherwise normal axis, intervals & durations;   Narrative Interpretation: stable EKG   Other studies Reviewed: Additional studies/ records that were reviewed today include:  Recent Labs:   Lab Results  Component Value Date   CHOL 123 01/24/2015   HDL 38 (L) 01/24/2015   LDLCALC 59 01/24/2015   TRIG 131 01/24/2015   CHOLHDL 3.2 01/24/2015  Followed by PCP - on statin.  But stopped seeing PCP.    ASSESSMENT / PLAN: Problem List Items Addressed This Visit    Borderline hypertension (Chronic)    Stable  blood pressure today on low-dose beta-blocker.      CAD S/P percutaneous coronary angioplasty - DES to LAD April 2013 - Primary (Chronic)    She seems to be doing relatively stable.  She may very well have some symptoms with significant exertion, but not otherwise.  Has had a negative Myoview since her PCI.  Most difficult part of interview with her is to fully understand her symptoms.  At this point she insinuates that she is relatively stable and not had any symptoms.  She has not had any concerning episodes. Is now simply on aspirin, statin and beta-blocker. We may have to intermittently check stress test to fully understand her symptoms.  But at this point she seems relatively stable.  Not requiring sublingual nitroglycerin. No longer on Imdur.      Relevant Orders   Lipid panel   Comprehensive metabolic panel   Chronic diastolic CHF (congestive heart failure) (HCC) (Chronic)    No active heart failure symptoms.  This may be part of her exertional dyspnea that she sometimes has, but for now seems to be stable with relatively normal blood pressure.  Euvolemic on exam.  If pressures were to increase, would probably need to consider ARB.      Dyslipidemia, goal LDL below 70 (Chronic)    Unfortunately, I thought this is being followed by PCP.  However she now indicates that she is not seeing a PCP.   She understands that her cardiologist is her family doctor.  Obviously without the interpreter I cannot fully explain this to her, but since she is not seeing a PCP, will order baseline screening labs including lipid panel and chemistry panel along with A1c. Continue statin.      Relevant Orders   Lipid panel   Comprehensive metabolic panel   NSTEMI, April 2013 (Chronic)    This episode was somewhat out of the blue.  Prior to seeing her on that occasion, she had been having intermittent chest discomfort off and on for a long time but has had negative ischemic evaluations.  She then was found to have a non-STEMI and significant LAD disease treated with a DES stent. As such, is been very difficult to fully understand her symptoms.  She has had a negative Myoview in 2017 following her PCI.  Since we now know her anatomy, that is somewhat reassuring that there was no more ischemic lesion present.  Based on the vague nature of her symptoms, we will probably end up checking another stress test sometime in the near future.  Thankfully so far her EF is preserved with no significant wall motion abnormalities.  On beta-blocker and statin.         Somewhat difficult to interview.  Simply because the use of interpreter, more than 25 minutes was required due to get through the entire process.  Greater than 50% of the time was spent in direct counseling and discussion of symptoms.  Current medicines are reviewed at length with the patient today. (+/- concerns) n/a The following changes have been made: n/a  Patient Instructions  No change in medications    Labs - need to come to office - when you have not had eat or drink   cmp  lipid aic   Your physician wants you to follow-up in 12 months with DR Antavia Tandy.You will receive a reminder letter in the mail two months in advance. If you don't receive a letter, please call our office to schedule the follow-up appointment.  Studies Ordered:    Orders Placed This Encounter  Procedures  . Lipid panel  . Comprehensive metabolic panel      Bryan Lemma, M.D., M.S. Interventional Cardiologist   Pager # [phone] Phone # [phone] [address] [place], [place] [address]

## 2017-11-09 ENCOUNTER — Encounter: Payer: Self-pay | Admitting: Cardiology

## 2017-11-09 LAB — LIPID PANEL
CHOL/HDL RATIO: 3.3 ratio (ref 0.0–4.4)
Cholesterol, Total: 120 mg/dL (ref 100–199)
HDL: 36 mg/dL — ABNORMAL LOW (ref >39)
LDL Calculated: 60 mg/dL (ref 0–99)
TRIGLYCERIDES: 118 mg/dL (ref 0–149)
VLDL Cholesterol Cal: 24 mg/dL (ref 5–40)

## 2017-11-09 LAB — COMPREHENSIVE METABOLIC PANEL
A/G RATIO: 1.5 (ref 1.2–2.2)
ALT: 32 IU/L (ref 0–32)
AST: 35 IU/L (ref 0–40)
Albumin: 4.1 g/dL (ref 3.5–4.8)
Alkaline Phosphatase: 71 IU/L (ref 39–117)
BUN/Creatinine Ratio: 25 (ref 12–28)
BUN: 12 mg/dL (ref 8–27)
Bilirubin Total: 0.3 mg/dL (ref 0.0–1.2)
CALCIUM: 8.9 mg/dL (ref 8.7–10.3)
CHLORIDE: 108 mmol/L — AB (ref 96–106)
CO2: 25 mmol/L (ref 20–29)
Creatinine, Ser: 0.48 mg/dL — ABNORMAL LOW (ref 0.57–1.00)
GFR calc Af Amer: 113 mL/min/{1.73_m2} (ref >59)
GFR, EST NON AFRICAN AMERICAN: 98 mL/min/{1.73_m2} (ref >59)
Globulin, Total: 2.7 g/dL (ref 1.5–4.5)
Glucose: 101 mg/dL — ABNORMAL HIGH (ref 65–99)
POTASSIUM: 3.9 mmol/L (ref 3.5–5.2)
Sodium: 147 mmol/L — ABNORMAL HIGH (ref 134–144)
Total Protein: 6.8 g/dL (ref 6.0–8.5)

## 2017-11-09 NOTE — Assessment & Plan Note (Signed)
Unfortunately, I thought this is being followed by PCP.  However she now indicates that she is not seeing a PCP.  She understands that her cardiologist is her family doctor.  Obviously without the interpreter I cannot fully explain this to her, but since she is not seeing a PCP, will order baseline screening labs including lipid panel and chemistry panel along with A1c. Continue statin.

## 2017-11-09 NOTE — Assessment & Plan Note (Signed)
She seems to be doing relatively stable.  She may very well have some symptoms with significant exertion, but not otherwise.  Has had a negative Myoview since her PCI.  Most difficult part of interview with her is to fully understand her symptoms.  At this point she insinuates that she is relatively stable and not had any symptoms.  She has not had any concerning episodes. Is now simply on aspirin, statin and beta-blocker. We may have to intermittently check stress test to fully understand her symptoms.  But at this point she seems relatively stable.  Not requiring sublingual nitroglycerin. No longer on Imdur.

## 2017-11-09 NOTE — Assessment & Plan Note (Signed)
No active heart failure symptoms.  This may be part of her exertional dyspnea that she sometimes has, but for now seems to be stable with relatively normal blood pressure.  Euvolemic on exam.  If pressures were to increase, would probably need to consider ARB.

## 2017-11-09 NOTE — Assessment & Plan Note (Signed)
This episode was somewhat out of the blue.  Prior to seeing her on that occasion, she had been having intermittent chest discomfort off and on for a long time but has had negative ischemic evaluations.  She then was found to have a non-STEMI and significant LAD disease treated with a DES stent. As such, is been very difficult to fully understand her symptoms.  She has had a negative Myoview in 2017 following her PCI.  Since we now know her anatomy, that is somewhat reassuring that there was no more ischemic lesion present.  Based on the vague nature of her symptoms, we will probably end up checking another stress test sometime in the near future.  Thankfully so far her EF is preserved with no significant wall motion abnormalities.  On beta-blocker and statin.

## 2017-11-09 NOTE — Assessment & Plan Note (Signed)
Stable blood pressure today on low-dose beta-blocker.

## 2017-11-10 NOTE — Addendum Note (Signed)
Addended by: Roosvelt HarpsHARRIS, Anamarie Hunn R on: 11/10/2017 02:04 PM   Modules accepted: Orders

## 2017-11-12 ENCOUNTER — Encounter: Payer: Self-pay | Admitting: *Deleted

## 2017-12-11 ENCOUNTER — Other Ambulatory Visit: Payer: Self-pay | Admitting: Cardiology

## 2017-12-13 NOTE — Telephone Encounter (Signed)
REFILL 

## 2018-02-09 ENCOUNTER — Other Ambulatory Visit: Payer: Self-pay | Admitting: *Deleted

## 2018-02-09 MED ORDER — ATORVASTATIN CALCIUM 40 MG PO TABS: 40.0000 mg | ORAL_TABLET | Freq: Every day | ORAL | 3 refills | Status: DC

## 2018-02-16 ENCOUNTER — Other Ambulatory Visit: Payer: Self-pay | Admitting: Cardiology

## 2018-04-16 ENCOUNTER — Other Ambulatory Visit: Payer: Self-pay | Admitting: Cardiology

## 2018-06-27 ENCOUNTER — Telehealth: Payer: Self-pay | Admitting: Cardiology

## 2018-06-27 ENCOUNTER — Other Ambulatory Visit: Payer: Self-pay | Admitting: Cardiology

## 2018-06-27 MED ORDER — METOPROLOL TARTRATE 25 MG PO TABS: 12.5000 mg | ORAL_TABLET | Freq: Two times a day (BID) | ORAL | 8 refills | Status: DC

## 2018-06-27 MED ORDER — ESOMEPRAZOLE MAGNESIUM 40 MG PO CPDR: DELAYED_RELEASE_CAPSULE | ORAL | 8 refills | Status: DC

## 2018-06-27 NOTE — Telephone Encounter (Signed)
New Message:     *STAT* If patient is at the pharmacy, call can be transferred to refill team.   1. Which medications need to be refilled? (please list name of each medication and dose if known) esomeprazole (NEXIUM) 40 MG capsule and metoprolol tartrate (LOPRESSOR) 25 MG tablet  2. Which pharmacy/location (including street and city if local pharmacy) is medication to be sent to? [id number] - [place], [place] - [address]  3. Do they need a 30 day or 90 day supply? 30 Days

## 2018-06-27 NOTE — Telephone Encounter (Signed)
Medication refill request already completed today

## 2018-06-27 NOTE — Telephone Encounter (Signed)
New Message         *STAT* If patient is at the pharmacy, call can be transferred to refill team.   1. Which medications need to be refilled? (please list name of each medication and dose if known) esomeprazole (NEXIUM) 40 MG capsule/metoprolol tartrate (LOPRESSOR) 25 MG tablet  2. Which pharmacy/location (including street and city if local pharmacy) is medication to be sent to?[id number] - [place], [place] - [address]  3. Do they need a 30 day or 90 day supply? 30

## 2018-11-28 ENCOUNTER — Telehealth: Payer: Self-pay | Admitting: Cardiology

## 2018-11-28 NOTE — Telephone Encounter (Signed)
LEFT MESSAGE ON STEPHANIE VOICE MAIL -  IF NTG DOES NOT HELP AFTER THE USE OF 1 TABLET EVERY 5 NINS UPT TO 3 TABLET TOTAL-- CALL 911 TO GO [place] . ANY QUESTION MAY CALL THE DOCTOR ON CALL

## 2018-11-28 NOTE — Telephone Encounter (Signed)
New Message    Pt c/o of Chest Pain: STAT if CP now or developed within 24 hours  1. Are you having CP right now? Yes  2. Are you experiencing any other symptoms (ex. SOB, nausea, vomiting, sweating)? NO  3. How long have you been experiencing CP? For a couple days and it stops when she uses her nitroglycerin  4. Is your CP continuous or coming and going? Coming and going   5. Have you taken Nitroglycerin? Yes she has it under her tongue right now ?

## 2018-11-29 NOTE — Telephone Encounter (Signed)
Patient's daughter Judeth Cornfield returned call. She is stated she didn't not receive VM, she would like a call back.  She can be reached at [phone].

## 2018-11-29 NOTE — Telephone Encounter (Signed)
Spoke with patients daughter and patient has been having chest pains off and on over the last few days. Denies any shortness of breath or radiating pain. NTG does seem to help. Patient does not want to see NP/PA and wants to wait until April to see Dr Herbie Baltimore. Advised daughter that she should be seen before then, however patients prefers to wait. Advised daughter if chest pain returns without relief or has to take NTG 3 x to call 911. Daughter verbalized understanding.

## 2018-12-06 NOTE — Telephone Encounter (Signed)
Spoke to daughter  Appointment moved up to 12/20/18 at 2:40 pm

## 2018-12-19 ENCOUNTER — Other Ambulatory Visit: Payer: Self-pay

## 2018-12-19 MED ORDER — NITROGLYCERIN 0.4 MG SL SUBL: SUBLINGUAL_TABLET | SUBLINGUAL | 1 refills | Status: DC

## 2018-12-20 ENCOUNTER — Telehealth: Payer: Self-pay | Admitting: *Deleted

## 2018-12-20 ENCOUNTER — Ambulatory Visit: Payer: Medicare Other | Admitting: Cardiology

## 2018-12-20 NOTE — Telephone Encounter (Signed)
OPEN IN ERROR 

## 2019-01-13 ENCOUNTER — Ambulatory Visit: Payer: Medicare Other | Admitting: Cardiology

## 2019-03-27 ENCOUNTER — Encounter: Payer: Self-pay | Admitting: Cardiology

## 2019-03-27 ENCOUNTER — Ambulatory Visit (INDEPENDENT_AMBULATORY_CARE_PROVIDER_SITE_OTHER): Payer: Medicare Other | Admitting: Cardiology

## 2019-03-27 ENCOUNTER — Other Ambulatory Visit: Payer: Self-pay

## 2019-03-27 VITALS — BP 120/70 | HR 68 | Ht 59.0 in | Wt 132.0 lb

## 2019-03-27 DIAGNOSIS — I2510 Atherosclerotic heart disease of native coronary artery without angina pectoris: Secondary | ICD-10-CM

## 2019-03-27 DIAGNOSIS — R030 Elevated blood-pressure reading, without diagnosis of hypertension: Secondary | ICD-10-CM | POA: Diagnosis not present

## 2019-03-27 DIAGNOSIS — I214 Non-ST elevation (NSTEMI) myocardial infarction: Secondary | ICD-10-CM | POA: Diagnosis not present

## 2019-03-27 DIAGNOSIS — E785 Hyperlipidemia, unspecified: Secondary | ICD-10-CM | POA: Diagnosis not present

## 2019-03-27 DIAGNOSIS — Z9861 Coronary angioplasty status: Secondary | ICD-10-CM | POA: Diagnosis not present

## 2019-03-27 NOTE — Progress Notes (Signed)
PCP: Fleet ContrasAvbuere, Edwin, MD - but does not go to see any more.   Clinic Note: Chief Complaint  Patient presents with  . Follow-up    Doing well  . Coronary Artery Disease    No angina    HPI: Tammy Murillo is a 73 y.o. female with a PMH notable for CAD with non-STEMI-PCI who presents today for delayed annual follow-up. Her anginal symptom was substernal chest pressure with nausea and dyspnea etc.   Shatira T Bucks was last seen in February 2018.  She was doing relatively well.  Excited about upcoming trip to [place]..  Recent Hospitalizations:   None  Studies Reviewed:     History taken with the assistance of the [place] Interpreter - Inetta Fermoina.  Interval History: She presents today very chipper in a good mood.  She seems to be communicating well with this current interpreter.  She said that she is doing great the only problem is that she wants to go out and also do more.  She still does yard work and things around the house as long as her family will let her, but if she is not doing walking when she is because of fear of COVID 19. Really from a cardiac standpoint she noticed maybe feeling a little tired and maybe short of breath if she really rushes to do something, but otherwise no real exertional dyspnea or chest pain/pressure.   Remainder of Cardiovascular ROS: no chest pain or dyspnea on exertion positive for - Some mild fatigue with exertion associated with dyspnea if overdoing it. negative for - edema, irregular heartbeat, loss of consciousness, orthopnea, palpitations, paroxysmal nocturnal dyspnea, rapid heart rate or Near syncope, syncope, TIA/amaurosis fugax.  Claudication.  The patient does not have symptoms concerning for COVID-19 infection (fever, chills, cough, or new shortness of breath).  The patient is practicing social distancing.   COVID-19 Education: The signs and symptoms of COVID-19 were discussed with the patient and how to seek care for testing (follow up with  PCP or arrange E-visit).   The importance of social distancing was discussed today.  ROS: A comprehensive was attempted, but I honestly could not get through the entire review.. Review of Systems  Constitutional: Negative for malaise/fatigue.  HENT: Negative for congestion and nosebleeds.   Respiratory: Negative for cough.   Cardiovascular: Negative.        Per history of present illness  Gastrointestinal: Negative for abdominal pain, blood in stool, constipation, heartburn and melena.  Genitourinary: Negative for hematuria.  Musculoskeletal: Negative.  Negative for joint pain and myalgias.  Neurological: Negative.  Negative for dizziness.  Endo/Heme/Allergies: Bruises/bleeds easily.  Psychiatric/Behavioral: Negative for memory loss. The patient is nervous/anxious. The patient does not have insomnia.   All other systems reviewed and are negative.   Past Medical History:  Diagnosis Date  . Anxiety   . Borderline hypertension   . CAD S/P percutaneous coronary angioplasty 12/31/2011   3 sequential LAD lesions treated with a single Promus Element DES; - Myoview 02/2016: No infarct or ischemia. EF 60-65%.  . Costochondritis   . Dyslipidemia, goal LDL below 70   . H/O echocardiogram April 2014   Normal EF: 55-60%, no regional WMA, grade 1 diastolic dysfunction. Mild MR  . History of colon cancer  2010  . History of: Non-ST elevation MI (NSTEMI) 12/31/2011   Severe LAD lesions: Sequential 90%, 70% and 60%; no other significant stenoses. She had had a normal Myoview in 2012 AS WELL AS POST-PCI  IN 2017  . Osteoarthritis   . Presence of drug coated stent in LAD coronary artery 01/01/2012   Promus Element DES 2.75 mm x38 mm postdilated to 3.0 mm    Past Surgical History:  Procedure Laterality Date  . CHOLECYSTECTOMY  2004  . LEFT HEART CATHETERIZATION WITH CORONARY ANGIOGRAM N/A 01/01/2012   Procedure: LEFT HEART CATHETERIZATION WITH CORONARY ANGIOGRAM;  Surgeon: Troy Sine, MD;   Location: [facility] CATH LAB;  Service: Cardiovascular;  Laterality: N/A;  . NM MYOVIEW LTD  02/2016    LOW RISK study. No ischemia or infarction. EF 60-65%.  Marland Kitchen PERCUTANEOUS CORONARY STENT INTERVENTION (PCI-S)  01/01/2012   Procedure: PERCUTANEOUS CORONARY STENT INTERVENTION (PCI-S);  Surgeon: Troy Sine, MD;  Location: [facility] CATH LAB;  Service: Cardiovascula: LAD PCI: Promus Element DES 2.75 MM by 30 MM, postdilated to 3.0 MM  . TRANSTHORACIC ECHOCARDIOGRAM  06/2017   EF 55-60%. Grade 1 diastolic dysfunction. Mild MR. no RWMA     Current Meds  Medication Sig  . acetaminophen (TYLENOL) 325 MG tablet Take 2 tablets (650 mg total) by mouth every 6 (six) hours as needed for mild pain or headache (or Fever >/= 101).  Marland Kitchen aspirin EC 81 MG tablet Take 1 tablet (81 mg total) by mouth daily.  Marland Kitchen atorvastatin (LIPITOR) 40 MG tablet Take 1 tablet (40 mg total) by mouth daily at 6 PM.  . esomeprazole (NEXIUM) 40 MG capsule TAKE ONE CAPSULE BY MOUTH EVERY DAY BEFORE BREAKFAST  . metoprolol tartrate (LOPRESSOR) 25 MG tablet Take 0.5 tablets (12.5 mg total) by mouth 2 (two) times daily.  . nitroGLYCERIN (NITROSTAT) 0.4 MG SL tablet TAKE 1 TABLET UNDER THE TONGUE, EVERY 5 MINUETS FOR 3 DOSES, AS NEEDED FOR CHEST PAIN AS DIRECTED  . ondansetron (ZOFRAN ODT) 8 MG disintegrating tablet Take 1 tablet (8 mg total) by mouth every 8 (eight) hours as needed for nausea or vomiting.  Marland Kitchen VOLTAREN 1 % GEL Apply 1 application topically 4 (four) times daily as needed (for pain).     No Known Allergies  Social History   Tobacco Use  . Smoking status: Never Smoker  . Smokeless tobacco: Never Used  Substance Use Topics  . Alcohol use: No  . Drug use: No   Social History   Social History Narrative   She is a native [place] woman. She is married with 5 children and 7 grandchildren. One of her daughters or her granddaughters usually comes as an Astronomer. She does not speak any [place].   The granddaughter with her today  notes that the patient is significantly prone to anxiety and stress related to family issues. Most of her symptoms are related to stressful situations in house.   She does not smoke or drink. She does not routinely exercise.    family history includes Coronary artery disease in her mother.  Wt Readings from Last 3 Encounters:  03/27/19 132 lb (59.9 kg)  11/08/17 133 lb (60.3 kg)  07/15/17 136 lb 3.9 oz (61.8 kg)    PHYSICAL EXAM BP 120/70   Pulse 68   Ht 4\' 11"  (1.499 m)   Wt 132 lb (59.9 kg)   BMI 26.66 kg/m   Physical Exam  Constitutional: She is oriented to person, place, and time. She appears well-developed and well-nourished. No distress.  HENT:  Head: Normocephalic and atraumatic.  Poor dentition  Neck: No hepatojugular reflux and no JVD present. Carotid bruit is not present.  Cardiovascular: Normal rate, regular rhythm and intact  distal pulses.  No extrasystoles are present. PMI is not displaced. Exam reveals no gallop and no friction rub.  Murmur heard.  Medium-pitched harsh crescendo-decrescendo early systolic murmur is present with a grade of 1/6 at the upper right sternal border radiating to the neck. Pulmonary/Chest: Effort normal and breath sounds normal. No respiratory distress. She has no wheezes.  Abdominal: Soft. Bowel sounds are normal. She exhibits no distension. There is no abdominal tenderness. There is no rebound.  Musculoskeletal: Normal range of motion.        General: No edema.  Neurological: She is alert and oriented to person, place, and time.  Skin: Skin is warm and dry.  Psychiatric: She has a normal mood and affect. Her behavior is normal.  Nursing note and vitals reviewed.   Adult ECG Report  Rate: 68 ;  Rhythm: normal sinus rhythm and non-specific ST-T wave changes.  Otherwise normal axis, intervals & durations  Narrative Interpretation: stable EKG   Other studies Reviewed: Additional studies/ records that were reviewed today include:   Recent Labs:  --  Lab Results  Component Value Date   CHOL 141 03/28/2019   HDL 36 (L) 03/28/2019   LDLCALC 79 03/28/2019   TRIG 132 03/28/2019   CHOLHDL 3.9 03/28/2019  Followed by PCP - on statin.  But stopped seeing PCP.    ASSESSMENT / PLAN: Problem List Items Addressed This Visit    NSTEMI, April 2013 (Chronic)   Relevant Orders   EKG 12-Lead (Completed)   Comprehensive metabolic panel (Completed)   Dyslipidemia, goal LDL below 70 (Chronic)    Unfortunately, as it turns out she probably is not really seeing a PCP very frequently and is due to have labs checked.  We will order them for this week.  (Labs noted are as part of an addendum)  Currently on atorvastatin with LDL of 79.  Will monitor 1 more time in roughly 6 months.  May need to consider Zetia.      Relevant Orders   EKG 12-Lead (Completed)   Lipid panel (Completed)   Comprehensive metabolic panel (Completed)   CAD S/P percutaneous coronary angioplasty - DES to LAD April 2013 - Primary (Chronic)    Doing very well from a cardiac standpoint.  No active anginal symptoms. Is on low-dose beta-blocker and atorvastatin along with aspirin.  As long she stays active and has no symptoms, would not pursue further ischemic evaluation.      Relevant Orders   EKG 12-Lead (Completed)   Lipid panel (Completed)   Comprehensive metabolic panel (Completed)   Borderline hypertension (Chronic)    Blood pressure looks great on low-dose beta-blocker.  Would not make any changes.         Somewhat difficult to interview.  Simply because the use of interpreter, more than 25 minutes was required due to get through the entire process.  Greater than 50% of the time was spent in direct counseling and discussion of symptoms.  Current medicines are reviewed at length with the patient today. (+/- concerns) n/a The following changes have been made: n/a  Patient Instructions  Medication Instructions:  Not needed If you need a  refill on your cardiac medications before your next appointment, please call your pharmacy.   Lab work: CMP, LIPID - ON A DAY WITHOUT ANYTHING TO EAT OR DRINK  Testing/Procedures: Not needed  Follow-Up: At [facility], you and your health needs are our priority.  As part of our continuing mission to provide you with  exceptional heart care, we have created designated Provider Care Teams.  These Care Teams include your primary Cardiologist (physician) and Advanced Practice Providers (APPs -  Physician Assistants and Nurse Practitioners) who all work together to provide you with the care you need, when you need it. . You will need a follow up appointment in  12 months.  Please call our office 2 months in advance to schedule this appointment.  You may see Bryan Lemmaavid Tiffani Kadow, MD or one of the following Advanced Practice Providers on your designated Care Team:   . Theodore DemarkRhonda Barrett, PA-C . Joni ReiningKathryn Lawrence, DNP, ANP  Any Other Special Instructions Will Be Listed Below (If Applicable).   Studies Ordered:   Orders Placed This Encounter  Procedures  . Lipid panel  . Comprehensive metabolic panel  . EKG 12-Lead      Bryan Lemmaavid Senon Nixon, M.D., M.S. Interventional Cardiologist   Pager # [phone] Phone # [phone] [address] [place], [place] [address]

## 2019-03-27 NOTE — Patient Instructions (Addendum)
Medication Instructions:  Not needed If you need a refill on your cardiac medications before your next appointment, please call your pharmacy.   Lab work: CMP, LIPID - ON A DAY WITHOUT ANYTHING TO EAT OR DRINK  Testing/Procedures: Not needed  Follow-Up: At [facility], you and your health needs are our priority.  As part of our continuing mission to provide you with exceptional heart care, we have created designated Provider Care Teams.  These Care Teams include your primary Cardiologist (physician) and Advanced Practice Providers (APPs -  Physician Assistants and Nurse Practitioners) who all work together to provide you with the care you need, when you need it. . You will need a follow up appointment in  12 months.  Please call our office 2 months in advance to schedule this appointment.  You may see Glenetta Hew, MD or one of the following Advanced Practice Providers on your designated Care Team:   . Rosaria Ferries, PA-C . Jory Sims, DNP, ANP  Any Other Special Instructions Will Be Listed Below (If Applicable).

## 2019-03-28 LAB — COMPREHENSIVE METABOLIC PANEL
ALT: 25 IU/L (ref 0–32)
AST: 30 IU/L (ref 0–40)
Albumin/Globulin Ratio: 1.8 (ref 1.2–2.2)
Albumin: 4.4 g/dL (ref 3.7–4.7)
Alkaline Phosphatase: 77 IU/L (ref 39–117)
BUN/Creatinine Ratio: 25 (ref 12–28)
BUN: 15 mg/dL (ref 8–27)
Bilirubin Total: 0.5 mg/dL (ref 0.0–1.2)
CO2: 23 mmol/L (ref 20–29)
Calcium: 9.6 mg/dL (ref 8.7–10.3)
Chloride: 103 mmol/L (ref 96–106)
Creatinine, Ser: 0.6 mg/dL (ref 0.57–1.00)
GFR calc Af Amer: 104 mL/min/{1.73_m2} (ref >59)
GFR calc non Af Amer: 90 mL/min/{1.73_m2} (ref >59)
Globulin, Total: 2.5 g/dL (ref 1.5–4.5)
Glucose: 117 mg/dL — ABNORMAL HIGH (ref 65–99)
Potassium: 4.9 mmol/L (ref 3.5–5.2)
Sodium: 143 mmol/L (ref 134–144)
Total Protein: 6.9 g/dL (ref 6.0–8.5)

## 2019-03-28 LAB — LIPID PANEL
Chol/HDL Ratio: 3.9 ratio (ref 0.0–4.4)
Cholesterol, Total: 141 mg/dL (ref 100–199)
HDL: 36 mg/dL — ABNORMAL LOW (ref >39)
LDL Calculated: 79 mg/dL (ref 0–99)
Triglycerides: 132 mg/dL (ref 0–149)
VLDL Cholesterol Cal: 26 mg/dL (ref 5–40)

## 2019-03-29 ENCOUNTER — Encounter: Payer: Self-pay | Admitting: Cardiology

## 2019-03-29 NOTE — Assessment & Plan Note (Signed)
Blood pressure looks great on low-dose beta-blocker.  Would not make any changes.

## 2019-03-29 NOTE — Assessment & Plan Note (Signed)
Unfortunately, as it turns out she probably is not really seeing a PCP very frequently and is due to have labs checked.  We will order them for this week.  (Labs noted are as part of an addendum)  Currently on atorvastatin with LDL of 79.  Will monitor 1 more time in roughly 6 months.  May need to consider Zetia.

## 2019-03-29 NOTE — Assessment & Plan Note (Signed)
Doing very well from a cardiac standpoint.  No active anginal symptoms. Is on low-dose beta-blocker and atorvastatin along with aspirin.  As long she stays active and has no symptoms, would not pursue further ischemic evaluation.

## 2019-03-30 ENCOUNTER — Other Ambulatory Visit: Payer: Self-pay

## 2019-03-30 DIAGNOSIS — E785 Hyperlipidemia, unspecified: Secondary | ICD-10-CM

## 2019-03-30 DIAGNOSIS — Z79899 Other long term (current) drug therapy: Secondary | ICD-10-CM

## 2019-03-30 MED ORDER — ROSUVASTATIN CALCIUM 40 MG PO TABS: 40.0000 mg | ORAL_TABLET | Freq: Every day | ORAL | 6 refills | Status: DC

## 2019-03-30 NOTE — Progress Notes (Signed)
Notes recorded by Leonie Man, MD on 03/29/2019 at 11:14 PM EDT  Cholesterol level looks a little bit worse than it did last year. LDL is up to 79 from 60. I would not make sure that she is actually taking her atorvastatin every day. If she is, would recommend converting from atorvastatin to rosuvastatin 40 mg daily. Recheck labs in 6 months.   If she is not taking atorvastatin, please ensure that she is taking it and recheck in 6 months.

## 2019-04-05 ENCOUNTER — Other Ambulatory Visit: Payer: Self-pay | Admitting: Cardiology

## 2019-04-08 ENCOUNTER — Other Ambulatory Visit: Payer: Self-pay | Admitting: Cardiology

## 2019-06-12 ENCOUNTER — Telehealth: Payer: Self-pay | Admitting: Cardiology

## 2019-06-12 ENCOUNTER — Other Ambulatory Visit: Payer: Self-pay

## 2019-06-12 MED ORDER — ESOMEPRAZOLE MAGNESIUM 40 MG PO CPDR: DELAYED_RELEASE_CAPSULE | ORAL | 8 refills | Status: DC

## 2019-06-12 MED ORDER — ESOMEPRAZOLE MAGNESIUM 40 MG PO CPDR: DELAYED_RELEASE_CAPSULE | ORAL | 1 refills | Status: DC

## 2019-06-12 NOTE — Telephone Encounter (Signed)
New Message    *STAT* If patient is at the pharmacy, call can be transferred to refill team.   1. Which medications need to be refilled? (please list name of each medication and dose if known) esomeprazole (NEXIUM) 40 MG capsule    2. Which pharmacy/location (including street and city if local pharmacy) is medication to be sent to? [id number] - [place], [place] - [address]  3. Do they need a 30 day or 90 day supply? [place]

## 2019-10-23 ENCOUNTER — Other Ambulatory Visit: Payer: Self-pay | Admitting: Cardiology

## 2019-11-03 ENCOUNTER — Other Ambulatory Visit: Payer: Self-pay | Admitting: Cardiology

## 2020-03-01 ENCOUNTER — Encounter: Payer: Self-pay | Admitting: Cardiology

## 2020-03-01 ENCOUNTER — Ambulatory Visit (INDEPENDENT_AMBULATORY_CARE_PROVIDER_SITE_OTHER): Payer: Medicare Other | Admitting: Cardiology

## 2020-03-01 ENCOUNTER — Other Ambulatory Visit: Payer: Self-pay

## 2020-03-01 VITALS — BP 108/72 | HR 64 | Temp 97.2°F | Ht 62.0 in | Wt 131.4 lb

## 2020-03-01 DIAGNOSIS — E785 Hyperlipidemia, unspecified: Secondary | ICD-10-CM

## 2020-03-01 DIAGNOSIS — I214 Non-ST elevation (NSTEMI) myocardial infarction: Secondary | ICD-10-CM

## 2020-03-01 DIAGNOSIS — Z9861 Coronary angioplasty status: Secondary | ICD-10-CM

## 2020-03-01 DIAGNOSIS — I5032 Chronic diastolic (congestive) heart failure: Secondary | ICD-10-CM | POA: Diagnosis not present

## 2020-03-01 DIAGNOSIS — R030 Elevated blood-pressure reading, without diagnosis of hypertension: Secondary | ICD-10-CM

## 2020-03-01 DIAGNOSIS — I2510 Atherosclerotic heart disease of native coronary artery without angina pectoris: Secondary | ICD-10-CM | POA: Diagnosis not present

## 2020-03-01 MED ORDER — METOPROLOL TARTRATE 25 MG PO TABS: 12.5000 mg | ORAL_TABLET | Freq: Two times a day (BID) | ORAL | 11 refills | Status: DC

## 2020-03-01 MED ORDER — ROSUVASTATIN CALCIUM 40 MG PO TABS: 40.0000 mg | ORAL_TABLET | Freq: Every day | ORAL | 11 refills | Status: DC

## 2020-03-01 MED ORDER — ATORVASTATIN CALCIUM 40 MG PO TABS: 40.0000 mg | ORAL_TABLET | Freq: Every day | ORAL | 11 refills | Status: DC

## 2020-03-01 NOTE — Patient Instructions (Addendum)
Medication Instructions:  NOT CHANGES   *If you need a refill on your cardiac medications before your next appointment, please call your pharmacy*   Lab Work: LIPID  CMP FASTING  If you have labs (blood work) drawn today and your tests are completely normal, you will receive your results only by: Marland Kitchen MyChart Message (if you have MyChart) OR . A paper copy in the mail If you have any lab test that is abnormal or we need to change your treatment, we will call you to review the results.   Testing/Procedures: NOT NEEDED   Follow-Up: At [facility], you and your health needs are our priority.  As part of our continuing mission to provide you with exceptional heart care, we have created designated Provider Care Teams.  These Care Teams include your primary Cardiologist (physician) and Advanced Practice Providers (APPs -  Physician Assistants and Nurse Practitioners) who all work together to provide you with the care you need, when you need it.  We recommend signing up for the patient portal called "MyChart".  Sign up information is provided on this After Visit Summary.  MyChart is used to connect with patients for Virtual Visits (Telemedicine).  Patients are able to view lab/test results, encounter notes, upcoming appointments, etc.  Non-urgent messages can be sent to your provider as well.   To learn more about what you can do with MyChart, go to [email or web address].    Your next appointment:   12 month(s)  The format for your next appointment:   In Person  Provider:   Bryan Lemma, MD   Other Instructions  BRING PAPER TO WRITE THE LETTER FOR YOU

## 2020-03-01 NOTE — Progress Notes (Signed)
Primary Care Provider: Fleet ContrasAvbuere, Edwin, MD Cardiologist: Bryan Lemmaavid Sherrie Marsan, MD Electrophysiologist: None  Clinic Note: Chief Complaint  Patient presents with  . Follow-up    Annual  . Coronary Artery Disease    No angina   HPI:    Tammy Murillo is a 74 y.o. female with a PMH below who presents today for annual f/u.   Tammy Murillo was last seen on March 27, 2019 -> was in a chipper mood feeling doing well.  Using virtual language interpreter.  No major problems just limited outside exercise activity due to COVID-19.  Has her chronic dyspnea or chest tightness when very fatigued but otherwise not really that much.  Has some mild exertional fatigue but no anginal type symptoms.  No medication changes.  Labs checked.  Recent Hospitalizations: None  Reviewed  CV studies:    The following studies were reviewed today: (if available, images/films reviewed: From Epic Chart or Care Everywhere) . None:  As is often the case, she is accompanied by her daughter who provides probably more accurate translation of their [place] dialect.  Interval History:   Tammy Murillo returns here today fairly well.  She occasionally feels a discomfort in her chest that feels like a dull pressure and is relieved with nitroglycerin.  This happens maybe once or twice a month when she is very tired and has been doing a lot during the course of the day.  Not with routine exercise and not very often. She has off-and-on dizziness with standing up, but no syncope or near syncope.  No rapid irregular heartbeats or palpitations. She denies any frequent angina or heart failure symptoms.    CV Review of Symptoms (Summary) Cardiovascular ROS: positive for - chest pain, irregular heartbeat and palpitations negative for - dyspnea on exertion, orthopnea, paroxysmal nocturnal dyspnea, rapid heart rate, shortness of breath or Syncope/near syncope, TIA/amaurosis fugax, claudication.  The patient does not have symptoms  concerning for COVID-19 infection (fever, chills, cough, or new shortness of breath).  The patient is practicing social distancing & Masking.   She has had her COVID-19 vaccine injections.  REVIEWED OF SYSTEMS   Review of Systems  Constitutional: Positive for malaise/fatigue (Every now and then will feel very tired and worn out after a long day and can have some chest discomfort).  HENT: Negative for congestion and nosebleeds.   Respiratory: Negative for shortness of breath.   Gastrointestinal: Negative for abdominal pain, blood in stool and melena.  Genitourinary: Negative for hematuria.  Musculoskeletal: Positive for joint pain. Negative for falls and myalgias.  Neurological: Negative for dizziness, weakness and headaches.  Psychiatric/Behavioral: Negative.    I have reviewed and (if needed) personally updated the patient's problem list, medications, allergies, past medical and surgical history, social and family history.   PAST MEDICAL HISTORY   Past Medical History:  Diagnosis Date  . Anxiety   . Borderline hypertension   . CAD S/P percutaneous coronary angioplasty 12/31/2011   3 sequential LAD lesions treated with a single Promus Element DES; - Myoview 02/2016: No infarct or ischemia. EF 60-65%.  . Costochondritis   . Dyslipidemia, goal LDL below 70   . H/O echocardiogram April 2014   Normal EF: 55-60%, no regional WMA, grade 1 diastolic dysfunction. Mild MR  . History of colon cancer  2010  . History of: Non-ST elevation MI (NSTEMI) 12/31/2011   Severe LAD lesions: Sequential 90%, 70% and 60%; no other significant stenoses. She had had a normal Myoview  in 2012 AS WELL AS POST-PCI IN 2017  . Osteoarthritis   . Presence of drug coated stent in LAD coronary artery 01/01/2012   Promus Element DES 2.75 mm x38 mm postdilated to 3.0 mm    PAST SURGICAL HISTORY   Past Surgical History:  Procedure Laterality Date  . CHOLECYSTECTOMY  2004  . LEFT HEART CATHETERIZATION WITH  CORONARY ANGIOGRAM N/A 01/01/2012   Procedure: LEFT HEART CATHETERIZATION WITH CORONARY ANGIOGRAM;  Surgeon: Lennette Bihari, MD;  Location: [facility] CATH LAB;  Service: Cardiovascular;  Laterality: N/A;  . NM MYOVIEW LTD  02/2016    LOW RISK study. No ischemia or infarction. EF 60-65%.  Marland Kitchen PERCUTANEOUS CORONARY STENT INTERVENTION (PCI-S)  01/01/2012   Procedure: PERCUTANEOUS CORONARY STENT INTERVENTION (PCI-S);  Surgeon: Lennette Bihari, MD;  Location: [facility] CATH LAB;  Service: Cardiovascula: LAD PCI: Promus Element DES 2.75 MM by 30 MM, postdilated to 3.0 MM  . TRANSTHORACIC ECHOCARDIOGRAM  06/2017   EF 55-60%. Grade 1 diastolic dysfunction. Mild MR. no RWMA    MEDICATIONS/ALLERGIES   Current Meds  Medication Sig  . acetaminophen (TYLENOL) 325 MG tablet Take 2 tablets (650 mg total) by mouth every 6 (six) hours as needed for mild pain or headache (or Fever >/= 101).  Marland Kitchen aspirin EC 81 MG tablet Take 1 tablet (81 mg total) by mouth daily.  Marland Kitchen atorvastatin (LIPITOR) 40 MG tablet Take 1 tablet (40 mg total) by mouth daily at 6 PM.  . diclofenac Sodium (VOLTAREN) 1 % GEL Apply 1 application topically 4 (four) times daily.  Marland Kitchen esomeprazole (NEXIUM) 40 MG capsule TAKE ONE CAPSULE BY MOUTH EVERY DAY BEFORE BREAKFAST  . methylPREDNISolone (MEDROL DOSEPAK) 4 MG TBPK tablet   . metoprolol tartrate (LOPRESSOR) 25 MG tablet Take 0.5 tablets (12.5 mg total) by mouth 2 (two) times daily.  . nitroGLYCERIN (NITROSTAT) 0.4 MG SL tablet TAKE 1 TABLET UNDER THE TONGUE, EVERY 5 MINUETS FOR 3 DOSES, AS NEEDED FOR CHEST PAIN AS DIRECTED  . ondansetron (ZOFRAN ODT) 8 MG disintegrating tablet Take 1 tablet (8 mg total) by mouth every 8 (eight) hours as needed for nausea or vomiting.  Marland Kitchen tiZANidine (ZANAFLEX) 4 MG tablet Take 4 mg by mouth 2 (two) times daily.  . VOLTAREN 1 % GEL Apply 1 application topically 4 (four) times daily as needed (for pain).   . [DISCONTINUED] atorvastatin (LIPITOR) 40 MG tablet TAKE 1 TABLET (40 MG TOTAL)  BY MOUTH DAILY AT 6 PM.  . [DISCONTINUED] metoprolol tartrate (LOPRESSOR) 25 MG tablet TAKE 1/2 TABLET BY MOUTH TWICE A DAY    No Known Allergies  SOCIAL HISTORY/FAMILY HISTORY   Reviewed in Epic:  Pertinent findings: n/a  OBJCTIVE -PE, EKG, labs   Wt Readings from Last 3 Encounters:  03/01/20 131 lb 6.4 oz (59.6 kg)  03/27/19 132 lb (59.9 kg)  11/08/17 133 lb (60.3 kg)    Physical Exam: BP 108/72   Pulse 64   Temp (!) 97.2 F (36.2 C)   Ht 5\' 2"  (1.575 m)   Wt 131 lb 6.4 oz (59.6 kg)   SpO2 97%   BMI 24.03 kg/m  Physical Exam Constitutional:      General: She is not in acute distress.    Appearance: Normal appearance. She is normal weight.     Comments: Healthy-appearing.  Well-groomed.  HENT:     Head: Normocephalic and atraumatic.     Mouth/Throat:     Comments: Poor dentition Neck:     Vascular: No  carotid bruit.  Cardiovascular:     Rate and Rhythm: Normal rate and regular rhythm.     Pulses: Normal pulses.     Heart sounds: S1 normal and S2 normal. Murmur heard.  Harsh crescendo-decrescendo early systolic murmur is present with a grade of 1/6 at the upper right sternal border radiating to the neck.  No friction rub. No gallop.   Pulmonary:     Effort: Pulmonary effort is normal. No respiratory distress.     Breath sounds: Normal breath sounds. No wheezing, rhonchi or rales.  Chest:     Chest wall: Tenderness (Focal tenderness along the left sternal border) present.  Musculoskeletal:        General: No swelling. Normal range of motion.     Cervical back: Normal range of motion and neck supple.  Neurological:     General: No focal deficit present.     Mental Status: She is alert and oriented to person, place, and time.  Psychiatric:        Mood and Affect: Mood normal.        Behavior: Behavior normal.        Thought Content: Thought content normal.        Judgment: Judgment normal.     Adult ECG Report  Rate: 64;  Rhythm: normal sinus rhythm and  Nonspecific ST-T wave changes.  Cannot exclude anteroseptal ischemia.;   Narrative Interpretation: Stable EKG.  Recent Labs: Due for follow-up Lab Results  Component Value Date   CHOL 141 03/28/2019   HDL 36 (L) 03/28/2019   LDLCALC 79 03/28/2019   TRIG 132 03/28/2019   CHOLHDL 3.9 03/28/2019   Lab Results  Component Value Date   CREATININE 0.60 03/28/2019   BUN 15 03/28/2019   NA 143 03/28/2019   K 4.9 03/28/2019   CL 103 03/28/2019   CO2 23 03/28/2019   Lab Results  Component Value Date   TSH 2.525 07/15/2017    ASSESSMENT/PLAN    Problem List Items Addressed This Visit    CAD S/P percutaneous coronary angioplasty - DES to LAD April 2013 (Chronic)    Doing fairly well.  No real angina.  I told her that if she starts having more the chest pain spells, may need to evaluate, but otherwise would simply monitor.  Plan: Continue aspirin without Thienopyridine.  She is on low-dose metoprolol and stable dose of atorvastatin.      Relevant Medications   atorvastatin (LIPITOR) 40 MG tablet   metoprolol tartrate (LOPRESSOR) 25 MG tablet   rosuvastatin (CRESTOR) 40 MG tablet   Other Relevant Orders   EKG 12-Lead (Completed)   Lipid panel   Comprehensive metabolic panel   NSTEMI, April 2013 - Primary (Chronic)    She is now 8 years out from her MI.  Doing well with no recurrent symptoms.  Most recent ischemic evaluation were stable.  Unfortunately history is a difficult thing with her.  She has lots of work symptoms, and the actual non-STEMI is unexpected.      Relevant Medications   atorvastatin (LIPITOR) 40 MG tablet   metoprolol tartrate (LOPRESSOR) 25 MG tablet   rosuvastatin (CRESTOR) 40 MG tablet   Other Relevant Orders   EKG 12-Lead (Completed)   Borderline hypertension (Chronic)    Borderline low blood pressure.  Only able to tolerate low-dose Lopressor.      Dyslipidemia, goal LDL below 70 (Chronic)    Due for lipid check.  Will check CMP and lipids.  Last  evaluation showed the LDL was not quite at goal.  My plan had been to either convert to rosuvastatin or add Zetia.      Relevant Medications   atorvastatin (LIPITOR) 40 MG tablet   metoprolol tartrate (LOPRESSOR) 25 MG tablet   rosuvastatin (CRESTOR) 40 MG tablet   Other Relevant Orders   Lipid panel   Comprehensive metabolic panel   Chronic diastolic CHF (congestive heart failure) (HCC) (Chronic)    No active heart failure symptoms.  Euvolemic on exam.  Blood pressure is borderline low on low-dose metoprolol tartrate.  No plans to add ARB or ACE inhibitor.  Not on diuretic.      Relevant Medications   atorvastatin (LIPITOR) 40 MG tablet   metoprolol tartrate (LOPRESSOR) 25 MG tablet   rosuvastatin (CRESTOR) 40 MG tablet   Other Relevant Orders   EKG 12-Lead (Completed)   Comprehensive metabolic panel       [id number] Education: The signs and symptoms of COVID-19 were discussed with the patient and how to seek care for testing (follow up with PCP or arrange E-visit).   The importance of social distancing and COVID-19 vaccination was discussed today.  I spent a total of 65minutes with the patient. >  50% of the time was spent in direct patient consultation.  Additional time spent with chart review  / charting (studies, outside notes, etc): 8 Total Time: 30  min   Current medicines are reviewed at length with the patient today.  (+/- concerns) n/a  Notice: This dictation was prepared with Dragon dictation along with smaller phrase technology. Any transcriptional errors that result from this process are unintentional and may not be corrected upon review.  Patient Instructions / Medication Changes & Studies & Tests Ordered   Patient Instructions  Medication Instructions:  NOT CHANGES   *If you need a refill on your cardiac medications before your next appointment, please call your pharmacy*   Lab Work: LIPID  CMP FASTING  If you have labs (blood work) drawn today and  your tests are completely normal, you will receive your results only by: Marland Kitchen MyChart Message (if you have MyChart) OR . A paper copy in the mail If you have any lab test that is abnormal or we need to change your treatment, we will call you to review the results.   Testing/Procedures: NOT NEEDED   Follow-Up: At [facility], you and your health needs are our priority.  As part of our continuing mission to provide you with exceptional heart care, we have created designated Provider Care Teams.  These Care Teams include your primary Cardiologist (physician) and Advanced Practice Providers (APPs -  Physician Assistants and Nurse Practitioners) who all work together to provide you with the care you need, when you need it.  We recommend signing up for the patient portal called "MyChart".  Sign up information is provided on this After Visit Summary.  MyChart is used to connect with patients for Virtual Visits (Telemedicine).  Patients are able to view lab/test results, encounter notes, upcoming appointments, etc.  Non-urgent messages can be sent to your provider as well.   To learn more about what you can do with MyChart, go to [email or web address].    Your next appointment:   12 month(s)  The format for your next appointment:   In Person  Provider:   Glenetta Hew, MD   Other Instructions  BRING PAPER TO [place] YOU     Studies Ordered:   Orders Placed  This Encounter  Procedures  . Lipid panel  . Comprehensive metabolic panel  . EKG 12-Lead     Bryan Lemma, M.D., M.S. Interventional Cardiologist   Pager # [phone] Phone # [phone] [address] [place], [place] [address]   Thank you for choosing Heartcare at [facility]!!

## 2020-03-07 ENCOUNTER — Encounter: Payer: Self-pay | Admitting: Cardiology

## 2020-03-07 NOTE — Assessment & Plan Note (Signed)
Due for lipid check.  Will check CMP and lipids.  Last evaluation showed the LDL was not quite at goal.  My plan had been to either convert to rosuvastatin or add Zetia.

## 2020-03-07 NOTE — Assessment & Plan Note (Signed)
Doing fairly well.  No real angina.  I told her that if she starts having more the chest pain spells, may need to evaluate, but otherwise would simply monitor.  Plan: Continue aspirin without Thienopyridine.  She is on low-dose metoprolol and stable dose of atorvastatin.

## 2020-03-07 NOTE — Assessment & Plan Note (Signed)
No active heart failure symptoms.  Euvolemic on exam.  Blood pressure is borderline low on low-dose metoprolol tartrate.  No plans to add ARB or ACE inhibitor.  Not on diuretic.

## 2020-03-07 NOTE — Assessment & Plan Note (Signed)
Borderline low blood pressure.  Only able to tolerate low-dose Lopressor.

## 2020-03-07 NOTE — Assessment & Plan Note (Signed)
She is now 8 years out from her MI.  Doing well with no recurrent symptoms.  Most recent ischemic evaluation were stable.  Unfortunately history is a difficult thing with her.  She has lots of work symptoms, and the actual non-STEMI is unexpected.

## 2020-03-12 LAB — COMPREHENSIVE METABOLIC PANEL
ALT: 30 IU/L (ref 0–32)
AST: 40 IU/L (ref 0–40)
Albumin/Globulin Ratio: 1.3 (ref 1.2–2.2)
Albumin: 4.2 g/dL (ref 3.7–4.7)
Alkaline Phosphatase: 94 IU/L (ref 48–121)
BUN/Creatinine Ratio: 25 (ref 12–28)
BUN: 16 mg/dL (ref 8–27)
Bilirubin Total: 0.3 mg/dL (ref 0.0–1.2)
CO2: 24 mmol/L (ref 20–29)
Calcium: 9.4 mg/dL (ref 8.7–10.3)
Chloride: 103 mmol/L (ref 96–106)
Creatinine, Ser: 0.64 mg/dL (ref 0.57–1.00)
GFR calc Af Amer: 102 mL/min/{1.73_m2} (ref >59)
GFR calc non Af Amer: 88 mL/min/{1.73_m2} (ref >59)
Globulin, Total: 3.3 g/dL (ref 1.5–4.5)
Glucose: 110 mg/dL — ABNORMAL HIGH (ref 65–99)
Potassium: 4.8 mmol/L (ref 3.5–5.2)
Sodium: 141 mmol/L (ref 134–144)
Total Protein: 7.5 g/dL (ref 6.0–8.5)

## 2020-03-12 LAB — LIPID PANEL
Chol/HDL Ratio: 3 ratio (ref 0.0–4.4)
Cholesterol, Total: 127 mg/dL (ref 100–199)
HDL: 42 mg/dL (ref >39)
LDL Chol Calc (NIH): 69 mg/dL (ref 0–99)
Triglycerides: 84 mg/dL (ref 0–149)
VLDL Cholesterol Cal: 16 mg/dL (ref 5–40)

## 2020-03-25 ENCOUNTER — Telehealth: Payer: Self-pay | Admitting: Cardiology

## 2020-03-25 NOTE — Telephone Encounter (Signed)
Patient is calling for lab results.

## 2020-03-25 NOTE — Telephone Encounter (Signed)
Attempted to call patient back via interpretor. Unable to leave a voicemail.

## 2020-03-29 NOTE — Telephone Encounter (Signed)
Results reviewed with my comments..  Labs look good.  Lipids look great.  No change in medications.   Bryan Lemma, MD

## 2020-03-29 NOTE — Telephone Encounter (Signed)
No result note on labs. Sent to MD and primary nurse

## 2020-04-09 ENCOUNTER — Other Ambulatory Visit: Payer: Self-pay

## 2020-04-09 ENCOUNTER — Other Ambulatory Visit: Payer: Self-pay | Admitting: Cardiology

## 2020-04-09 MED ORDER — ATORVASTATIN CALCIUM 40 MG PO TABS: 40.0000 mg | ORAL_TABLET | Freq: Every day | ORAL | 11 refills | Status: DC

## 2020-04-09 NOTE — Telephone Encounter (Signed)
atorvastatin (LIPITOR) 40 MG tablet 30 tablet 11 04/09/2020    Sig - Route: Take 1 tablet (40 mg total) by mouth daily at 6 PM. - Oral   Sent to pharmacy as: atorvastatin (LIPITOR) 40 MG tablet   E-Prescribing Status: Receipt confirmed by pharmacy (04/09/2020 1:01 PM EDT)   Pharmacy  [id number] - [place], [place] - [address]

## 2020-04-09 NOTE — Telephone Encounter (Signed)
Follow Up:    Daughter is calling for pt's last lab results pleasel

## 2020-04-09 NOTE — Telephone Encounter (Signed)
Called- gave results.  She states they dropped off paperwork at the last visit- and want to know if the nurse has had him sign it before. It goes to immigration. Advised I was unaware of this- but I would sent to his nurse to advise.

## 2020-04-09 NOTE — Telephone Encounter (Signed)
*  STAT* If patient is at the pharmacy, call can be transferred to refill team.   1. Which medications need to be refilled? (please list name of each medication and dose if known) Atorvastatin  2. Which pharmacy/location (including street and city if local pharmacy) is medication to be sent to?     [facility] [address], [place]   3. Do they need a 30 day or 90 day supply? 30 days and refills

## 2020-04-16 NOTE — Telephone Encounter (Signed)
Result given of labs  informed daughter  Papers are ready to be picked up  She states she and patient will be by to pick up

## 2020-06-06 DIAGNOSIS — M5136 Other intervertebral disc degeneration, lumbar region: Secondary | ICD-10-CM | POA: Diagnosis not present

## 2020-06-06 DIAGNOSIS — I2510 Atherosclerotic heart disease of native coronary artery without angina pectoris: Secondary | ICD-10-CM | POA: Diagnosis not present

## 2020-06-06 DIAGNOSIS — J302 Other seasonal allergic rhinitis: Secondary | ICD-10-CM | POA: Diagnosis not present

## 2020-06-06 DIAGNOSIS — E7849 Other hyperlipidemia: Secondary | ICD-10-CM | POA: Diagnosis not present

## 2020-06-17 ENCOUNTER — Other Ambulatory Visit: Payer: Self-pay | Admitting: Cardiology

## 2020-09-29 DIAGNOSIS — Z03818 Encounter for observation for suspected exposure to other biological agents ruled out: Secondary | ICD-10-CM | POA: Diagnosis not present

## 2020-11-20 ENCOUNTER — Other Ambulatory Visit: Payer: Self-pay | Admitting: Cardiology

## 2021-02-19 ENCOUNTER — Other Ambulatory Visit: Payer: Self-pay | Admitting: Cardiology

## 2021-02-25 ENCOUNTER — Ambulatory Visit (INDEPENDENT_AMBULATORY_CARE_PROVIDER_SITE_OTHER): Payer: Medicare Other | Admitting: Cardiology

## 2021-02-25 ENCOUNTER — Encounter: Payer: Self-pay | Admitting: Cardiology

## 2021-02-25 ENCOUNTER — Other Ambulatory Visit: Payer: Self-pay

## 2021-02-25 VITALS — BP 122/64 | HR 62 | Ht 62.0 in | Wt 132.2 lb

## 2021-02-25 DIAGNOSIS — R072 Precordial pain: Secondary | ICD-10-CM | POA: Diagnosis not present

## 2021-02-25 DIAGNOSIS — I214 Non-ST elevation (NSTEMI) myocardial infarction: Secondary | ICD-10-CM | POA: Diagnosis not present

## 2021-02-25 DIAGNOSIS — R079 Chest pain, unspecified: Secondary | ICD-10-CM | POA: Diagnosis not present

## 2021-02-25 DIAGNOSIS — Z9861 Coronary angioplasty status: Secondary | ICD-10-CM

## 2021-02-25 DIAGNOSIS — E785 Hyperlipidemia, unspecified: Secondary | ICD-10-CM

## 2021-02-25 DIAGNOSIS — I2510 Atherosclerotic heart disease of native coronary artery without angina pectoris: Secondary | ICD-10-CM | POA: Diagnosis not present

## 2021-02-25 LAB — COMPREHENSIVE METABOLIC PANEL
ALT: 38 IU/L — ABNORMAL HIGH (ref 0–32)
AST: 55 IU/L — ABNORMAL HIGH (ref 0–40)
Albumin/Globulin Ratio: 1.4 (ref 1.2–2.2)
Albumin: 4.4 g/dL (ref 3.7–4.7)
Alkaline Phosphatase: 83 IU/L (ref 44–121)
BUN/Creatinine Ratio: 38 — ABNORMAL HIGH (ref 12–28)
BUN: 23 mg/dL (ref 8–27)
Bilirubin Total: 0.3 mg/dL (ref 0.0–1.2)
CO2: 22 mmol/L (ref 20–29)
Calcium: 9.6 mg/dL (ref 8.7–10.3)
Chloride: 104 mmol/L (ref 96–106)
Creatinine, Ser: 0.6 mg/dL (ref 0.57–1.00)
Globulin, Total: 3.2 g/dL (ref 1.5–4.5)
Glucose: 115 mg/dL — ABNORMAL HIGH (ref 65–99)
Potassium: 4.4 mmol/L (ref 3.5–5.2)
Sodium: 142 mmol/L (ref 134–144)
Total Protein: 7.6 g/dL (ref 6.0–8.5)
eGFR: 94 mL/min/{1.73_m2} (ref >59)

## 2021-02-25 LAB — LIPID PANEL
Chol/HDL Ratio: 3.3 ratio (ref 0.0–4.4)
Cholesterol, Total: 110 mg/dL (ref 100–199)
HDL: 33 mg/dL — ABNORMAL LOW (ref >39)
LDL Chol Calc (NIH): 58 mg/dL (ref 0–99)
Triglycerides: 99 mg/dL (ref 0–149)
VLDL Cholesterol Cal: 19 mg/dL (ref 5–40)

## 2021-02-25 MED ORDER — ISOSORBIDE MONONITRATE ER 30 MG PO TB24: 30.0000 mg | ORAL_TABLET | Freq: Every day | ORAL | 3 refills | Status: DC

## 2021-02-25 NOTE — Assessment & Plan Note (Signed)
Reproducible chest pain on exam is probably costochondritis.  We will exclude ischemia with a mild view and treat with error, but I suspect that this may very well be costochondritis in which case I would recommend Voltaren gel topically along with Tylenol.

## 2021-02-25 NOTE — Progress Notes (Signed)
Primary Care Provider: Fleet Contras, MD Cardiologist: Bryan Lemma, MD Electrophysiologist: None  Clinic Note: Chief Complaint  Patient presents with  . Follow-up    Annual  . Chest Pain    Having more episodes of chest pain.  Sometimes use nitroglycerin.  . Coronary Artery Disease    Having chest pain.    ===================================  ASSESSMENT/PLAN   Problem List Items Addressed This Visit    Exertional chest pain    She continues to have this exertional chest discomfort.  Based on the confusing history, it is not unreasonable to evaluate for ischemia.  Last stress test was in 2017.  Plan:  Lexiscan Myoview  Add Imdur to the low-dose metoprolol.       Relevant Orders   EKG 12-Lead   MYOCARDIAL PERFUSION IMAGING   Cardiac Stress Test: Informed Consent Details: Physician/Practitioner Attestation; Transcribe to consent form and obtain patient signature   Precordial pain    Reproducible chest pain on exam is probably costochondritis.  We will exclude ischemia with a mild view and treat with error, but I suspect that this may very well be costochondritis in which case I would recommend Voltaren gel topically along with Tylenol.      Relevant Orders   MYOCARDIAL PERFUSION IMAGING   Cardiac Stress Test: Informed Consent Details: Physician/Practitioner Attestation; Transcribe to consent form and obtain patient signature   CAD S/P percutaneous coronary angioplasty - DES to LAD April 2013 - Primary (Chronic)    CBC she essentially only has single-vessel disease with a 99% LAD lesion treated with DES stent in the setting of non-STEMI. She is now having more frequent episodes of chest pain-difficult to tell if this is true anginal symptoms with or reproducible costochondritis..  Plan:  Continue aspirin 81 mg along with current dose of rosuvastatin 40 mg  On very low-dose beta-blocker.  We will add Imdur 30 mg for possible anginal relief.      Relevant  Medications   isosorbide mononitrate (IMDUR) 30 MG 24 hr tablet   Other Relevant Orders   EKG 12-Lead   MYOCARDIAL PERFUSION IMAGING   Cardiac Stress Test: Informed Consent Details: Physician/Practitioner Attestation; Transcribe to consent form and obtain patient signature   Lipid panel   Comprehensive metabolic panel   NSTEMI, April 2013 (Chronic)    9 years out from MI.  This was somewhat surprising because she clearly has classic breast pains.  However this time she presented with ACS and was found to have a's significant LAD lesion.  As such, I do not want to disregard her chest pain episodes.  Normal preserved EF.  Unfortunately she has had perpetual chest discomfort from before and after the MI.  Difficult to determine symptoms.      Relevant Medications   isosorbide mononitrate (IMDUR) 30 MG 24 hr tablet   Other Relevant Orders   EKG 12-Lead   MYOCARDIAL PERFUSION IMAGING   Cardiac Stress Test: Informed Consent Details: Physician/Practitioner Attestation; Transcribe to consent form and obtain patient signature   Hyperlipidemia with target LDL less than 70 (Chronic)    Labs well-controlled as of last June.  Due for follow-up labs now.  Continue on rosuvastatin.  Plan to check labs when she comes in for her Myoview.      Relevant Medications   isosorbide mononitrate (IMDUR) 30 MG 24 hr tablet      ===================================  HPI:    Tammy Murillo is a 75 y.o. female with a PMH notable for CAD-PCI (  non-STEMI in April 2013) who presents today for annual follow-up.  Tammy Murillo was last seen on March 01, 2020.  She is doing fairly well.  Occasional chest discomfort like a dullness only occasional nitroglycerin.  Maybe once twice a month.  Happens when she is tired.  Not with routine exercise.  Off-and-on dizziness with standing.  Otherwise no syncope or near syncope. -> Lipids and chemistry checked.  Annual follow-up.  No medicine changes  Recent Hospitalizations:  None  Reviewed  CV studies:    The following studies were reviewed today: (if available, images/films reviewed: From Epic Chart or Care Everywhere) . None:   Interval History:   Tammy Murillo is here today as usual with her daughter serves as Equities trader.  The daughter says that she is not doing well of late, having more frequent chest pains.  Very confusing historian however.  She can have chest pain with exertion or without exertion.  It can happen when she gets excited or anxious.  If you start all it will come on.  Usually goes away after few minutes of rest, but occasionally she has to take nitroglycerin.  Sometimes she takes as many as 2 nitroglycerin.  She does not take nitro until ithas lasted more than 5 minutes.  Sometimes she can go a couple days without symptoms and sometimes she will have a couple episodes per day.  No real rhyme or reason.  She is also having remittent headaches.  She may get dizzy and lightheaded with either headaches or with a chest pain but they do not always happen together.  With the chest pain she really denies any dyspnea.  No PND, orthopnea with trivial mild ankle swelling.  CV Review of Symptoms (Summary): positive for - chest pain, edema and Dizziness. negative for - dyspnea on exertion, irregular heartbeat, orthopnea, palpitations, paroxysmal nocturnal dyspnea, rapid heart rate, shortness of breath or Syncope/near syncope, TIA/amaurosis fugax, claudication   REVIEWED OF SYSTEMS   Review of Systems  Constitutional: Positive for malaise/fatigue (Sometimes feels more more tired than usual.). Negative for weight loss.  HENT: Negative for congestion and nosebleeds.   Respiratory: Negative for cough, shortness of breath and wheezing.   Cardiovascular: Positive for chest pain.  Gastrointestinal: Negative for abdominal pain, blood in stool and melena.  Genitourinary: Negative for hematuria.  Musculoskeletal: Negative for joint pain.  Neurological:  Positive for dizziness and headaches. Negative for focal weakness and weakness.  Psychiatric/Behavioral: Negative for depression and memory loss. The patient is nervous/anxious (Easily startled). The patient does not have insomnia.    I have reviewed and (if needed) personally updated the patient's problem list, medications, allergies, past medical and surgical history, social and family history.   PAST MEDICAL HISTORY   Past Medical History:  Diagnosis Date  . Anxiety   . Borderline hypertension   . CAD S/P percutaneous coronary angioplasty 12/31/2011   3 sequential LAD lesions treated with a single Promus Element DES; - Myoview 02/2016: No infarct or ischemia. EF 60-65%.  . Costochondritis   . Dyslipidemia, goal LDL below 70   . H/O echocardiogram April 2014   Normal EF: 55-60%, no regional WMA, grade 1 diastolic dysfunction. Mild MR  . History of colon cancer  2010  . History of: Non-ST elevation MI (NSTEMI) 12/31/2011   Severe LAD lesions: Sequential 90%, 70% and 60%; no other significant stenoses. She had had a normal Myoview in 2012 AS WELL AS POST-PCI IN 2017  . Osteoarthritis   .  Presence of drug coated stent in LAD coronary artery 01/01/2012   Promus Element DES 2.75 mm x38 mm postdilated to 3.0 mm    PAST SURGICAL HISTORY   Past Surgical History:  Procedure Laterality Date  . CHOLECYSTECTOMY  2004  . LEFT HEART CATHETERIZATION WITH CORONARY ANGIOGRAM N/A 01/01/2012   Procedure: LEFT HEART CATHETERIZATION WITH CORONARY ANGIOGRAM;  Surgeon: Lennette Bihari, MD;  Location: [facility] CATH LAB;  Service: Cardiovascular;  Laterality: N/A;  . NM MYOVIEW LTD  02/2016    LOW RISK study. No ischemia or infarction. EF 60-65%.  Marland Kitchen PERCUTANEOUS CORONARY STENT INTERVENTION (PCI-S)  01/01/2012   Procedure: PERCUTANEOUS CORONARY STENT INTERVENTION (PCI-S);  Surgeon: Lennette Bihari, MD;  Location: [facility] CATH LAB;  Service: Cardiovascula: LAD PCI: Promus Element DES 2.75 MM by 30 MM, postdilated to 3.0 MM   . TRANSTHORACIC ECHOCARDIOGRAM  06/2017   EF 55-60%. Grade 1 diastolic dysfunction. Mild MR. no RWMA     There is no immunization history on file for this patient.  MEDICATIONS/ALLERGIES   Current Meds  Medication Sig  . acetaminophen (TYLENOL) 325 MG tablet Take 2 tablets (650 mg total) by mouth every 6 (six) hours as needed for mild pain or headache (or Fever >/= 101).  Marland Kitchen aspirin EC 81 MG tablet Take 1 tablet (81 mg total) by mouth daily.  . diclofenac Sodium (VOLTAREN) 1 % GEL Apply 1 application topically 4 (four) times daily.  Marland Kitchen esomeprazole (NEXIUM) 40 MG capsule TAKE 1 CAPSULE BY MOUTH EVERY DAY BEFORE BREAKFAST  . isosorbide mononitrate (IMDUR) 30 MG 24 hr tablet Take 1 tablet (30 mg total) by mouth daily. Take 30 minutes after taking 81 mg Aspirin  . methylPREDNISolone (MEDROL DOSEPAK) 4 MG TBPK tablet   . metoprolol tartrate (LOPRESSOR) 25 MG tablet Take 0.5 tablets (12.5 mg total) by mouth 2 (two) times daily.  . nitroGLYCERIN (NITROSTAT) 0.4 MG SL tablet TAKE 1 TABLET UNDER THE TONGUE, EVERY 5 MINUETS FOR 3 DOSES, AS NEEDED FOR CHEST PAIN AS DIRECTED  . ondansetron (ZOFRAN ODT) 8 MG disintegrating tablet Take 1 tablet (8 mg total) by mouth every 8 (eight) hours as needed for nausea or vomiting.  . rosuvastatin (CRESTOR) 40 MG tablet TAKE 1 TABLET BY MOUTH EVERY DAY  . VOLTAREN 1 % GEL Apply 1 application topically 4 (four) times daily as needed (for pain).     No Known Allergies  SOCIAL HISTORY/FAMILY HISTORY   Reviewed in Epic:  Pertinent findings:  Social History   Tobacco Use  . Smoking status: Never Smoker  . Smokeless tobacco: Never Used  Vaping Use  . Vaping Use: Never used  Substance Use Topics  . Alcohol use: No  . Drug use: No   Social History   Social History Narrative   She is a native [place] woman. She is married with 5 children and 7 grandchildren. One of her daughters or her granddaughters usually comes as an Equities trader. She does not speak  any [place].   The granddaughter with her today notes that the patient is significantly prone to anxiety and stress related to family issues. Most of her symptoms are related to stressful situations in house.   She does not smoke or drink. She does not routinely exercise.    OBJCTIVE -PE, EKG, labs   Wt Readings from Last 3 Encounters:  02/25/21 132 lb 3.2 oz (60 kg)  03/01/20 131 lb 6.4 oz (59.6 kg)  03/27/19 132 lb (59.9 kg)    Physical Exam:  BP 122/64   Pulse 62   Ht 5\' 2"  (1.575 m)   Wt 132 lb 3.2 oz (60 kg)   SpO2 96%   BMI 24.18 kg/m  Physical Exam Vitals reviewed.  Constitutional:      General: She is not in acute distress.    Appearance: Normal appearance. She is normal weight. She is not ill-appearing or toxic-appearing.  HENT:     Head: Normocephalic and atraumatic.  Neck:     Vascular: Carotid bruit present.  Cardiovascular:     Rate and Rhythm: Normal rate and regular rhythm.  No extrasystoles are present.    Chest Wall: PMI is not displaced.     Pulses: Normal pulses and intact distal pulses.     Heart sounds: Murmur (Soft 1/6 SEM at RUSB) heard.  No friction rub. No gallop.   Pulmonary:     Effort: Pulmonary effort is normal. No respiratory distress.     Breath sounds: Normal breath sounds. No wheezing, rhonchi or rales.  Chest:     Chest wall: Tenderness (She has reproducible chest discomfort along both sides of the sternum, worse on the left, may be the third rib is the worst.  This reproduces her chest pain.) present.  Musculoskeletal:        General: No swelling (Trivial). Normal range of motion.     Cervical back: Normal range of motion and neck supple.  Skin:    General: Skin is warm and dry.  Neurological:     General: No focal deficit present.     Mental Status: She is alert. Mental status is at baseline.  Psychiatric:        Mood and Affect: Mood normal.        Behavior: Behavior normal.        Thought Content: Thought content normal.         Judgment: Judgment normal.      Adult ECG Report  Rate: 62;  Rhythm: normal sinus rhythm and ST-T wave abnormality, consider anterior ischemia.  Stable;   Narrative Interpretation: Stable EKG.  Recent Labs: Reviewed.  Due for labs next month. Lab Results  Component Value Date   CHOL 127 03/11/2020   HDL 42 03/11/2020   LDLCALC 69 03/11/2020   TRIG 84 03/11/2020   CHOLHDL 3.0 03/11/2020   Lab Results  Component Value Date   CREATININE 0.64 03/11/2020   BUN 16 03/11/2020   NA 141 03/11/2020   K 4.8 03/11/2020   CL 103 03/11/2020   CO2 24 03/11/2020   CBC Latest Ref Rng & Units 07/17/2017 07/16/2017 07/15/2017  WBC 4.0 - 10.5 K/uL 5.2 3.7(L) 2.8(L)  Hemoglobin 12.0 - 15.0 g/dL 10.4(L) 11.1(L) 9.7(L)  Hematocrit 36.0 - 46.0 % 31.9(L) 33.6(L) 30.1(L)  Platelets 150 - 400 K/uL 128(L) 98(L) 61(L)    Lab Results  Component Value Date   TSH 2.525 07/15/2017    ==================================================  COVID-19 Education: The signs and symptoms of COVID-19 were discussed with the patient and how to seek care for testing (follow up with PCP or arrange E-visit).    I spent a total of 35minutes with the patient spent in direct patient consultation.  Additional time spent with chart review  / charting (studies, outside notes, etc): 10 min Total Time: 45 min  Current medicines are reviewed at length with the patient today.  (+/- concerns) none  This visit occurred during the SARS-CoV-2 public health emergency.  Safety protocols were in place, including screening questions prior  to the visit, additional usage of staff PPE, and extensive cleaning of exam room while observing appropriate contact time as indicated for disinfecting solutions.  Notice: This dictation was prepared with Dragon dictation along with smaller phrase technology. Any transcriptional errors that result from this process are unintentional and may not be corrected upon review.  Patient Instructions /  Medication Changes & Studies & Tests Ordered   Shared Decision Making/Informed Consent The risks [chest pain, shortness of breath, cardiac arrhythmias, dizziness, blood pressure fluctuations, myocardial infarction, stroke/transient ischemic attack, nausea, vomiting, allergic reaction, radiation exposure, metallic taste sensation and life-threatening complications (estimated to be 1 in 10,000)], benefits (risk stratification, diagnosing coronary artery disease, treatment guidance) and alternatives of a nuclear stress test were discussed in detail with Ms. Cowans and she agrees to proceed.    Patient Instructions  Medication Instructions:  Imdur - Isosorbide mono 30 mg daily -- take 30 min after Aspirin 81 mg daily   *If you need a refill on your cardiac medications before your next appointment, please call your pharmacy*   Lab Work: Lipid and CMP  - fasting       Testing/Procedures: Your physician has requested that you have a lexiscan myoview. Please follow instruction sheet, as given.   Follow-Up: At [facility], you and your health needs are our priority.  As part of our continuing mission to provide you with exceptional heart care, we have created designated Provider Care Teams.  These Care Teams include your primary Cardiologist (physician) and Advanced Practice Providers (APPs -  Physician Assistants and Nurse Practitioners) who all work together to provide you with the care you need, when you need it.     Your next appointment:   1 month(s)  The format for your next appointment:   In Person  Provider:   Bryan Lemma, MD     Studies Ordered:   Orders Placed This Encounter  Procedures  . Lipid panel  . Comprehensive metabolic panel  . Cardiac Stress Test: Informed Consent Details: Physician/Practitioner Attestation; Transcribe to consent form and obtain patient signature  . MYOCARDIAL PERFUSION IMAGING  . EKG 12-Lead     Bryan Lemma, M.D.,  M.S. Interventional Cardiologist   Pager # [phone] Phone # [phone] [address] [place], [place] [address]   Thank you for choosing Heartcare at [facility]!!

## 2021-02-25 NOTE — Assessment & Plan Note (Signed)
9 years out from MI.  This was somewhat surprising because she clearly has classic breast pains.  However this time she presented with ACS and was found to have a's significant LAD lesion.  As such, I do not want to disregard her chest pain episodes.  Normal preserved EF.  Unfortunately she has had perpetual chest discomfort from before and after the MI.  Difficult to determine symptoms.

## 2021-02-25 NOTE — Assessment & Plan Note (Signed)
CBC she essentially only has single-vessel disease with a 99% LAD lesion treated with DES stent in the setting of non-STEMI. She is now having more frequent episodes of chest pain-difficult to tell if this is true anginal symptoms with or reproducible costochondritis..  Plan:  Continue aspirin 81 mg along with current dose of rosuvastatin 40 mg  On very low-dose beta-blocker.  We will add Imdur 30 mg for possible anginal relief.

## 2021-02-25 NOTE — Assessment & Plan Note (Signed)
She continues to have this exertional chest discomfort.  Based on the confusing history, it is not unreasonable to evaluate for ischemia.  Last stress test was in 2017.  Plan:  Lexiscan Myoview  Add Imdur to the low-dose metoprolol.

## 2021-02-25 NOTE — Assessment & Plan Note (Signed)
Labs well-controlled as of last June.  Due for follow-up labs now.  Continue on rosuvastatin.  Plan to check labs when she comes in for her Myoview.

## 2021-02-25 NOTE — Patient Instructions (Addendum)
Medication Instructions:  Imdur - Isosorbide mono 30 mg daily -- take 30 min after Aspirin 81 mg daily   *If you need a refill on your cardiac medications before your next appointment, please call your pharmacy*   Lab Work: Lipid and CMP  - fasting       Testing/Procedures: Your physician has requested that you have a lexiscan myoview. Please follow instruction sheet, as given.   Follow-Up: At [facility], you and your health needs are our priority.  As part of our continuing mission to provide you with exceptional heart care, we have created designated Provider Care Teams.  These Care Teams include your primary Cardiologist (physician) and Advanced Practice Providers (APPs -  Physician Assistants and Nurse Practitioners) who all work together to provide you with the care you need, when you need it.     Your next appointment:   1 month(s)  The format for your next appointment:   In Person  Provider:   Bryan Lemma, MD

## 2021-02-28 ENCOUNTER — Telehealth: Payer: Self-pay | Admitting: *Deleted

## 2021-02-28 NOTE — Telephone Encounter (Signed)
-----   Message from Marykay Lex, MD sent at 02/27/2021 12:06 AM EDT ----- Cholesterol levels look better.  Total cholesterol is down to 110 and LDL is down to 58.  This goes along with having the HDL reduced, but the too bad cholesterol levels are definitely improved. Continue current cholesterol medications. Liver function tests are little bit elevated, but not too far out of the realm of normal.  Kidney function is relatively normal, but would suggest that she is somewhat dehydrated.  Recommend increasing fluid hydration.  (Roughly 80 ounces of water a day).  Fasting blood glucose level is getting a little elevated.  Next lab check we should check A1c.  Bryan Lemma, MD

## 2021-02-28 NOTE — Telephone Encounter (Signed)
CALLED  NO ANSWER , VOICE HAS NOT BEEN SET UP , WILL TRY AGAIN

## 2021-03-05 ENCOUNTER — Telehealth (HOSPITAL_COMMUNITY): Payer: Self-pay | Admitting: *Deleted

## 2021-03-05 NOTE — Telephone Encounter (Signed)
Close encounter 

## 2021-03-06 ENCOUNTER — Other Ambulatory Visit: Payer: Self-pay

## 2021-03-06 ENCOUNTER — Encounter (HOSPITAL_COMMUNITY): Payer: Self-pay | Admitting: *Deleted

## 2021-03-06 ENCOUNTER — Ambulatory Visit (HOSPITAL_COMMUNITY)
Admission: RE | Admit: 2021-03-06 | Discharge: 2021-03-06 | Disposition: A | Discharge status: Home / Self Care | Payer: Medicare Other | Source: Ambulatory Visit | Attending: Cardiovascular Disease | Admitting: Cardiovascular Disease

## 2021-03-06 DIAGNOSIS — R072 Precordial pain: Secondary | ICD-10-CM

## 2021-03-06 DIAGNOSIS — R079 Chest pain, unspecified: Secondary | ICD-10-CM | POA: Diagnosis not present

## 2021-03-06 DIAGNOSIS — I2510 Atherosclerotic heart disease of native coronary artery without angina pectoris: Secondary | ICD-10-CM

## 2021-03-06 DIAGNOSIS — Z9861 Coronary angioplasty status: Secondary | ICD-10-CM | POA: Diagnosis not present

## 2021-03-06 DIAGNOSIS — I214 Non-ST elevation (NSTEMI) myocardial infarction: Secondary | ICD-10-CM | POA: Diagnosis not present

## 2021-03-06 HISTORY — PX: NM MYOVIEW LTD: HXRAD82

## 2021-03-06 LAB — MYOCARDIAL PERFUSION IMAGING
LV dias vol: 71 mL (ref 46–106)
LV sys vol: 29 mL
Peak HR: 91 {beats}/min
Rest HR: 61 {beats}/min
SDS: 1
SRS: 0
SSS: 1
TID: 1.16

## 2021-03-06 MED ORDER — TECHNETIUM TC 99M TETROFOSMIN IV KIT
30.2000 | PACK | Freq: Once | INTRAVENOUS | Status: AC | PRN
  Administered 2021-03-06: 30.2 via INTRAVENOUS
  Filled 2021-03-06: qty 31

## 2021-03-06 MED ORDER — REGADENOSON 0.4 MG/5ML IV SOLN
0.4000 mg | Freq: Once | INTRAVENOUS | Status: AC
  Administered 2021-03-06: 0.4 mg via INTRAVENOUS

## 2021-03-06 MED ORDER — TECHNETIUM TC 99M TETROFOSMIN IV KIT
10.7000 | PACK | Freq: Once | INTRAVENOUS | Status: AC | PRN
  Administered 2021-03-06: 10.7 via INTRAVENOUS
  Filled 2021-03-06: qty 11

## 2021-03-06 NOTE — Progress Notes (Signed)
Ybion Mlo, [facility] Interpreter provided interpretation services during a Nuc MPI study done today for this pt.

## 2021-03-06 NOTE — Progress Notes (Signed)
Stress Test looked good!! No sign of significant Heart Artery Disease.  Pump function is normal.  LOW RISK. No sign of prior large heart attack or new significant blockages to explain chest pain.  Most likely, chest pain is musculoskeletal.  Good news!!.  Marykay Lex, MD

## 2021-03-17 ENCOUNTER — Encounter: Payer: Self-pay | Admitting: Cardiology

## 2021-03-17 ENCOUNTER — Ambulatory Visit (INDEPENDENT_AMBULATORY_CARE_PROVIDER_SITE_OTHER): Payer: Medicare Other | Admitting: Cardiology

## 2021-03-17 ENCOUNTER — Other Ambulatory Visit: Payer: Self-pay

## 2021-03-17 VITALS — BP 115/60 | HR 66 | Ht 59.0 in | Wt 134.2 lb

## 2021-03-17 DIAGNOSIS — I5032 Chronic diastolic (congestive) heart failure: Secondary | ICD-10-CM

## 2021-03-17 DIAGNOSIS — M791 Myalgia, unspecified site: Secondary | ICD-10-CM | POA: Diagnosis not present

## 2021-03-17 DIAGNOSIS — T466X5A Adverse effect of antihyperlipidemic and antiarteriosclerotic drugs, initial encounter: Secondary | ICD-10-CM | POA: Diagnosis not present

## 2021-03-17 DIAGNOSIS — I2510 Atherosclerotic heart disease of native coronary artery without angina pectoris: Secondary | ICD-10-CM | POA: Diagnosis not present

## 2021-03-17 DIAGNOSIS — Z9861 Coronary angioplasty status: Secondary | ICD-10-CM

## 2021-03-17 DIAGNOSIS — R072 Precordial pain: Secondary | ICD-10-CM | POA: Diagnosis not present

## 2021-03-17 DIAGNOSIS — E785 Hyperlipidemia, unspecified: Secondary | ICD-10-CM | POA: Diagnosis not present

## 2021-03-17 DIAGNOSIS — I214 Non-ST elevation (NSTEMI) myocardial infarction: Secondary | ICD-10-CM

## 2021-03-17 MED ORDER — ISOSORBIDE MONONITRATE ER 60 MG PO TB24: 60.0000 mg | ORAL_TABLET | Freq: Every day | ORAL | 3 refills | Status: DC

## 2021-03-17 MED ORDER — METOPROLOL SUCCINATE ER 25 MG PO TB24: 12.5000 mg | ORAL_TABLET | Freq: Every day | ORAL | 3 refills | Status: DC

## 2021-03-17 NOTE — Progress Notes (Signed)
Primary Care Provider: Fleet Contras, MD Cardiologist: Bryan Lemma, MD Electrophysiologist: None  Clinic Note: Chief Complaint  Patient presents with   Follow-up    1 month follow-up stress test results   Coronary Artery Disease    Has both MSK type pain as well as clearly had a non-STEMI-Myoview was nonischemic   Fatigue     ===================================  ASSESSMENT/PLAN   Problem List Items Addressed This Visit     CAD S/P percutaneous coronary angioplasty - DES to LAD April 2013 - Primary (Chronic)    Single-vessel disease with 3 lesions in the LAD treated with DES stent in the setting of non-STEMI which was a very unusual situation.  She had been having off-and-on MSK type chest pain with nonischemic evaluation and then came in with clear angina symptoms, EKG changes and referred from the clinic to the hospital for non-STEMI.  At that time she had LAD PCI and since then has still been having these strange episodes of chest pain which are probably most likely reproducible costochondritis symptoms but also could be microvascular stress related symptoms.  She does not have a lot of blood pressure room and therefore limits her ability to treat.  Plan: Continue aspirin. Continue beta-blocker, will convert from Lopressor 12.5 mg twice daily to Toprol 12.5 mg because of fatigue She has been on statin and doing well, but with ongoing symptoms of fatigue and leg aches, we will do a 1 month statin holiday and reassess.  If symptoms have not improved, then I want to go back on full dose statin.  If if symptoms do improve, she should reduce to 20 mg dosing.       Relevant Medications   metoprolol succinate (TOPROL XL) 25 MG 24 hr tablet   isosorbide mononitrate (IMDUR) 60 MG 24 hr tablet   NSTEMI, April 2013 (Chronic)    Now more than 9 years out from her MI, and she still has different types of chest discomfort.  Has had at least 2-3 stress test done since then that are  nonischemic.  No evidence of significant infarct noted on Myoview with no significant wall motion normality on echo.       Relevant Medications   metoprolol succinate (TOPROL XL) 25 MG 24 hr tablet   isosorbide mononitrate (IMDUR) 60 MG 24 hr tablet   Hyperlipidemia with target LDL less than 70 (Chronic)    Lipids are pretty well controlled by recent check.  However little worried better fatigue and leg aching.  We will plan for a 1 month statin holiday and then if symptoms improve, will reduce dose of rosuvastatin to 20 mg.  (May need to then add Zetia)       Relevant Medications   metoprolol succinate (TOPROL XL) 25 MG 24 hr tablet   isosorbide mononitrate (IMDUR) 60 MG 24 hr tablet   Precordial pain    Since then really cannot tell what her chest pain truly is and she is still using nitroglycerin off-and-on my plan is to increase her Imdur to 60 mg daily.  I am already reducing beta-blocker dose because of fatigue down to 12.5 mg (converting from 12.5 mg twice daily Lopressor to 12.5 mg daily Toprol)  Plan: Increase Imdur to 60 mg daily.       Myalgia due to statin    Started to have some leg pain issues as well as fatigue.  We will try a statin holiday to see if this improves.  See above.  Chronic diastolic CHF (congestive heart failure) (HCC) (Chronic)    Not really having active heart failure symptoms.  Euvolemic on exam.  Has had relatively low blood pressures and therefore were not limited as far as afterload reduction. XL plan: Increase Imdur to 60 mg daily.  Reducing beta-blocker dose to Toprol 12.5 mg daily.       Relevant Medications   metoprolol succinate (TOPROL XL) 25 MG 24 hr tablet   isosorbide mononitrate (IMDUR) 60 MG 24 hr tablet    ===================================  HPI:    Tammy Murillo is a 75 y.o. female with a PMH notable for CAD-non-STEMI 01/16/2012 (LAD PCI) along with significant MSK Chest Pain/Costochondritis who presents today for 1 month  follow-up to discuss results of Myoview stress test.  Tammy Murillo was last seen on 02/25/2021 for annual follow-up (as usual accompanied by her daughter who she prefers as the interpreter, has declined the use of contracted interpreters, stating dialect issues).  She indicated that she was not doing very well of late.  Very upset about having more frequent chest pain episodes with or without exertion.  Often happens when she gets anxious or excited.  Has had to take nitroglycerin off and on still.  Some days can go without symptoms some days have multiple symptoms.  She also noted feeling more generally tired than usual. -> For clarity, we ordered a Myoview.  Recent Hospitalizations: None  Reviewed  CV studies:    The following studies were reviewed today: (if available, images/films reviewed: From Epic Chart or Care Everywhere) Lexiscan Myoview 03/06/2021: EF 55 to 60%.  No ischemia or infarction.  Normal wall motion.  LOW RISK.  Interval History:   Tammy Murillo returns here today again with her daughter providing history.  She is still complaining of this chest discomfort and occasionally takes her nitro off and on.  Again can happen with or without rest.  She is noting general fatigue and some leg aching, but not claudication.  She still feeling tired more than usual, but is as active as she usually wants to be..  She seems to be slowing down a little bit however.  No complaints of CHF or arrhythmia symptoms.  CV Review of Symptoms (Summary) Cardiovascular ROS: positive for - chest pain and this is pretty much the same thing she has had off-and-on very difficult determine between MSK pain and anginal pain.  Has fatigue and anxiety.  Legs ache-more at rest than with exertion.. negative for - dyspnea on exertion, edema, irregular heartbeat, orthopnea, palpitations, paroxysmal nocturnal dyspnea, rapid heart rate, shortness of breath, or lightheadedness or dizziness or wooziness, syncope/near syncope,  TIA/amaurosis fugax, claudication  REVIEWED OF SYSTEMS   Review of Systems  Constitutional:  Positive for malaise/fatigue. Negative for weight loss.  HENT:  Negative for congestion and nosebleeds.   Respiratory:  Negative for shortness of breath.   Cardiovascular:        Per HPI  Gastrointestinal:  Negative for blood in stool and melena.  Genitourinary:  Negative for hematuria.  Musculoskeletal:  Positive for myalgias (Legs ache at rest). Negative for falls and joint pain.  Neurological:  Positive for weakness (Feels generally weak). Negative for dizziness, focal weakness and headaches.  Psychiatric/Behavioral:  Negative for depression and memory loss. The patient is nervous/anxious. The patient does not have insomnia.    I have reviewed and (if needed) personally updated the patient's problem list, medications, allergies, past medical and surgical history, social and family history.  PAST MEDICAL HISTORY   Past Medical History:  Diagnosis Date   Anxiety    Borderline hypertension    CAD S/P percutaneous coronary angioplasty 12/31/2011   3 sequential LAD lesions treated with a single Promus Element DES; - Myoview 02/2016: No infarct or ischemia. EF 60-65%.   Costochondritis    Dyslipidemia, goal LDL below 70    H/O echocardiogram April 2014   Normal EF: 55-60%, no regional WMA, grade 1 diastolic dysfunction. Mild MR   History of colon cancer  2010   History of: Non-ST elevation MI (NSTEMI) 12/31/2011   Severe LAD lesions: Sequential 90%, 70% and 60%; no other significant stenoses. She had had a normal Myoview in 2012 AS WELL AS POST-PCI IN 2017   Osteoarthritis    Presence of drug coated stent in LAD coronary artery 01/01/2012   Promus Element DES 2.75 mm x38 mm postdilated to 3.0 mm    PAST SURGICAL HISTORY   Past Surgical History:  Procedure Laterality Date   CHOLECYSTECTOMY  2004   LEFT HEART CATHETERIZATION WITH CORONARY ANGIOGRAM N/A 01/01/2012   Procedure: LEFT HEART  CATHETERIZATION WITH CORONARY ANGIOGRAM;  Surgeon: Lennette Bihari, MD;  Location: [facility] CATH LAB;  Service: Cardiovascular;  Laterality: N/A;   NM MYOVIEW LTD  02/2016    LOW RISK study. No ischemia or infarction. EF 60-65%.   NM MYOVIEW LTD  03/06/2021   Lexiscan Myoview: EF 55 to 60%.  No ischemia or infarction.  Normal wall motion.  LOW RISK.   PERCUTANEOUS CORONARY STENT INTERVENTION (PCI-S)  01/01/2012   Procedure: PERCUTANEOUS CORONARY STENT INTERVENTION (PCI-S);  Surgeon: Lennette Bihari, MD;  Location: [facility] CATH LAB;  Service: Cardiovascula: LAD PCI: Promus Element DES 2.75 MM by 30 MM, postdilated to 3.0 MM   TRANSTHORACIC ECHOCARDIOGRAM  06/2017   EF 55-60%. Grade 1 diastolic dysfunction. Mild MR. no RWMA     There is no immunization history on file for this patient.  MEDICATIONS/ALLERGIES   Current Meds  Medication Sig   acetaminophen (TYLENOL) 325 MG tablet Take 2 tablets (650 mg total) by mouth every 6 (six) hours as needed for mild pain or headache (or Fever >/= 101).   aspirin EC 81 MG tablet Take 1 tablet (81 mg total) by mouth daily.   diclofenac Sodium (VOLTAREN) 1 % GEL Apply 1 application topically 4 (four) times daily.   esomeprazole (NEXIUM) 40 MG capsule TAKE 1 CAPSULE BY MOUTH EVERY DAY BEFORE BREAKFAST   isosorbide mononitrate (IMDUR) 60 MG 24 hr tablet Take 1 tablet (60 mg total) by mouth daily.   methylPREDNISolone (MEDROL DOSEPAK) 4 MG TBPK tablet    metoprolol succinate (TOPROL XL) 25 MG 24 hr tablet Take 0.5 tablets (12.5 mg total) by mouth daily.   nitroGLYCERIN (NITROSTAT) 0.4 MG SL tablet TAKE 1 TABLET UNDER THE TONGUE, EVERY 5 MINUETS FOR 3 DOSES, AS NEEDED FOR CHEST PAIN AS DIRECTED   ondansetron (ZOFRAN ODT) 8 MG disintegrating tablet Take 1 tablet (8 mg total) by mouth every 8 (eight) hours as needed for nausea or vomiting.   rosuvastatin (CRESTOR) 40 MG tablet TAKE 1 TABLET BY MOUTH EVERY DAY   VOLTAREN 1 % GEL Apply 1 application topically 4 (four) times  daily as needed (for pain).    [DISCONTINUED] isosorbide mononitrate (IMDUR) 30 MG 24 hr tablet Take 1 tablet (30 mg total) by mouth daily. Take 30 minutes after taking 81 mg Aspirin   [DISCONTINUED] metoprolol tartrate (LOPRESSOR) 25 MG tablet Take 0.5 tablets (  12.5 mg total) by mouth 2 (two) times daily.    No Known Allergies  SOCIAL HISTORY/FAMILY HISTORY   Reviewed in Epic:  Pertinent findings:  Social History   Tobacco Use   Smoking status: Never   Smokeless tobacco: Never  Vaping Use   Vaping Use: Never used  Substance Use Topics   Alcohol use: No   Drug use: No   Social History   Social History Narrative   She is a native [place] woman. She is married with 5 children and 7 grandchildren. One of her daughters or her granddaughters usually comes as an Equities trader. She does not speak any [place].   The granddaughter with her today notes that the patient is significantly prone to anxiety and stress related to family issues. Most of her symptoms are related to stressful situations in house.   She does not smoke or drink. She does not routinely exercise.    OBJCTIVE -PE, EKG, labs   Wt Readings from Last 3 Encounters:  03/17/21 134 lb 3.2 oz (60.9 kg)  03/06/21 132 lb (59.9 kg)  02/25/21 132 lb 3.2 oz (60 kg)    Physical Exam: BP 115/60   Pulse 66   Ht 4\' 11"  (1.499 m)   Wt 134 lb 3.2 oz (60.9 kg)   SpO2 97%   BMI 27.11 kg/m  Physical Exam Vitals reviewed.  Constitutional:      General: She is not in acute distress.    Appearance: Normal appearance. She is normal weight. She is not ill-appearing or toxic-appearing.  Neck:     Vascular: Carotid bruit (Probably radiated aortic murmur, cannot exclude bruit) present. No JVD.  Cardiovascular:     Rate and Rhythm: Normal rate and regular rhythm. No extrasystoles are present.    Chest Wall: PMI is not displaced.     Pulses: Normal pulses.     Heart sounds: S1 normal and S2 normal. Murmur (1/6 SEM at RUSB) heard.     No friction rub. No gallop.  Pulmonary:     Effort: Pulmonary effort is normal. No respiratory distress.     Breath sounds: Normal breath sounds.  Chest:     Chest wall: Tenderness (Still has costosternal discomfort) present.  Musculoskeletal:        General: No swelling. Normal range of motion.     Cervical back: Normal range of motion and neck supple.  Skin:    General: Skin is warm and dry.  Neurological:     General: No focal deficit present.     Mental Status: She is alert and oriented to person, place, and time.  Psychiatric:        Behavior: Behavior normal.        Judgment: Judgment normal.     Comments: Difficult to assess since everything is through an interpreter, she seems to be in normal mood.     Adult ECG Report Not checked  Recent Labs: Reviewed Lab Results  Component Value Date   CHOL 110 02/25/2021   HDL 33 (L) 02/25/2021   LDLCALC 58 02/25/2021   TRIG 99 02/25/2021   CHOLHDL 3.3 02/25/2021   Lab Results  Component Value Date   CREATININE 0.60 02/25/2021   BUN 23 02/25/2021   NA 142 02/25/2021   K 4.4 02/25/2021   CL 104 02/25/2021   CO2 22 02/25/2021   CBC Latest Ref Rng & Units 07/17/2017 07/16/2017 07/15/2017  WBC 4.0 - 10.5 K/uL 5.2 3.7(L) 2.8(L)  Hemoglobin 12.0 - 15.0 g/dL  10.4(L) 11.1(L) 9.7(L)  Hematocrit 36.0 - 46.0 % 31.9(L) 33.6(L) 30.1(L)  Platelets 150 - 400 K/uL 128(L) 98(L) 61(L)    Lab Results  Component Value Date   TSH 2.525 07/15/2017    ==================================================  COVID-19 Education: The signs and symptoms of COVID-19 were discussed with the patient and how to seek care for testing (follow up with PCP or arrange E-visit).     This visit occurred during the SARS-CoV-2 public health emergency.  Safety protocols were in place, including screening questions prior to the visit, additional usage of staff PPE, and extensive cleaning of exam room while observing appropriate contact time as indicated  for disinfecting solutions.  Notice: This dictation was prepared with Dragon dictation along with smaller phrase technology. Any transcriptional errors that result from this process are unintentional and may not be corrected upon review.  Patient Instructions / Medication Changes & Studies & Tests Ordered   Patient Instructions  Medication Instructions:  INCREASE  IMDUR (ISOSORBIDE MONO) 60 MG  DAILY   STOP METOPROLOL TARTRATE 12.5 MG TWICE A DAY  START METOPROLOL SUCCINATE 12.5 MG  ( 1/2 TABLET OF 25 MG) DAILY   FOR ONE MONTH DO NOT TAKE ROSUVASTATIN 40 MG ,  AFTER July 21,2022 - IF YOU FEEL BETTER  REDUCE MEDICATION TO 20 MG ( 1/2 TABLET DAILY)  ,OR IF THERE IS NO CHANGE  STAY WITH THE 40 MG DAILY   *If you need a refill on your cardiac medications before your next appointment, please call your pharmacy*   Lab Work: NOT NEEDED If you have labs (blood work) drawn today and your tests are completely normal, you will receive your results only by: MyChart Message (if you have MyChart) OR A paper copy in the mail If you have any lab test that is abnormal or we need to change your treatment, we will call you to review the results.   Testing/Procedures: NOT NEEDED   Follow-Up: At [facility], you and your health needs are our priority.  As part of our continuing mission to provide you with exceptional heart care, we have created designated Provider Care Teams.  These Care Teams include your primary Cardiologist (physician) and Advanced Practice Providers (APPs -  Physician Assistants and Nurse Practitioners) who all work together to provide you with the care you need, when you need it.  We recommend signing up for the patient portal called "MyChart".  Sign up information is provided on this After Visit Summary.  MyChart is used to connect with patients for Virtual Visits (Telemedicine).  Patients are able to view lab/test results, encounter notes, upcoming appointments, etc.   Non-urgent messages can be sent to your provider as well.   To learn more about what you can do with MyChart, go to [email or web address].    Your next appointment:   7 month(s)  The format for your next appointment:   In Person  Provider:   Bryan Lemma, MD     Studies Ordered:   No orders of the defined types were placed in this encounter.    Bryan Lemma, M.D., M.S. Interventional Cardiologist   Pager # [phone] Phone # [phone] [address] [place], [place] [address]   Thank you for choosing Heartcare at [facility]!!

## 2021-03-17 NOTE — Patient Instructions (Addendum)
Medication Instructions:  INCREASE  IMDUR (ISOSORBIDE MONO) 60 MG  DAILY   STOP METOPROLOL TARTRATE 12.5 MG TWICE A DAY  START METOPROLOL SUCCINATE 12.5 MG  ( 1/2 TABLET OF 25 MG) DAILY   FOR ONE MONTH DO NOT TAKE ROSUVASTATIN 40 MG ,  AFTER July 21,2022 - IF YOU FEEL BETTER  REDUCE MEDICATION TO 20 MG ( 1/2 TABLET DAILY)  ,OR IF THERE IS NO CHANGE  STAY WITH THE 40 MG DAILY   *If you need a refill on your cardiac medications before your next appointment, please call your pharmacy*   Lab Work: NOT NEEDED If you have labs (blood work) drawn today and your tests are completely normal, you will receive your results only by: MyChart Message (if you have MyChart) OR A paper copy in the mail If you have any lab test that is abnormal or we need to change your treatment, we will call you to review the results.   Testing/Procedures: NOT NEEDED   Follow-Up: At [facility], you and your health needs are our priority.  As part of our continuing mission to provide you with exceptional heart care, we have created designated Provider Care Teams.  These Care Teams include your primary Cardiologist (physician) and Advanced Practice Providers (APPs -  Physician Assistants and Nurse Practitioners) who all work together to provide you with the care you need, when you need it.  We recommend signing up for the patient portal called "MyChart".  Sign up information is provided on this After Visit Summary.  MyChart is used to connect with patients for Virtual Visits (Telemedicine).  Patients are able to view lab/test results, encounter notes, upcoming appointments, etc.  Non-urgent messages can be sent to your provider as well.   To learn more about what you can do with MyChart, go to [email or web address].    Your next appointment:   7 month(s)  The format for your next appointment:   In Person  Provider:   Bryan Lemma, MD

## 2021-03-20 ENCOUNTER — Encounter: Payer: Self-pay | Admitting: Cardiology

## 2021-03-20 NOTE — Assessment & Plan Note (Signed)
Now more than 9 years out from her MI, and she still has different types of chest discomfort.  Has had at least 2-3 stress test done since then that are nonischemic.  No evidence of significant infarct noted on Myoview with no significant wall motion normality on echo.

## 2021-03-20 NOTE — Assessment & Plan Note (Signed)
Started to have some leg pain issues as well as fatigue.  We will try a statin holiday to see if this improves.  See above.

## 2021-03-20 NOTE — Assessment & Plan Note (Signed)
Since then really cannot tell what her chest pain truly is and she is still using nitroglycerin off-and-on my plan is to increase her Imdur to 60 mg daily.  I am already reducing beta-blocker dose because of fatigue down to 12.5 mg (converting from 12.5 mg twice daily Lopressor to 12.5 mg daily Toprol)  Plan: Increase Imdur to 60 mg daily.

## 2021-03-20 NOTE — Assessment & Plan Note (Signed)
Not really having active heart failure symptoms.  Euvolemic on exam.  Has had relatively low blood pressures and therefore were not limited as far as afterload reduction. XL plan: Increase Imdur to 60 mg daily.  Reducing beta-blocker dose to Toprol 12.5 mg daily.

## 2021-03-20 NOTE — Assessment & Plan Note (Addendum)
Single-vessel disease with 3 lesions in the LAD treated with DES stent in the setting of non-STEMI which was a very unusual situation.  She had been having off-and-on MSK type chest pain with nonischemic evaluation and then came in with clear angina symptoms, EKG changes and referred from the clinic to the hospital for non-STEMI.  At that time she had LAD PCI and since then has still been having these strange episodes of chest pain which are probably most likely reproducible costochondritis symptoms but also could be microvascular stress related symptoms.  She does not have a lot of blood pressure room and therefore limits her ability to treat.  Plan: Continue aspirin.  Continue beta-blocker, will convert from Lopressor 12.5 mg twice daily to Toprol 12.5 mg because of fatigue  She has been on statin and doing well, but with ongoing symptoms of fatigue and leg aches, we will do a 1 month statin holiday and reassess.  If symptoms have not improved, then I want to go back on full dose statin.  If if symptoms do improve, she should reduce to 20 mg dosing.

## 2021-03-20 NOTE — Assessment & Plan Note (Signed)
Lipids are pretty well controlled by recent check.  However little worried better fatigue and leg aching.  We will plan for a 1 month statin holiday and then if symptoms improve, will reduce dose of rosuvastatin to 20 mg.  (May need to then add Zetia)

## 2021-03-25 ENCOUNTER — Ambulatory Visit: Payer: Medicare Other | Admitting: Cardiology

## 2021-05-18 ENCOUNTER — Other Ambulatory Visit: Payer: Self-pay | Admitting: Cardiology

## 2021-08-15 ENCOUNTER — Other Ambulatory Visit: Payer: Self-pay | Admitting: Cardiology

## 2021-09-25 ENCOUNTER — Telehealth: Payer: Self-pay | Admitting: Cardiology

## 2021-09-25 NOTE — Telephone Encounter (Signed)
Spoke to  patient daughter Deveron Furlong. She states patient had chest discomfort about  2 days ago.Deveron Furlong states patient  had use  NTG  sublingual x 1.  Patient was relieved  but came back about one hour later per daughter she did not take anymore NTG. Patient not having any discomfort at present time. RN informed daughter to start taking 120 mg  isosorbide in morning from 60 mg in the morning.  Deveron Furlong states understanding aware will defer to Dr Herbie Baltimore for further instruction  Appointment schedule for 10/27/21 at 4:30 pm

## 2021-09-25 NOTE — Telephone Encounter (Signed)
Pt c/o of Chest Pain: STAT if CP now or developed within 24 hours  1. Are you having CP right now? no  2. Are you experiencing any other symptoms (ex. SOB, nausea, vomiting, sweating)? no  3. How long have you been experiencing CP? 2 days ago, came again this morning  4. Is your CP continuous or coming and going? Comes and goes  5. Have you taken Nitroglycerin? No   Patient's daughter states the patient has been having chest pains. She states the patient takes the isosorbide, metoprolol, esomeprazole, rosuvastatin, and aspirin. She states the patient is not having chest pain now and has no other symptoms.

## 2021-09-26 NOTE — Telephone Encounter (Cosign Needed)
Spoke to patient's daughter. Per Dr Lillie Fragmin  120 mg Isosorbide until Monday 09/29/21 then return to  60 mg daily.  Chest pain  reoccurs - use NTG up to 3 tablets  5 min apart. No relief call 911 go t ER  Keep appointment 10/27/21   Verbalized understanding

## 2021-09-26 NOTE — Telephone Encounter (Signed)
If not  having CP any more, agee with higher Imdur dose for a few days.   If recurrent CP with activity that goes away with rest, would probably need to be evaluated to consider ST vs. Cath.  Bryan Lemma, MD

## 2021-10-27 ENCOUNTER — Encounter: Payer: Self-pay | Admitting: Cardiology

## 2021-10-27 ENCOUNTER — Other Ambulatory Visit: Payer: Self-pay

## 2021-10-27 ENCOUNTER — Ambulatory Visit (INDEPENDENT_AMBULATORY_CARE_PROVIDER_SITE_OTHER): Payer: Medicare Other | Admitting: Cardiology

## 2021-10-27 VITALS — BP 142/64 | HR 63 | Ht 59.0 in | Wt 134.0 lb

## 2021-10-27 DIAGNOSIS — Z9861 Coronary angioplasty status: Secondary | ICD-10-CM | POA: Diagnosis not present

## 2021-10-27 DIAGNOSIS — R42 Dizziness and giddiness: Secondary | ICD-10-CM | POA: Insufficient documentation

## 2021-10-27 DIAGNOSIS — I2510 Atherosclerotic heart disease of native coronary artery without angina pectoris: Secondary | ICD-10-CM

## 2021-10-27 DIAGNOSIS — E785 Hyperlipidemia, unspecified: Secondary | ICD-10-CM | POA: Diagnosis not present

## 2021-10-27 DIAGNOSIS — R072 Precordial pain: Secondary | ICD-10-CM

## 2021-10-27 DIAGNOSIS — I214 Non-ST elevation (NSTEMI) myocardial infarction: Secondary | ICD-10-CM

## 2021-10-27 NOTE — Progress Notes (Incomplete)
Primary Care Provider: Nolene Ebbs, MD Cardiologist: Glenetta Hew, MD Electrophysiologist: None  Clinic Note: Chief Complaint  Patient presents with   Follow-up   Chest Pain   Fatigue   Headache    ===================================  ASSESSMENT/PLAN   Problem List Items Addressed This Visit   None  ===================================  HPI:    Tammy Murillo is a 76 y.o. female with a PMH below who presents today for ***.  Alianis T Sam was last seen on ***  Recent Hospitalizations: ***  Reviewed  CV studies:    The following studies were reviewed today: (if available, images/films reviewed: From Epic Chart or Care Everywhere) ***:   Interval History:   Tammy Murillo   CV Review of Symptoms (Summary) Cardiovascular ROS: {roscv:310661}  REVIEWED OF SYSTEMS   ROS  I have reviewed and (if needed) personally updated the patient's problem list, medications, allergies, past medical and surgical history, social and family history.   PAST MEDICAL HISTORY   Past Medical History:  Diagnosis Date   Anxiety    Borderline hypertension    CAD S/P percutaneous coronary angioplasty 12/31/2011   3 sequential LAD lesions treated with a single Promus Element DES; - Myoview 02/2016: No infarct or ischemia. EF 60-65%.   Costochondritis    Dyslipidemia, goal LDL below 70    H/O echocardiogram April 2014   Normal EF: 55-60%, no regional WMA, grade 1 diastolic dysfunction. Mild MR   History of colon cancer  2010   History of: Non-ST elevation MI (NSTEMI) 12/31/2011   Severe LAD lesions: Sequential 90%, 70% and 60%; no other significant stenoses. She had had a normal Myoview in 2012 AS WELL AS POST-PCI IN 2017   Osteoarthritis    Presence of drug coated stent in LAD coronary artery 01/01/2012   Promus Element DES 2.75 mm x38 mm postdilated to 3.0 mm    PAST SURGICAL HISTORY   Past Surgical History:  Procedure Laterality Date   CHOLECYSTECTOMY  2004    LEFT HEART CATHETERIZATION WITH CORONARY ANGIOGRAM N/A 01/01/2012   Procedure: LEFT HEART CATHETERIZATION WITH CORONARY ANGIOGRAM;  Surgeon: Troy Sine, MD;  Location: [facility] CATH LAB;  Service: Cardiovascular;  Laterality: N/A;   NM MYOVIEW LTD  02/2016    LOW RISK study. No ischemia or infarction. EF 60-65%.   NM MYOVIEW LTD  03/06/2021   Lexiscan Myoview: EF 55 to 60%.  No ischemia or infarction.  Normal wall motion.  LOW RISK.   PERCUTANEOUS CORONARY STENT INTERVENTION (PCI-S)  01/01/2012   Procedure: PERCUTANEOUS CORONARY STENT INTERVENTION (PCI-S);  Surgeon: Troy Sine, MD;  Location: [facility] C[facility] LAB;  Service: Cardiovascula: LAD PCI: Promus Element DES 2.75 MM by 30 MM, postdilated to 3.0 MM   TRANSTHORACIC ECHOCARDIOGRAM  06/2017   EF 55-60%. Grade 1 diastolic dysfunction. Mild MR. no RWMA     There is no immunization history on file for this patient.  MEDICATIONS/ALLERGIES   Current Meds  Medication Sig   acetaminophen (TYLENOL) 325 MG tablet Take 2 tablets (650 mg total) by mouth every 6 (six) hours as needed for mild pain or headache (or Fever >/= 101).   aspirin EC 81 MG tablet Take 1 tablet (81 mg total) by mouth daily.   diclofenac Sodium (VOLTAREN) 1 % GEL Apply 1 application topically 4 (four) times daily.   esomeprazole (NEXIUM) 40 MG capsule TAKE 1 CAPSULE BY MOUTH EVERY DAY BEFORE BREAKFAST   isosorbide mononitrate (IMDUR) 60 MG 24 hr tablet Take  1 tablet (60 mg total) by mouth daily.   methylPREDNISolone (MEDROL DOSEPAK) 4 MG TBPK tablet    metoprolol succinate (TOPROL XL) 25 MG 24 hr tablet Take 0.5 tablets (12.5 mg total) by mouth daily.   nitroGLYCERIN (NITROSTAT) 0.4 MG SL tablet TAKE 1 TABLET UNDER THE TONGUE, EVERY 5 MINUETS FOR 3 DOSES, AS NEEDED FOR CHEST PAIN AS DIRECTED   ondansetron (ZOFRAN ODT) 8 MG disintegrating tablet Take 1 tablet (8 mg total) by mouth every 8 (eight) hours as needed for nausea or vomiting.   rosuvastatin (CRESTOR) 40 MG  tablet TAKE 1 TABLET BY MOUTH EVERY DAY   VOLTAREN 1 % GEL Apply 1 application topically 4 (four) times daily as needed (for pain).     No Known Allergies  SOCIAL HISTORY/FAMILY HISTORY   Reviewed in Epic:  Pertinent findings:  Social History   Tobacco Use   Smoking status: Never   Smokeless tobacco: Never  Vaping Use   Vaping Use: Never used  Substance Use Topics   Alcohol use: No   Drug use: No   Social History   Social History Narrative   She is a native [place] woman. She is married with 5 children and 7 grandchildren. One of her daughters or her granddaughters usually comes as an Astronomer. She does not speak any [place].   The granddaughter with her today notes that the patient is significantly prone to anxiety and stress related to family issues. Most of her symptoms are related to stressful situations in house.   She does not smoke or drink. She does not routinely exercise.    OBJCTIVE -PE, EKG, labs   Wt Readings from Last 3 Encounters:  10/27/21 134 lb (60.8 kg)  03/17/21 134 lb 3.2 oz (60.9 kg)  03/06/21 132 lb (59.9 kg)    Physical Exam: BP (!) 142/64 (BP Location: Right Arm, Patient Position: Sitting, Cuff Size: Normal)    Pulse 63    Ht 4\' 11"  (1.499 m)    Wt 134 lb (60.8 kg)    BMI 27.06 kg/m  Physical Exam   Adult ECG Report  Rate: *** ;  Rhythm: {rhythm:17366};   Narrative Interpretation: ***  Recent Labs:  ***  Lab Results  Component Value Date   CHOL 110 02/25/2021   HDL 33 (L) 02/25/2021   LDLCALC 58 02/25/2021   TRIG 99 02/25/2021   CHOLHDL 3.3 02/25/2021   Lab Results  Component Value Date   CREATININE 0.60 02/25/2021   BUN 23 02/25/2021   NA 142 02/25/2021   K 4.4 02/25/2021   CL 104 02/25/2021   CO2 22 02/25/2021   CBC Latest Ref Rng & Units 07/17/2017 07/16/2017 07/15/2017  WBC 4.0 - 10.5 K/uL 5.2 3.7(L) 2.8(L)  Hemoglobin 12.0 - 15.0 g/dL 10.4(L) 11.1(L) 9.7(L)  Hematocrit 36.0 - 46.0 % 31.9(L) 33.6(L) 30.1(L)   Platelets 150 - 400 K/uL 128(L) 98(L) 61(L)    No results found for: HGBA1C Lab Results  Component Value Date   TSH 2.525 07/15/2017    ==================================================  COVID-19 Education: The signs and symptoms of COVID-19 were discussed with the patient and how to seek care for testing (follow up with PCP or arrange E-visit).    I spent a total of ***minutes with the patient spent in direct patient consultation.  Additional time spent with chart review  / charting (studies, outside notes, etc): *** min Total Time: *** min  Current medicines are reviewed at length with the patient today.  (+/- concerns) ***  This visit occurred during the SARS-CoV-2 public health emergency.  Safety protocols were in place, including screening questions prior to the visit, additional usage of staff PPE, and extensive cleaning of exam room while observing appropriate contact time as indicated for disinfecting solutions.  Notice: This dictation was prepared with Dragon dictation along with smart phrase technology. Any transcriptional errors that result from this process are unintentional and may not be corrected upon review.  Studies Ordered:   No orders of the defined types were placed in this encounter.   Patient Instructions / Medication Changes & Studies & Tests Ordered   There are no Patient Instructions on file for this visit.     Glenetta Hew, M.D., M.S. Interventional Cardiologist   Pager # [phone] Phone # [phone] [address] [place], [place] [address]   Thank you for choosing Heartcare at [facility]!!

## 2021-10-27 NOTE — Progress Notes (Signed)
Primary Care Provider: Nolene Ebbs, MD Cardiologist: Glenetta Hew, MD Electrophysiologist: None  Clinic Note: Chief Complaint  Patient presents with   Follow-up    Delayed 6 months   Chest Pain   Fatigue   Headache   ===================================  ASSESSMENT/PLAN   Problem List Items Addressed This Visit       Cardiology Problems   CAD S/P  - DES to LAD April 2013 - Primary (Chronic)    Single-vessel disease of the LAD with 3 lesions treated with 1 stent.  No longer having any of that type of anginal type pain, still has musculoskeletal pain that has been evaluated on several occasions with negative biopsies.  Most recently last year.  She really does not actually have exertional chest tightness or pressure, most the time its at rest.  We are limited because of symptomatic low blood pressures and dizziness as far as titrating med Ications.  Plan: Unstable low-dose Toprol 12.5 mg daily.  Not able to titrate further because of heart rate of 63 dizziness. Continue current dose of Imdur--if she does have spells of chest pain, she can take additional dose for couple days Continue maintenance aspirin-no longer on Plavix.   Okay to hold aspirin for procedures or surgeries (5 to 7 days).      Relevant Orders   EKG 12-Lead (Completed)   Lipid panel   Comprehensive metabolic panel   CBC   NSTEMI, April 2013 (Chronic)    Almost 10 years out from her MI.  She has not had any more of the angina type chest pain discussed multiple different types of chest pain which seems more musculoskeletal in nature.  Myoview just last summer showed no evidence of ischemia or infarction.  Normal EF.  On stable regimen.  Somewhat limited by low blood pressure.      Relevant Orders   EKG 12-Lead (Completed)   Lipid panel   Comprehensive metabolic panel   CBC   Hyperlipidemia with target LDL less than 70 (Chronic)    Labs appear much at goal as of May 2022.  Due for labs to be  rechecked this May.  We will order lipid panel.  Continue current dose of rosuvastatin.      Relevant Orders   Lipid panel   Comprehensive metabolic panel   CBC     Other   Precordial pain (Chronic)    Somewhat reproducible chest pain that seems to be chronic.  Unless the symptoms are exertional in nature, I am not inclined to continue to follow a stress test.  Usually they happen with stress or being tired.      Relevant Orders   EKG 12-Lead (Completed)   Lipid panel   Comprehensive metabolic panel   CBC   Episode of dizziness (Chronic)    Episodic dizziness usually relieved with eating something and drinking soda.  This would indicate she could be needed hypoglycemic or hypovolemic/dehydrated.  She only drinks about 3 bottles of water a day which may not quite be enough. We talked at the importance of staying adequately hydrated drinking 4-5 bottles per day (16 ounces each) with making sure that she also eats something.  She needs to be more conscious of eating substantial foods to ensure she is adequate protein.  Seems that she does not eat much during the day.       ===================================  HPI:    Tammy Murillo is a 76 y.o. female with a PMH CAD-PCI with CRFs of  HTN & HLD who presents today for 6 month f/u -> after the case, she has her complaints of dizziness/fatigue and precordial chest pain.Tammy Murillo was last seen in June 2022 -in follow-up after Myoview Stress Test that was low risk with no ischemia, and normal EF.Marland Kitchen  She still complained of chest discomfort and off-and-on takes nitroglycerin.  This happens with exertion.  Noticed generalized fatigue symptoms, more tired than usual. Continue to treat with Toprol 12.5 mg daily and Imdur 60 mg  Recent Hospitalizations: none  She called in on late December 2022 with complaints of chest pain.  We increased her Imdur dose to 120 mg for several days and then had to return back to 60 mg.  Reviewed  CV studies:     The following studies were reviewed today: (if available, images/films reviewed: From Epic Chart or Care Everywhere) No new studies :   Interval History:   Tammy Murillo presents here today with her daughter who does not live unusually counseled her.  Thankfully, which she is accompanied by a licensed contracted interpreter.  As is usually the case, very difficult conversation with prolonged discussions between the interpreter and the patient leading to short few word answers.  Most notable thing she mentions today is having intermittent episodes of dizziness that happen once twice a day.  The spells last about 20 to 30 minutes.  It can be with or without exertion, can be sitting down or lying down.  Usually is worse when she is sitting up or standing.  Usually she treats these with eating chocolate and drinking something.  Thankfully, she has not actually passed out, but does get dizzy.    She also has remittent diffuse pinpoint chest discomfort off-and-on but is there with her without exertion.  No significant exertional dyspnea or chest pain.  No PND, orthopnea or edema.  She denies any irregular heartbeats palpitations.  Just may be short-lived skipped beats but nothing that lasts more than a few seconds.  CV Review of Symptoms (Summary) Cardiovascular ROS: positive for - chest pain, palpitations, and lightheadedness and fatigue negative for - dyspnea on exertion, irregular heartbeat, loss of consciousness, orthopnea, paroxysmal nocturnal dyspnea, rapid heart rate, shortness of breath, or syncope or near syncope, TIA, amaurosis fugax,  claudication  REVIEWED OF SYSTEMS   Review of Systems  Constitutional:  Positive for malaise/fatigue (We will easily worn out). Negative for weight loss.  HENT:  Negative for congestion and nosebleeds.   Respiratory:  Positive for cough and shortness of breath (Per HPI).   Cardiovascular:  Negative for leg swelling.       Per HPI  Gastrointestinal:   Negative for blood in stool and melena.  Genitourinary:  Negative for hematuria.  Musculoskeletal:  Positive for joint pain, myalgias and neck pain. Negative for back pain and falls.  Neurological:  Positive for dizziness. Negative for focal weakness.  Psychiatric/Behavioral:  Negative for memory loss. The patient is nervous/anxious. The patient does not have insomnia.    I have reviewed and (if needed) personally updated the patient's problem list, medications, allergies, past medical and surgical history, social and family history.   PAST MEDICAL HISTORY   Past Medical History:  Diagnosis Date   Anxiety    Borderline hypertension    CAD S/P percutaneous coronary angioplasty 12/31/2011   3 sequential LAD lesions treated with a single Promus Element DES; - Myoview 02/2016: No infarct or ischemia. EF 60-65%.   Costochondritis    Dyslipidemia,  goal LDL below 70    H/O echocardiogram April 2014   Normal EF: 55-60%, no regional WMA, grade 1 diastolic dysfunction. Mild MR   History of colon cancer  2010   History of: Non-ST elevation MI (NSTEMI) 12/31/2011   Severe LAD lesions: Sequential 90%, 70% and 60%; no other significant stenoses. She had had a normal Myoview in 2012 AS WELL AS POST-PCI IN 2017   Osteoarthritis    Presence of drug coated stent in LAD coronary artery 01/01/2012   Promus Element DES 2.75 mm x38 mm postdilated to 3.0 mm   PAST SURGICAL HISTORY   Past Surgical History:  Procedure Laterality Date   CHOLECYSTECTOMY  2004   LEFT HEART CATHETERIZATION WITH CORONARY ANGIOGRAM N/A 01/01/2012   Procedure: LEFT HEART CATHETERIZATION WITH CORONARY ANGIOGRAM;  Surgeon: Troy Sine, MD;  Location: [facility] CATH LAB;  Service: Cardiovascular;  Laterality: N/A;   NM MYOVIEW LTD  02/2016    LOW RISK study. No ischemia or infarction. EF 60-65%.   NM MYOVIEW LTD  03/06/2021   Lexiscan Myoview: EF 55 to 60%.  No ischemia or infarction.  Normal wall motion.  LOW RISK.   PERCUTANEOUS  CORONARY STENT INTERVENTION (PCI-S)  01/01/2012   Procedure: PERCUTANEOUS CORONARY STENT INTERVENTION (PCI-S);  Surgeon: Troy Sine, MD;  Location: [facility] CATH LAB;  Service: Cardiovascula: LAD PCI: Promus Element DES 2.75 MM by 30 MM, postdilated to 3.0 MM   TRANSTHORACIC ECHOCARDIOGRAM  06/2017   EF 55-60%. Grade 1 diastolic dysfunction. Mild MR. no RWMA    There is no immunization history on file for this patient.  MEDICATIONS/ALLERGIES   Current Meds  Medication Sig   acetaminophen (TYLENOL) 325 MG tablet Take 2 tablets (650 mg total) by mouth every 6 (six) hours as needed for mild pain or headache (or Fever >/= 101).   aspirin EC 81 MG tablet Take 1 tablet (81 mg total) by mouth daily.   diclofenac Sodium (VOLTAREN) 1 % GEL Apply 1 application topically 4 (four) times daily.   esomeprazole (NEXIUM) 40 MG capsule TAKE 1 CAPSULE BY MOUTH EVERY DAY BEFORE BREAKFAST   isosorbide mononitrate (IMDUR) 60 MG 24 hr tablet Take 1 tablet (60 mg total) by mouth daily.   methylPREDNISolone (MEDROL DOSEPAK) 4 MG TBPK tablet    metoprolol succinate (TOPROL XL) 25 MG 24 hr tablet Take 0.5 tablets (12.5 mg total) by mouth daily.   nitroGLYCERIN (NITROSTAT) 0.4 MG SL tablet TAKE 1 TABLET UNDER THE TONGUE, EVERY 5 MINUETS FOR 3 DOSES, AS NEEDED FOR CHEST PAIN AS DIRECTED   ondansetron (ZOFRAN ODT) 8 MG disintegrating tablet Take 1 tablet (8 mg total) by mouth every 8 (eight) hours as needed for nausea or vomiting.   rosuvastatin (CRESTOR) 40 MG tablet TAKE 1 TABLET BY MOUTH EVERY DAY   VOLTAREN 1 % GEL Apply 1 application topically 4 (four) times daily as needed (for pain).     No Known Allergies  SOCIAL HISTORY/FAMILY HISTORY   Reviewed in Epic:  Pertinent findings:  Social History   Tobacco Use   Smoking status: Never   Smokeless tobacco: Never  Vaping Use   Vaping Use: Never used  Substance Use Topics   Alcohol use: No   Drug use: No   Social History   Social History Narrative   She  is a native [place] woman. She is married with 5 children and 7 grandchildren. One of her daughters or her granddaughters usually comes as an Astronomer. She does not  speak any English.   The granddaughter with her today notes that the patient is significantly prone to anxiety and stress related to family issues. Most of her symptoms are related to stressful situations in house.   She does not smoke or drink. She does not routinely exercise.    OBJCTIVE -PE, EKG, labs   Wt Readings from Last 3 Encounters:  10/27/21 134 lb (60.8 kg)  03/17/21 134 lb 3.2 oz (60.9 kg)  03/06/21 132 lb (59.9 kg)    Physical Exam: BP (!) 142/64 (BP Location: Right Arm, Patient Position: Sitting, Cuff Size: Normal)    Pulse 63    Ht 4\' 11"  (1.499 m)    Wt 134 lb (60.8 kg)    BMI 27.06 kg/m  Physical Exam Vitals reviewed.  Constitutional:      General: She is not in acute distress.    Appearance: She is well-developed and normal weight. She is not toxic-appearing or diaphoretic.  HENT:     Head: Atraumatic.  Neck:     Vascular: No carotid bruit.  Cardiovascular:     Rate and Rhythm: Normal rate and regular rhythm.     Heart sounds: Normal heart sounds. No murmur heard.   No friction rub. No gallop.  Pulmonary:     Effort: Pulmonary effort is normal. No respiratory distress.     Breath sounds: Normal breath sounds. No wheezing, rhonchi or rales.  Chest:     Chest wall: Tenderness (R sternal border - chronic) present.  Musculoskeletal:        General: No swelling. Normal range of motion.     Cervical back: Normal range of motion and neck supple.  Skin:    General: Skin is warm and dry.  Neurological:     General: No focal deficit present.     Mental Status: She is alert and oriented to person, place, and time. Mental status is at baseline.     Cranial Nerves: No cranial nerve deficit or facial asymmetry.  Psychiatric:        Mood and Affect: Mood normal. Mood is not anxious.        Speech:  Speech normal.        Behavior: Behavior normal. Behavior is not agitated.        Thought Content: Thought content normal.        Judgment: Judgment normal.     Adult ECG Report  Rate: 63 ;  Rhythm: normal sinus rhythm and ST&T abnormality - ? Anterolateral ischemia ;   Narrative Interpretation: stable  Recent Labs:  reviewed -- stable (due for re-look in May)  Lab Results  Component Value Date   CHOL 110 02/25/2021   HDL 33 (L) 02/25/2021   LDLCALC 58 02/25/2021   TRIG 99 02/25/2021   CHOLHDL 3.3 02/25/2021   Lab Results  Component Value Date   CREATININE 0.60 02/25/2021   BUN 23 02/25/2021   NA 142 02/25/2021   K 4.4 02/25/2021   CL 104 02/25/2021   CO2 22 02/25/2021   CBC Latest Ref Rng & Units 07/17/2017 07/16/2017 07/15/2017  WBC 4.0 - 10.5 K/uL 5.2 3.7(L) 2.8(L)  Hemoglobin 12.0 - 15.0 g/dL 10.4(L) 11.1(L) 9.7(L)  Hematocrit 36.0 - 46.0 % 31.9(L) 33.6(L) 30.1(L)  Platelets 150 - 400 K/uL 128(L) 98(L) 61(L)    No results found for: HGBA1C Lab Results  Component Value Date   TSH 2.525 07/15/2017    ==================================================  COVID-19 Education: The signs and symptoms of COVID-19 were  discussed with the patient and how to seek care for testing (follow up with PCP or arrange E-visit).    I spent a total of 31 minutes with the patient spent in direct patient consultation.  Additional time spent with chart review  / charting (studies, outside notes, etc): 18 min Total Time: 49 min  Current medicines are reviewed at length with the patient today.  (+/- concerns) n/a  This visit occurred during the SARS-CoV-2 public health emergency.  Safety protocols were in place, including screening questions prior to the visit, additional usage of staff PPE, and extensive cleaning of exam room while observing appropriate contact time as indicated for disinfecting solutions.  Notice: This dictation was prepared with Dragon dictation along with smart  phrase technology. Any transcriptional errors that result from this process are unintentional and may not be corrected upon review.  Studies Ordered:   Orders Placed This Encounter  Procedures   Lipid panel   Comprehensive metabolic panel   CBC   EKG 12-Lead    Patient Instructions / Medication Changes & Studies & Tests Ordered   Patient Instructions  Medication Instructions:  Continue with 60 mg Imdur   *If you need a refill on your cardiac medications before your next appointment, please call your pharmacy*  Other Instructions Hydrate Drink at least 4 to  5  - 20 oz bottles a day  you can mix juice  with the  water if needed    When you are dizzy -  eat something crackers ,, chocolate or any food  is okay and drink  water.    Lab Work:fasting  In July 2023 Cbc Cmp lipid If you have labs (blood work) drawn today and your tests are completely normal, you will receive your results only by: [place] (if you have MyChart) OR A paper copy in the mail If you have any lab test that is abnormal or we need to change your treatment, we will call you to review the results.   Testing/Procedures: Not needed   Follow-Up: At [facility], you and your health needs are our priority.  As part of our continuing mission to provide you with exceptional heart care, we have created designated Provider Care Teams.  These Care Teams include your primary Cardiologist (physician) and Advanced Practice Providers (APPs -  Physician Assistants and Nurse Practitioners) who all work together to provide you with the care you need, when you need it.  We recommend signing up for the patient portal called "MyChart".  Sign up information is provided on this After Visit Summary.  MyChart is used to connect with patients for Virtual Visits (Telemedicine).  Patients are able to view lab/test results, encounter notes, upcoming appointments, etc.  Non-urgent messages can be sent to your provider as  well.   To learn more about what you can do with MyChart, go to [email or web address].    Your next appointment:   6 month(s)  The format for your next appointment:   In Person  Provider:   Glenetta Hew, MD     Other Instructions Hydrate Drink at least 4 to  5  - 20 oz bottles a day  you can mix juice  with the  water if needed    When you are dizzy -  eat something crackers ,, chocolate or any food  is okay and drink  water.      Glenetta Hew, M.D., M.S. Interventional Cardiologist   Pager # [phone] Phone # [phone] [address]  [place], [place] [address]   Thank you for choosing Heartcare at [facility]!!

## 2021-10-27 NOTE — Patient Instructions (Signed)
Medication Instructions:  Continue with 60 mg Imdur   *If you need a refill on your cardiac medications before your next appointment, please call your pharmacy*  Other Instructions Hydrate Drink at least 4 to  5  - 20 oz bottles a day  you can mix juice  with the  water if needed    When you are dizzy -  eat something crackers ,, chocolate or any food  is okay and drink  water.    Lab Work:fasting  In July 2023 Cbc Cmp lipid If you have labs (blood work) drawn today and your tests are completely normal, you will receive your results only by: [place] (if you have MyChart) OR A paper copy in the mail If you have any lab test that is abnormal or we need to change your treatment, we will call you to review the results.   Testing/Procedures: Not needed   Follow-Up: At [facility], you and your health needs are our priority.  As part of our continuing mission to provide you with exceptional heart care, we have created designated Provider Care Teams.  These Care Teams include your primary Cardiologist (physician) and Advanced Practice Providers (APPs -  Physician Assistants and Nurse Practitioners) who all work together to provide you with the care you need, when you need it.  We recommend signing up for the patient portal called "MyChart".  Sign up information is provided on this After Visit Summary.  MyChart is used to connect with patients for Virtual Visits (Telemedicine).  Patients are able to view lab/test results, encounter notes, upcoming appointments, etc.  Non-urgent messages can be sent to your provider as well.   To learn more about what you can do with MyChart, go to [email or web address].    Your next appointment:   6 month(s)  The format for your next appointment:   In Person  Provider:   Glenetta Hew, MD     Other Instructions Hydrate Drink at least 4 to  5  - 20 oz bottles a day  you can mix juice  with the  water if needed    When you are dizzy  -  eat something crackers ,, chocolate or any food  is okay and drink  water.

## 2021-10-29 ENCOUNTER — Encounter: Payer: Self-pay | Admitting: Cardiology

## 2021-10-29 NOTE — Assessment & Plan Note (Signed)
Labs appear much at goal as of May 2022.  Due for labs to be rechecked this May.  We will order lipid panel.  Continue current dose of rosuvastatin.

## 2021-10-29 NOTE — Assessment & Plan Note (Signed)
Episodic dizziness usually relieved with eating something and drinking soda.  This would indicate she could be needed hypoglycemic or hypovolemic/dehydrated.  She only drinks about 3 bottles of water a day which may not quite be enough. We talked at the importance of staying adequately hydrated drinking 4-5 bottles per day (16 ounces each) with making sure that she also eats something.  She needs to be more conscious of eating substantial foods to ensure she is adequate protein.  Seems that she does not eat much during the day.

## 2021-10-29 NOTE — Assessment & Plan Note (Signed)
Somewhat reproducible chest pain that seems to be chronic.  Unless the symptoms are exertional in nature, I am not inclined to continue to follow a stress test.  Usually they happen with stress or being tired.

## 2021-10-29 NOTE — Assessment & Plan Note (Signed)
Single-vessel disease of the LAD with 3 lesions treated with 1 stent.  No longer having any of that type of anginal type pain, still has musculoskeletal pain that has been evaluated on several occasions with negative biopsies.  Most recently last year.  She really does not actually have exertional chest tightness or pressure, most the time its at rest.  We are limited because of symptomatic low blood pressures and dizziness as far as titrating med Ications.  Plan:  Unstable low-dose Toprol 12.5 mg daily.  Not able to titrate further because of heart rate of 63 dizziness.  Continue current dose of Imdur--if she does have spells of chest pain, she can take additional dose for couple days  Continue maintenance aspirin-no longer on Plavix.    Okay to hold aspirin for procedures or surgeries (5 to 7 days).

## 2021-10-29 NOTE — Assessment & Plan Note (Signed)
Almost 10 years out from her MI.  She has not had any more of the angina type chest pain discussed multiple different types of chest pain which seems more musculoskeletal in nature.  Myoview just last summer showed no evidence of ischemia or infarction.  Normal EF.  On stable regimen.  Somewhat limited by low blood pressure.

## 2021-11-13 ENCOUNTER — Other Ambulatory Visit: Payer: Self-pay

## 2021-11-13 ENCOUNTER — Other Ambulatory Visit: Payer: Self-pay | Admitting: Cardiology

## 2021-11-13 MED ORDER — ROSUVASTATIN CALCIUM 40 MG PO TABS: 40.0000 mg | ORAL_TABLET | Freq: Every day | ORAL | 2 refills | Status: DC

## 2021-12-29 ENCOUNTER — Ambulatory Visit: Payer: Medicare Other | Admitting: Cardiology

## 2022-01-01 ENCOUNTER — Other Ambulatory Visit: Payer: Self-pay | Admitting: Cardiology

## 2022-02-17 ENCOUNTER — Telehealth: Payer: Self-pay | Admitting: Cardiology

## 2022-02-17 NOTE — Telephone Encounter (Signed)
*  STAT* If patient is at the pharmacy, call can be transferred to refill team.   1. Which medications need to be refilled? (please list name of each medication and dose if known)   esomeprazole (NEXIUM) 40 MG capsule    2. Which pharmacy/location (including street and city if local pharmacy) is medication to be sent to? [id number] - [place], [place] - [address]  3. Do they need a 30 day or 90 day supply?  30 day   Pt states she is out of medication. Please advise

## 2022-02-18 MED ORDER — ESOMEPRAZOLE MAGNESIUM 40 MG PO CPDR: 40.0000 mg | DELAYED_RELEASE_CAPSULE | Freq: Every day | ORAL | 3 refills | Status: DC

## 2022-04-02 ENCOUNTER — Other Ambulatory Visit: Payer: Self-pay | Admitting: Cardiology

## 2022-04-14 LAB — COMPREHENSIVE METABOLIC PANEL WITH GFR
ALT: 28 [IU]/L (ref 0–32)
AST: 40 [IU]/L (ref 0–40)
Albumin/Globulin Ratio: 1.4 (ref 1.2–2.2)
Albumin: 4.4 g/dL (ref 3.8–4.8)
Alkaline Phosphatase: 66 [IU]/L (ref 44–121)
BUN/Creatinine Ratio: 26 (ref 12–28)
BUN: 15 mg/dL (ref 8–27)
Bilirubin Total: 0.4 mg/dL (ref 0.0–1.2)
CO2: 24 mmol/L (ref 20–29)
Calcium: 9.4 mg/dL (ref 8.7–10.3)
Chloride: 105 mmol/L (ref 96–106)
Creatinine, Ser: 0.58 mg/dL (ref 0.57–1.00)
Globulin, Total: 3.1 g/dL (ref 1.5–4.5)
Glucose: 106 mg/dL — ABNORMAL HIGH (ref 70–99)
Potassium: 4.5 mmol/L (ref 3.5–5.2)
Sodium: 144 mmol/L (ref 134–144)
Total Protein: 7.5 g/dL (ref 6.0–8.5)
eGFR: 93 mL/min/{1.73_m2}

## 2022-04-14 LAB — CBC
Hematocrit: 35.6 % (ref 34.0–46.6)
Hemoglobin: 10.6 g/dL — ABNORMAL LOW (ref 11.1–15.9)
MCH: 20.7 pg — ABNORMAL LOW (ref 26.6–33.0)
MCHC: 29.8 g/dL — ABNORMAL LOW (ref 31.5–35.7)
MCV: 70 fL — ABNORMAL LOW (ref 79–97)
Platelets: 255 10*3/uL (ref 150–450)
RBC: 5.12 x10E6/uL (ref 3.77–5.28)
RDW: 16.8 % — ABNORMAL HIGH (ref 11.7–15.4)
WBC: 8.5 10*3/uL (ref 3.4–10.8)

## 2022-04-14 LAB — LIPID PANEL
Chol/HDL Ratio: 2.8 ratio (ref 0.0–4.4)
Cholesterol, Total: 115 mg/dL (ref 100–199)
HDL: 41 mg/dL (ref >39)
LDL Chol Calc (NIH): 52 mg/dL (ref 0–99)
Triglycerides: 125 mg/dL (ref 0–149)
VLDL Cholesterol Cal: 22 mg/dL (ref 5–40)

## 2022-04-20 ENCOUNTER — Encounter: Payer: Self-pay | Admitting: Cardiology

## 2022-04-20 ENCOUNTER — Ambulatory Visit (INDEPENDENT_AMBULATORY_CARE_PROVIDER_SITE_OTHER): Payer: Medicare Other | Admitting: Cardiology

## 2022-04-20 VITALS — BP 122/76 | HR 65 | Ht 59.0 in | Wt 126.4 lb

## 2022-04-20 DIAGNOSIS — E785 Hyperlipidemia, unspecified: Secondary | ICD-10-CM | POA: Diagnosis not present

## 2022-04-20 DIAGNOSIS — R072 Precordial pain: Secondary | ICD-10-CM | POA: Diagnosis not present

## 2022-04-20 DIAGNOSIS — Z9861 Coronary angioplasty status: Secondary | ICD-10-CM

## 2022-04-20 DIAGNOSIS — T466X5D Adverse effect of antihyperlipidemic and antiarteriosclerotic drugs, subsequent encounter: Secondary | ICD-10-CM

## 2022-04-20 DIAGNOSIS — I214 Non-ST elevation (NSTEMI) myocardial infarction: Secondary | ICD-10-CM

## 2022-04-20 DIAGNOSIS — I5032 Chronic diastolic (congestive) heart failure: Secondary | ICD-10-CM

## 2022-04-20 DIAGNOSIS — I2510 Atherosclerotic heart disease of native coronary artery without angina pectoris: Secondary | ICD-10-CM | POA: Diagnosis not present

## 2022-04-20 DIAGNOSIS — R42 Dizziness and giddiness: Secondary | ICD-10-CM

## 2022-04-20 DIAGNOSIS — M791 Myalgia, unspecified site: Secondary | ICD-10-CM

## 2022-04-20 DIAGNOSIS — R030 Elevated blood-pressure reading, without diagnosis of hypertension: Secondary | ICD-10-CM

## 2022-04-20 NOTE — Patient Instructions (Signed)
Medication Instructions:  No changes *If you need a refill on your cardiac medications before your next appointment, please call your pharmacy*   Lab Work:   Before you return in 12 months need labs  June 2024- Fasting  Cmp Lipid Cbc HgbA1c  If you have labs (blood work) drawn today and your tests are completely normal, you will receive your results only by: MyChart Message (if you have MyChart) OR A paper copy in the mail If you have any lab test that is abnormal or we need to change your treatment, we will call you to review the results.   Testing/Procedures: Not needed   Follow-Up: At [facility], you and your health needs are our priority.  As part of our continuing mission to provide you with exceptional heart care, we have created designated Provider Care Teams.  These Care Teams include your primary Cardiologist (physician) and Advanced Practice Providers (APPs -  Physician Assistants and Nurse Practitioners) who all work together to provide you with the care you need, when you need it.  We recommend signing up for the patient portal called "MyChart".  Sign up information is provided on this After Visit Summary.  MyChart is used to connect with patients for Virtual Visits (Telemedicine).  Patients are able to view lab/test results, encounter notes, upcoming appointments, etc.  Non-urgent messages can be sent to your provider as well.   To learn more about what you can do with MyChart, go to [email or web address].    Your next appointment:   12 month(s)  The format for your next appointment:   In Person  Provider:   Bryan Lemma, MD       Important Information About Sugar

## 2022-04-20 NOTE — Progress Notes (Signed)
Primary Care Provider: Nolene Ebbs, MD Cardiologist: Glenetta Hew, MD Electrophysiologist: None  Clinic Note: Chief Complaint  Patient presents with   Follow-up    27-month follow-up.  Doing well.  Notably less dizziness spells.   Coronary Artery Disease    No angina or CHF    ===================================  ASSESSMENT/PLAN   Problem List Items Addressed This Visit       Cardiology Problems   CAD S/P  - DES to LAD April 2013 - Primary (Chronic)    Interestingly, after being seen for couple years for what sounds like atypical musculoskeletal chest pain, she came in with a true ACS/non-STEMI in April 2013.  She had 3 lesions the LAD treated with 1 stent.  No other significant disease. She has had off-and-on atypical chest pain since then, has been evaluated multiple different stress test all of been nonischemic.  She remains on stable dose of Imdur 60 mg daily, and is only required 1 dose of nitroglycerin in the last month. She is on stable dose of Toprol 12.5 mg daily. No longer on Plavix, only on maintenance dose aspirin 81 mg.  Less bruising. On stable dose of statin with well-controlled lipids.      Relevant Orders   Lipid panel   Comprehensive metabolic panel   CBC   Hemoglobin A1c   NSTEMI, April 2013 (Chronic)    More than 10 years out from her MI.  No recurrent angina or heart failure symptoms.      Relevant Orders   Comprehensive metabolic panel   CBC   Hemoglobin A1c   Hyperlipidemia with target LDL less than 70 (Chronic)    Most recent lipid panel from last week was outstanding with LDL of 52.  Pretty much at target on current dose of rosuvastatin.  Tolerating well.  Continue rosuvastatin 40 mg daily Follow-up labs 1 year. Has had borderline hyperglycemia.  We will therefore check A1c along with a CBC-on aspirin.      Relevant Orders   Lipid panel   Comprehensive metabolic panel   CBC   Hemoglobin A1c   Chronic diastolic CHF (congestive  heart failure) (HCC) (Chronic)    No active CHF symptoms.  NYHA Class I on minimal medications.  Euvolemic.      Relevant Orders   Comprehensive metabolic panel   CBC   Hemoglobin A1c     Other   Borderline hypertension (Chronic)    She really does not have hypertension.  He is only able to tolerate very low-dose of Toprol.  BP stable.      Relevant Orders   Comprehensive metabolic panel   CBC   Hemoglobin A1c   Myalgia due to statin    She did not tolerate atorvastatin, but is doing well with rosuvastatin      Episode of dizziness (Chronic)    Dizzy spells were related to dehydration and not eating.  She is now paying close attention to make sure he drinks enough water, and if she starts feeling dizzy, she will drink water and eat some candy which she usually leads to pretty quick resolution.      Precordial pain (Chronic)    She usually has some reproducible chest pain along the left side of the sternum.  Not really bothering her today.      Relevant Orders   Comprehensive metabolic panel   CBC   Hemoglobin A1c    ===================================  HPI:    Tammy Murillo is a 76  y.o. female with a PMH notable for non-STEMI/CAD-PCI, CRF HTN HLD with chronic dizziness and fatigue and precordial chest pain who presents today for 16-month follow-up at the request of Fleet Contras, MD.  Tammy Murillo was last seen on Oct 27, 2021 accompanied by her daughter & Interpreter.  Noted intermittent episodes of dizziness may watch twice a day lasting 20 to 30 seconds.  With or without exertion.  Standing or sitting down.  Worse when sitting up or standing, not with lying.  Usually treats this by eating chocolate or drinking something.  No passout spells.  Also notes intermittent diffuse pinpoint chest discomfort off-and-on without exertion.  No DOE or significant chest pain.  No PND orthopnea edema. Continued stable low-dose Toprol 12.5 mg daily along with 60 mg Imdur, aspirin 81 mg and  rosuvastatin 40 mg. Lipids, chemistry and CBC ordered. Discussed importance of staying adequately hydrated-4 to 5 16 ounce bottles a day.    Recent Hospitalizations: None  Reviewed  CV studies:    The following studies were reviewed today: (if available, images/films reviewed: From Epic Chart or Care Everywhere) None:  Interval History:   Tammy Murillo presents today, in great spirits.  She is accompanied by her friend and licensed interpreter.  She says that she is not having any more notable episodes of dizziness and lightheadedness.  When she starts feeling that way she drinks something and has some candy.  She denies any PND, orthopnea with trivial edema.  No real palpitations just feels her heart rate going up as expected with activity.  No issues when she gets outside and does her yard work and walking.  She actually does better in the hot than the cold weather.  She really only notes having chest pain or discomfort when she is upset or stressed.  She is only used nitroglycerin maybe once last month, but otherwise has not required it..  CV Review of Symptoms (Summary) Cardiovascular ROS: positive for - -rare, infrequent chest discomfort only if she gets upset.  Heart rate goes up quickly with activity.  Able to treat dizziness spells with drinking water and taking candy negative for - dyspnea on exertion, edema, orthopnea, paroxysmal nocturnal dyspnea, shortness of breath, or prolonged rapid irregular heartbeat sensations/arrhythmias.  Notably improved dizziness spells, no syncope/near syncope or TIA/amaurosis fugax, claudication.  REVIEWED OF SYSTEMS   Review of Systems  Constitutional:  Positive for weight loss (Not eating as much.  Less appetite). Negative for malaise/fatigue (Just tires out little easily.  Less get up and go.).  HENT:  Negative for congestion and nosebleeds.   Respiratory:  Positive for shortness of breath (Only when she overdoes it). Negative for cough.    Cardiovascular:        Per HPI  Gastrointestinal:  Positive for constipation (Off-and-on, but not so much now.). Negative for abdominal pain, blood in stool, heartburn and melena.  Genitourinary:  Negative for dysuria, hematuria and urgency.  Musculoskeletal:  Positive for joint pain (Mild aches and pains).  Neurological:  Positive for dizziness (Much improved). Negative for focal weakness, loss of consciousness and weakness.  Psychiatric/Behavioral:  Negative for depression and memory loss. The patient is nervous/anxious. The patient does not have insomnia.    I have reviewed and (if needed) personally updated the patient's problem list, medications, allergies, past medical and surgical history, social and family history.   PAST MEDICAL HISTORY   Past Medical History:  Diagnosis Date   Anxiety    Borderline hypertension  CAD S/P percutaneous coronary angioplasty 12/31/2011   3 sequential LAD lesions treated with a single Promus Element DES; - Myoview 02/2016: No infarct or ischemia. EF 60-65%.   Costochondritis    Dyslipidemia, goal LDL below 70    H/O echocardiogram April 2014   Normal EF: 55-60%, no regional WMA, grade 1 diastolic dysfunction. Mild MR   History of colon cancer  2010   History of: Non-ST elevation MI (NSTEMI) 12/31/2011   Severe LAD lesions: Sequential 90%, 70% and 60%; no other significant stenoses. She had had a normal Myoview in 2012 AS WELL AS POST-PCI IN 2017   Osteoarthritis    Presence of drug coated stent in LAD coronary artery 01/01/2012   Promus Element DES 2.75 mm x38 mm postdilated to 3.0 mm    PAST SURGICAL HISTORY   Past Surgical History:  Procedure Laterality Date   CHOLECYSTECTOMY  2004   LEFT HEART CATHETERIZATION WITH CORONARY ANGIOGRAM N/A 01/01/2012   Procedure: LEFT HEART CATHETERIZATION WITH CORONARY ANGIOGRAM;  Surgeon: Troy Sine, MD;  Location[facility] CATH LAB;  Service: Cardiovascular;  Laterality: N/A;   NM MYOVIEW LTD  02/2016     LOW RISK study. No ischemia or infarction. EF 60-65%.   NM MYOVIEW LTD  03/06/2021   Lexiscan Myoview: EF 55 to 60%.  No ischemia or infarction.  Normal wall motion.  LOW RISK.   PERCUTANEOUS CORONARY STENT INTERVENTION (PCI-S)  01/01/2012   Procedure: PERCUTANEOUS CORONARY STENT INTERVENTION (PCI-S);  Surgeon: Troy Sine, MD;  Location[facility] CATH LAB;  Service: Cardiovascula: LAD PCI: Promus Element DES 2.75 MM by 30 MM, postdilated to 3.0 MM   TRANSTHORACIC ECHOCARDIOGRAM  06/2017   EF 55-60%. Grade 1 diastolic dysfunction. Mild MR. no RWMA    There is no immunization history on file for this patient.  MEDICATIONS/ALLERGIES   Current Meds  Medication Sig   acetaminophen (TYLENOL) 325 MG tablet Take 2 tablets (650 mg total) by mouth every 6 (six) hours as needed for mild pain or headache (or Fever >/= 101).   aspirin EC 81 MG tablet Take 1 tablet (81 mg total) by mouth daily.   diclofenac Sodium (VOLTAREN) 1 % GEL Apply 1 application topically 4 (four) times daily.   esomeprazole (NEXIUM) 40 MG capsule Take 1 capsule (40 mg total) by mouth daily.   isosorbide mononitrate (IMDUR) 60 MG 24 hr tablet TAKE 1 TABLET BY MOUTH EVERY DAY   methylPREDNISolone (MEDROL DOSEPAK) 4 MG TBPK tablet    metoprolol succinate (TOPROL-XL) 25 MG 24 hr tablet TAKE 1/2 TABLET BY MOUTH EVERY DAY   nitroGLYCERIN (NITROSTAT) 0.4 MG SL tablet TAKE 1 TABLET UNDER THE TONGUE, EVERY 5 MINUETS FOR 3 DOSES, AS NEEDED FOR CHEST PAIN AS DIRECTED   ondansetron (ZOFRAN ODT) 8 MG disintegrating tablet Take 1 tablet (8 mg total) by mouth every 8 (eight) hours as needed for nausea or vomiting.   rosuvastatin (CRESTOR) 40 MG tablet Take 1 tablet (40 mg total) by mouth daily.   VOLTAREN 1 % GEL Apply 1 application topically 4 (four) times daily as needed (for pain).     No Known Allergies  SOCIAL HISTORY/FAMILY HISTORY   Reviewed in Epic:  Pertinent findings:  Social History   Tobacco Use   Smoking status: Never    Smokeless tobacco: Never  Vaping Use   Vaping Use: Never used  Substance Use Topics   Alcohol use: No   Drug use: No   Social History   Social History  Narrative   She is a native [place] woman. She is married with 5 children and 7 grandchildren. One of her daughters or her granddaughters usually comes as an Astronomer. She does not speak any [place].   The granddaughter with her today notes that the patient is significantly prone to anxiety and stress related to family issues. Most of her symptoms are related to stressful situations in house.   She does not smoke or drink. She does not routinely exercise.   She had previously always wanted to go back to [place], but COVID caused her to adjust her plans.  Now as she is getting older, she is a little bit leery of travel, and does not want to leave her grandkids.  She is torn between living her grandkids and going back to see her home land.  Right now, she is leaning more towards staying put here in [place].  OBJCTIVE -PE, EKG, labs   Wt Readings from Last 3 Encounters:  04/20/22 126 lb 6.4 oz (57.3 kg)  10/27/21 134 lb (60.8 kg)  03/17/21 134 lb 3.2 oz (60.9 kg)    Physical Exam: BP 122/76   Pulse 65   Ht 4\' 11"  (1.499 m)   Wt 126 lb 6.4 oz (57.3 kg)   SpO2 97%   BMI 25.53 kg/m  Physical Exam Vitals reviewed.  Constitutional:      General: She is not in acute distress.    Appearance: Normal appearance. She is obese. She is not ill-appearing or toxic-appearing.     Comments: Well-nourished, well-groomed.  She does seem lighter  HENT:     Head: Normocephalic and atraumatic.  Neck:     Vascular: No carotid bruit or JVD.  Cardiovascular:     Rate and Rhythm: Normal rate and regular rhythm. No extrasystoles are present.    Chest Wall: PMI is not displaced.     Pulses: Normal pulses.     Heart sounds: S1 normal and S2 normal. No murmur heard.    No friction rub. No gallop.  Pulmonary:     Effort: Pulmonary effort is  normal. No respiratory distress.     Breath sounds: Normal breath sounds. No wheezing, rhonchi or rales.  Musculoskeletal:        General: No swelling. Normal range of motion.     Cervical back: Normal range of motion and neck supple.  Skin:    General: Skin is warm and dry.  Neurological:     General: No focal deficit present.     Mental Status: She is alert and oriented to person, place, and time.     Gait: Gait normal.  Psychiatric:        Mood and Affect: Mood normal.        Behavior: Behavior normal.        Thought Content: Thought content normal.        Judgment: Judgment normal.     Comments: She is actually very good mood today.  Laughing and smiling    Adult ECG Report Not checked  Recent Labs: Reviewed Lab Results  Component Value Date   CHOL 115 04/13/2022   HDL 41 04/13/2022   LDLCALC 52 04/13/2022   TRIG 125 04/13/2022   CHOLHDL 2.8 04/13/2022   Lab Results  Component Value Date   CREATININE 0.58 04/13/2022   BUN 15 04/13/2022   NA 144 04/13/2022   K 4.5 04/13/2022   CL 105 04/13/2022   CO2 24 04/13/2022  Latest Ref Rng & Units 04/13/2022   11:26 AM 07/17/2017    5:39 AM 07/16/2017    5:06 AM  CBC  WBC 3.4 - 10.8 x10E3/uL 8.5  5.2  3.7   Hemoglobin 11.1 - 15.9 g/dL 50.9  32.6  71.2   Hematocrit 34.0 - 46.6 % 35.6  31.9  33.6   Platelets 150 - 450 x10E3/uL 255  128  98     No results found for: "HGBA1C" Lab Results  Component Value Date   TSH 2.525 07/15/2017    ================================================== I spent a total of 21 minutes with the patient spent in direct patient consultation.  Additional time spent with chart review  / charting (studies, outside notes, etc): 14 min Total Time: 35 min  Current medicines are reviewed at length with the patient today.  (+/- concerns) N/A  Notice: This dictation was prepared with Dragon dictation along with smart phrase technology. Any transcriptional errors that result from this process  are unintentional and may not be corrected upon review.  Studies Ordered:   Orders Placed This Encounter  Procedures   Lipid panel   Comprehensive metabolic panel   CBC   Hemoglobin A1c   No orders of the defined types were placed in this encounter.   Patient Instructions / Medication Changes & Studies & Tests Ordered   Patient Instructions  Medication Instructions:  No changes *If you need a refill on your cardiac medications before your next appointment, please call your pharmacy*   Lab Work:   Before you return in 12 months need labs  June 2024- Fasting  Cmp Lipid Cbc HgbA1c  If you have labs (blood work) drawn today and your tests are completely normal, you will receive your results only by: MyChart Message (if you have MyChart) OR A paper copy in the mail If you have any lab test that is abnormal or we need to change your treatment, we will call you to review the results.   Testing/Procedures: Not needed   Follow-Up: At [facility], you and your health needs are our priority.  As part of our continuing mission to provide you with exceptional heart care, we have created designated Provider Care Teams.  These Care Teams include your primary Cardiologist (physician) and Advanced Practice Providers (APPs -  Physician Assistants and Nurse Practitioners) who all work together to provide you with the care you need, when you need it.  We recommend signing up for the patient portal called "MyChart".  Sign up information is provided on this After Visit Summary.  MyChart is used to connect with patients for Virtual Visits (Telemedicine).  Patients are able to view lab/test results, encounter notes, upcoming appointments, etc.  Non-urgent messages can be sent to your provider as well.   To learn more about what you can do with MyChart, go to [email or web address].    Your next appointment:   12 month(s)  The format for your next appointment:   In Person  Provider:    Bryan Lemma, MD       Important Information About Sugar           Bryan Lemma, M.D., M.S. Interventional Cardiologist   Pager # [phone] Phone # [phone] [address] [place], [place] [address]   Thank you for choosing Heartcare at [facility]!!

## 2022-04-26 ENCOUNTER — Encounter: Payer: Self-pay | Admitting: Cardiology

## 2022-04-26 NOTE — Assessment & Plan Note (Signed)
More than 10 years out from her MI.  No recurrent angina or heart failure symptoms.

## 2022-04-26 NOTE — Assessment & Plan Note (Addendum)
Most recent lipid panel from last week was outstanding with LDL of 52.  Pretty much at target on current dose of rosuvastatin.  Tolerating well.  Continue rosuvastatin 40 mg daily Follow-up labs 1 year. Has had borderline hyperglycemia.  We will therefore check A1c along with a CBC-on aspirin.

## 2022-04-26 NOTE — Assessment & Plan Note (Signed)
Dizzy spells were related to dehydration and not eating.  She is now paying close attention to make sure he drinks enough water, and if she starts feeling dizzy, she will drink water and eat some candy which she usually leads to pretty quick resolution.

## 2022-04-26 NOTE — Assessment & Plan Note (Signed)
No active CHF symptoms.  NYHA Class I on minimal medications.  Euvolemic.

## 2022-04-26 NOTE — Assessment & Plan Note (Signed)
She did not tolerate atorvastatin, but is doing well with rosuvastatin

## 2022-04-26 NOTE — Assessment & Plan Note (Signed)
She really does not have hypertension.  He is only able to tolerate very low-dose of Toprol.  BP stable.

## 2022-04-26 NOTE — Assessment & Plan Note (Signed)
She usually has some reproducible chest pain along the left side of the sternum.  Not really bothering her today.

## 2022-04-26 NOTE — Assessment & Plan Note (Signed)
Interestingly, after being seen for couple years for what sounds like atypical musculoskeletal chest pain, she came in with a true ACS/non-STEMI in April 2013.  She had 3 lesions the LAD treated with 1 stent.  No other significant disease. She has had off-and-on atypical chest pain since then, has been evaluated multiple different stress test all of been nonischemic.   She remains on stable dose of Imdur 60 mg daily, and is only required 1 dose of nitroglycerin in the last month.  She is on stable dose of Toprol 12.5 mg daily.  No longer on Plavix, only on maintenance dose aspirin 81 mg.  Less bruising.  On stable dose of statin with well-controlled lipids.

## 2022-05-22 ENCOUNTER — Telehealth: Payer: Self-pay | Admitting: Cardiology

## 2022-05-22 MED ORDER — NITROGLYCERIN 0.4 MG SL SUBL: SUBLINGUAL_TABLET | SUBLINGUAL | 3 refills | Status: DC

## 2022-05-22 NOTE — Telephone Encounter (Signed)
*  STAT* If patient is at the pharmacy, call can be transferred to refill team.   1. Which medications need to be refilled? (please list name of each medication and dose if known)   nitroGLYCERIN (NITROSTAT) 0.4 MG SL tablet  2. Which pharmacy/location (including street and city if local pharmacy) is medication to be sent to?  [id number] - [place], [place] - [address]  3. Do they need a 30 day or 90 day supply?  30 day  Daughter called stating patient is out of this medication as the prescription has expired.

## 2022-05-28 ENCOUNTER — Other Ambulatory Visit: Payer: Self-pay | Admitting: Internal Medicine

## 2022-05-28 DIAGNOSIS — Z1231 Encounter for screening mammogram for malignant neoplasm of breast: Secondary | ICD-10-CM

## 2022-05-29 ENCOUNTER — Other Ambulatory Visit: Payer: Self-pay | Admitting: Internal Medicine

## 2022-05-29 DIAGNOSIS — G44209 Tension-type headache, unspecified, not intractable: Secondary | ICD-10-CM

## 2022-05-29 DIAGNOSIS — E2839 Other primary ovarian failure: Secondary | ICD-10-CM

## 2022-06-12 ENCOUNTER — Other Ambulatory Visit: Payer: Medicare Other

## 2022-06-25 ENCOUNTER — Ambulatory Visit
Admission: RE | Admit: 2022-06-25 | Discharge: 2022-06-25 | Disposition: A | Discharge status: Home / Self Care | Payer: Medicare Other | Source: Ambulatory Visit | Attending: Internal Medicine | Admitting: Internal Medicine

## 2022-06-25 DIAGNOSIS — Z1231 Encounter for screening mammogram for malignant neoplasm of breast: Secondary | ICD-10-CM

## 2022-07-20 ENCOUNTER — Ambulatory Visit
Admission: RE | Admit: 2022-07-20 | Discharge: 2022-07-20 | Disposition: A | Discharge status: Home / Self Care | Payer: Medicare Other | Source: Ambulatory Visit | Attending: Internal Medicine | Admitting: Internal Medicine

## 2022-07-20 DIAGNOSIS — G44209 Tension-type headache, unspecified, not intractable: Secondary | ICD-10-CM

## 2022-08-10 ENCOUNTER — Other Ambulatory Visit: Payer: Self-pay | Admitting: Cardiology

## 2023-02-06 ENCOUNTER — Other Ambulatory Visit: Payer: Self-pay | Admitting: Cardiology

## 2023-03-07 ENCOUNTER — Other Ambulatory Visit: Payer: Self-pay | Admitting: Cardiology

## 2023-04-22 ENCOUNTER — Ambulatory Visit: Payer: 59 | Attending: Nurse Practitioner | Admitting: Nurse Practitioner

## 2023-04-22 ENCOUNTER — Other Ambulatory Visit: Payer: Self-pay

## 2023-04-22 ENCOUNTER — Encounter: Payer: Self-pay | Admitting: Nurse Practitioner

## 2023-04-22 VITALS — BP 120/70 | HR 61 | Ht <= 58 in | Wt 130.6 lb

## 2023-04-22 DIAGNOSIS — R42 Dizziness and giddiness: Secondary | ICD-10-CM

## 2023-04-22 DIAGNOSIS — I2510 Atherosclerotic heart disease of native coronary artery without angina pectoris: Secondary | ICD-10-CM | POA: Diagnosis not present

## 2023-04-22 DIAGNOSIS — R030 Elevated blood-pressure reading, without diagnosis of hypertension: Secondary | ICD-10-CM

## 2023-04-22 DIAGNOSIS — I5032 Chronic diastolic (congestive) heart failure: Secondary | ICD-10-CM

## 2023-04-22 DIAGNOSIS — Z79899 Other long term (current) drug therapy: Secondary | ICD-10-CM

## 2023-04-22 DIAGNOSIS — E785 Hyperlipidemia, unspecified: Secondary | ICD-10-CM

## 2023-04-22 MED ORDER — NITROGLYCERIN 0.4 MG SL SUBL: SUBLINGUAL_TABLET | SUBLINGUAL | 3 refills | Status: AC

## 2023-04-22 MED ORDER — ISOSORBIDE MONONITRATE ER 60 MG PO TB24: 90.0000 mg | ORAL_TABLET | Freq: Every day | ORAL | 3 refills | Status: DC

## 2023-04-22 MED ORDER — METOPROLOL SUCCINATE ER 25 MG PO TB24: 12.5000 mg | ORAL_TABLET | Freq: Every day | ORAL | 3 refills | Status: DC

## 2023-04-22 MED ORDER — ROSUVASTATIN CALCIUM 40 MG PO TABS: 40.0000 mg | ORAL_TABLET | Freq: Every day | ORAL | 3 refills | Status: DC

## 2023-04-22 MED ORDER — ASPIRIN 81 MG PO TBEC: 81.0000 mg | DELAYED_RELEASE_TABLET | Freq: Every day | ORAL | 3 refills | Status: AC

## 2023-04-22 NOTE — Progress Notes (Signed)
Reordered labs.

## 2023-04-22 NOTE — Progress Notes (Signed)
Office Visit    Patient Name: Tammy Murillo Date of Encounter: 04/22/2023  Primary Care Provider:  Fleet Contras, MD Primary Cardiologist:  Bryan Lemma, MD  Chief Complaint    77 year old female with a history of CAD s/p DES-LAD in 2013, chronic diastolic heart failure, hypertension, hyperlipidemia, osteoarthritis, and anxiety who presents for follow-up related to CAD.   Past Medical History   Past Medical History:  Diagnosis Date   Anxiety    Borderline hypertension    CAD S/P percutaneous coronary angioplasty 12/31/2011   3 sequential LAD lesions treated with a single Promus Element DES; - Myoview 02/2016: No infarct or ischemia. EF 60-65%.   Costochondritis    Dyslipidemia, goal LDL below 70    H/O echocardiogram April 2014   Normal EF: 55-60%, no regional WMA, grade 1 diastolic dysfunction. Mild MR   History of colon cancer  2010   History of: Non-ST elevation MI (NSTEMI) 12/31/2011   Severe LAD lesions: Sequential 90%, 70% and 60%; no other significant stenoses. She had had a normal Myoview in 2012 AS WELL AS POST-PCI IN 2017   Osteoarthritis    Presence of drug coated stent in LAD coronary artery 01/01/2012   Promus Element DES 2.75 mm x38 mm postdilated to 3.0 mm   Past Surgical History:  Procedure Laterality Date   CHOLECYSTECTOMY  2004   LEFT HEART CATHETERIZATION WITH CORONARY ANGIOGRAM N/A 01/01/2012   Procedure: LEFT HEART CATHETERIZATION WITH CORONARY ANGIOGRAM;  Surgeon: Lennette Bihari, MD;  Location: [facility] CATH LAB;  Service: Cardiovascular;  Laterality: N/A;   NM MYOVIEW LTD  02/2016    LOW RISK study. No ischemia or infarction. EF 60-65%.   NM MYOVIEW LTD  03/06/2021   Lexiscan Myoview: EF 55 to 60%.  No ischemia or infarction.  Normal wall motion.  LOW RISK.   PERCUTANEOUS CORONARY STENT INTERVENTION (PCI-S)  01/01/2012   Procedure: PERCUTANEOUS CORONARY STENT INTERVENTION (PCI-S);  Surgeon: Lennette Bihari, MD;  Location: [facility] CATH LAB;  Service: Cardiovascula:  LAD PCI: Promus Element DES 2.75 MM by 30 MM, postdilated to 3.0 MM   TRANSTHORACIC ECHOCARDIOGRAM  06/2017   EF 55-60%. Grade 1 diastolic dysfunction. Mild MR. no RWMA    Allergies  No Known Allergies   Labs/Other Studies Reviewed    The following studies were reviewed today:  Cardiac Studies & Procedures     STRESS TESTS  MYOCARDIAL PERFUSION IMAGING 03/06/2021  Narrative  The left ventricular ejection fraction is normal (55-65%).  Nuclear stress EF: 59%.  There was no ST segment deviation noted during stress.  The study is normal.  This is a low risk study.  No ischemia or infarction on perfusion images. Normal wall motion.   ECHOCARDIOGRAM  ECHOCARDIOGRAM COMPLETE 07/15/2017  Narrative *[place]* *[facility]* [address] [place], [place] [address] [phone]  ------------------------------------------------------------------- Transthoracic Echocardiography  Patient:    Tammy Murillo MR #:       [id number] Study Date: 07/15/2017 Gender:     F Age:        71 Height:     149.9 cm Weight:     61.8 kg BSA:        1.62 m^2 Pt. Status: Room:       1406  PERFORMING   W. Viann Fish, MD [facility]    [name] [address]    [name] [id number] SONOGRAPHER  [name], RDCS ATTENDING  Elgergawy, Dawood S  cc:  ------------------------------------------------------------------- LV EF: 55% -   60%  ------------------------------------------------------------------- Indications:      429.3 Cardiomegaly.  ------------------------------------------------------------------- History:   PMH:   Coronary artery disease.  Risk factors: Dyslipidemia.  ------------------------------------------------------------------- Study Conclusions  - Left ventricle: The cavity size was normal. Systolic function was normal. The estimated ejection fraction was in the range of  55% to 60%. Wall motion was normal; there were no regional wall motion abnormalities. - Left atrium: The atrium was mildly dilated.  ------------------------------------------------------------------- Study data:   Study status:  Routine.  Study completion:  There were no complications.          Transthoracic echocardiography. M-mode, complete 2D, spectral Doppler, and color Doppler. Birthdate:  Patient birthdate: 1946-01-15.  Age:  Patient is 78 yr old.  Sex:  Gender: female.    BMI: 27.5 kg/m^2.  Blood pressure: 114/81  Patient status:  Inpatient.  Study date:  Study date: 07/15/2017. Study time: 09:52 AM.  Location:  Bedside.  -------------------------------------------------------------------  ------------------------------------------------------------------- Left ventricle:  The cavity size was normal. Systolic function was normal. The estimated ejection fraction was in the range of 55% to 60%. Wall motion was normal; there were no regional wall motion abnormalities.  ------------------------------------------------------------------- Aortic valve:   Trileaflet; normal thickness leaflets. Mobility was not restricted.  Doppler:  Transvalvular velocity was within the normal range. There was no stenosis. There was no regurgitation.  ------------------------------------------------------------------- Aorta:  Aortic root: The aortic root was normal in size.  ------------------------------------------------------------------- Mitral valve:   Structurally normal valve.   Mobility was not restricted.  Doppler:  Transvalvular velocity was within the normal range. There was no evidence for stenosis. There was trivial regurgitation.    Peak gradient (D): 5 mm Hg.  ------------------------------------------------------------------- Left atrium:  The atrium was mildly dilated.  ------------------------------------------------------------------- Right ventricle:  The cavity size was  normal. Wall thickness was normal. Systolic function was normal.  ------------------------------------------------------------------- Pulmonic valve:   Not well visualized.  The valve appears to be grossly normal.    Doppler:  Transvalvular velocity was within the normal range. There was no evidence for stenosis.  ------------------------------------------------------------------- Tricuspid valve:   Structurally normal valve.    Doppler: Transvalvular velocity was within the normal range. There was mild regurgitation.  ------------------------------------------------------------------- Pulmonary artery:   The main pulmonary artery was normal-sized. Systolic pressure was within the normal range.  ------------------------------------------------------------------- Right atrium:  The atrium was normal in size.  ------------------------------------------------------------------- Pericardium:  There was no pericardial effusion.  ------------------------------------------------------------------- Systemic veins: Inferior vena cava: The vessel was normal in size.  ------------------------------------------------------------------- Measurements  Left ventricle                           Value        Reference LV ID, ED, PLAX chordal                  47.9  mm     43 - 52 LV ID, ES, PLAX chordal                  33.1  mm     23 - 38 LV fx shortening, PLAX chordal           31    %      >=29 LV PW thickness, ED                      10.6  mm     ---------  IVS/LV PW ratio, ED                      0.97         <=1.3 Stroke volume, 2D                        51    ml     --------- Stroke volume/bsa, 2D                    31    ml/m^2 --------- LV e&', lateral                           6.31  cm/s   --------- LV E/e&', lateral                         16.96        --------- LV e&', medial                            6.53  cm/s   --------- LV E/e&', medial                          16.39         --------- LV e&', average                           6.42  cm/s   --------- LV E/e&', average                         16.67        ---------  Ventricular septum                       Value        Reference IVS thickness, ED                        10.3  mm     ---------  LVOT                                     Value        Reference LVOT ID, S                               17    mm     --------- LVOT area                                2.27  cm^2   --------- LVOT peak velocity, S                    85    cm/s   --------- LVOT mean velocity, S                    61.2  cm/s   --------- LVOT VTI, S  22.4  cm     ---------  Aorta                                    Value        Reference Aortic root ID, ED                       33    mm     ---------  Left atrium                              Value        Reference LA ID, A-P, ES                           32    mm     --------- LA ID/bsa, A-P                           1.97  cm/m^2 <=2.2 LA volume, S                             44.3  ml     --------- LA volume/bsa, S                         27.3  ml/m^2 --------- LA volume, ES, 1-p A4C                   25.5  ml     --------- LA volume/bsa, ES, 1-p A4C               15.7  ml/m^2 --------- LA volume, ES, 1-p A2C                   75    ml     --------- LA volume/bsa, ES, 1-p A2C               46.2  ml/m^2 ---------  Mitral valve                             Value        Reference Mitral E-wave peak velocity              107   cm/s   --------- Mitral A-wave peak velocity              128   cm/s   --------- Mitral deceleration time                 211   ms     150 - 230 Mitral peak gradient, D                  5     mm Hg  --------- Mitral E/A ratio, peak                   0.8          ---------  Right ventricle                          Value  Reference TAPSE                                    25.6  mm     --------- RV s&', lateral, S                         12.2  cm/s   ---------  Legend: (L)  and  (H)  mark values outside specified reference range.  ------------------------------------------------------------------- Prepared and Electronically Authenticated by  Georga Hacking, MD [facility] 2018-10-18T13:31:42            Recent Labs: No results found for requested labs within last 365 days.  Recent Lipid Panel    Component Value Date/Time   CHOL 115 04/13/2022 1126   TRIG 125 04/13/2022 1126   HDL 41 04/13/2022 1126   CHOLHDL 2.8 04/13/2022 1126   CHOLHDL 3.2 01/24/2015 0920   VLDL 26 01/24/2015 0920   LDLCALC 52 04/13/2022 1126    History of Present Illness    77 year old female with the above past medical history including CAD s/p DES-LAD in 2013, chronic diastolic heart failure, hypertension, hyperlipidemia, osteoarthritis, and anxiety.  She was hospitalized in April 2013 in the setting of NSTEMI.  She had 3 lesions in the LAD treated with 1 stent, no other significant disease.  She has had intermittent atypical chest pain since this time.  She has had multiple stress tests, all of which have been nonischemic, most recently in 2022. Most recent echo in 2018 showed EF 55 to 60%, no RWMA, no significant valvular abnormalities. Additionally, she has a history of chronic intermittent dizziness in the setting of dehydration, poor oral intake. She was last seen in the office on 04/20/2022 and was doing well from a cardiac standpoint. She noted occasional chest discomfort with emotional stress.  She presents today for follow-up accompanied by her daughter who provides interpretation as needed per patient request.  Since her last visit she has been stable overall from a cardiac standpoint.  She and her daughter report that patient's grandson died last month due to complications from chronic illness.  Patient is tearful when discussing this in office today.  Since his death, she has noticed more frequent chest discomfort.  She has taken  nitroglycerin with some relief, denies any exertional symptoms.  He has also noted headaches with Tylenol.  She has had 2 episodes of dizziness/lightheadedness, denies any palpitations, presyncope, syncope.  Other than her recent increase in chest discomfort, overall, her symptoms have been stable.  Home Medications    Current Outpatient Medications  Medication Sig Dispense Refill   acetaminophen (TYLENOL) 325 MG tablet Take 2 tablets (650 mg total) by mouth every 6 (six) hours as needed for mild pain or headache (or Fever >/= 101). 45 tablet 0   aspirin EC 81 MG tablet Take 1 tablet (81 mg total) by mouth daily. Swallow whole. 90 tablet 3   atorvastatin (LIPITOR) 40 MG tablet Take 40 mg by mouth daily.     diclofenac Sodium (VOLTAREN) 1 % GEL Apply 1 application topically 4 (four) times daily.     esomeprazole (NEXIUM) 40 MG capsule TAKE 1 CAPSULE (40 MG TOTAL) BY MOUTH DAILY. 90 capsule 3   methylPREDNISolone (MEDROL DOSEPAK) 4 MG TBPK tablet      ondansetron (ZOFRAN ODT) 8 MG disintegrating tablet Take 1 tablet (8 mg total) by mouth every 8 (  eight) hours as needed for nausea or vomiting. 30 tablet 0   VOLTAREN 1 % GEL Apply 1 application topically 4 (four) times daily as needed (for pain).   5   isosorbide mononitrate (IMDUR) 60 MG 24 hr tablet Take 1.5 tablets (90 mg total) by mouth daily. 135 tablet 3   metoprolol succinate (TOPROL-XL) 25 MG 24 hr tablet Take 0.5 tablets (12.5 mg total) by mouth daily. 45 tablet 3   nitroGLYCERIN (NITROSTAT) 0.4 MG SL tablet PLACE 1 TABLET UNDER THE TONGUE, EVERY 5 MINUTES FOR 3 DOSES, AS NEEDED FOR CHEST PAIN. 25 tablet 3   rosuvastatin (CRESTOR) 40 MG tablet Take 1 tablet (40 mg total) by mouth daily. 90 tablet 3   No current facility-administered medications for this visit.     Review of Systems    She denies palpitations, dyspnea, pnd, orthopnea, n, v, syncope, edema, weight gain, or early satiety. All other systems reviewed and are otherwise  negative except as noted above.   Physical Exam    VS:  BP 120/70 (BP Location: Left Arm, Patient Position: Sitting, Cuff Size: Normal)   Pulse 61   Ht 4\' 9"  (1.448 m)   Wt 130 lb 9.6 oz (59.2 kg)   SpO2 98%   BMI 28.26 kg/m  GEN: Well nourished, well developed, in no acute distress. HEENT: normal. Neck: Supple, no JVD, carotid bruits, or masses. Cardiac: RRR, no murmurs, rubs, or gallops. No clubbing, cyanosis, edema.  Radials/DP/PT 2+ and equal bilaterally.  Respiratory:  Respirations regular and unlabored, clear to auscultation bilaterally. GI: Soft, nontender, nondistended, BS + x 4. MS: no deformity or atrophy. Skin: warm and dry, no rash. Neuro:  Strength and sensation are intact. Psych: Normal affect.  Accessory Clinical Findings    ECG personally reviewed by me today - EKG Interpretation Date/Time:  Thursday April 22 2023 11:54:28 EDT Ventricular Rate:  58 PR Interval:  170 QRS Duration:  82 QT Interval:  468 QTC Calculation: 459 R Axis:   56  Text Interpretation: Sinus bradycardia Septal infarct , age undetermined When compared with ECG of 15-Jul-2017 01:36, PREVIOUS ECG IS PRESENT Confirmed by Bernadene Person ([id number]) on 04/22/2023 11:57:58 AM  - no acute changes.   Lab Results  Component Value Date   WBC 8.5 04/13/2022   HGB 10.6 (L) 04/13/2022   HCT 35.6 04/13/2022   MCV 70 (L) 04/13/2022   PLT 255 04/13/2022   Lab Results  Component Value Date   CREATININE 0.58 04/13/2022   BUN 15 04/13/2022   NA 144 04/13/2022   K 4.5 04/13/2022   CL 105 04/13/2022   CO2 24 04/13/2022   Lab Results  Component Value Date   ALT 28 04/13/2022   AST 40 04/13/2022   ALKPHOS 66 04/13/2022   BILITOT 0.4 04/13/2022   Lab Results  Component Value Date   CHOL 115 04/13/2022   HDL 41 04/13/2022   LDLCALC 52 04/13/2022   TRIG 125 04/13/2022   CHOLHDL 2.8 04/13/2022    No results found for: "HGBA1C"  Assessment & Plan    1. CAD: s/p DES-LAD in 2013. She has had  multiple stress tests, all of which have been nonischemic, most recently in 2022.  Historically, her chest discomfort has been associated with emotional stress.  She recently lost her grandson.  Since this time she has had increased chest discomfort.  Will increase Imdur to 90 mg daily.  Continue to monitor symptoms.  Should she have progressive symptoms, or exertional  symptoms, consider ischemic evaluation.  Continue aspirin, metoprolol, Imdur as above, and Crestor.  2. Chronic diastolic heart failure: Most recent echo in 2018 showed EF 55 to 60%, no RWMA, no significant valvular abnormalities. Euvolemic and well compensated on exam.  Continue current medications as above.  3. Chronic dizziness: Thought to be in the setting of dehydration, poor oral intake.  Overall stable.  Encouraged adequate hydration.  4. Hypertension: BP well controlled. Continue current antihypertensive regimen.   5. Hyperlipidemia: LDL was 52 in 03/2022. Will repeat fasting lipids, CMET.  Continue Crestor.  6. Disposition: Follow-up in 3 months.       Joylene Grapes, NP 04/22/2023, 12:42 PM

## 2023-04-22 NOTE — Patient Instructions (Signed)
Medication Instructions:  Your physician has recommended you make the following change in your medication:  INCREASE Imdur (isosorbide mononitrate) 90mg  (1.5 tablet) by mouth daily.  *If you need a refill on your cardiac medications before your next appointment, please call your pharmacy*   Lab Work: Fastin lipid and CMET today.  If you have labs (blood work) drawn today and your tests are completely normal, you will receive your results only by: MyChart Message (if you have MyChart) OR A paper copy in the mail If you have any lab test that is abnormal or we need to change your treatment, we will call you to review the results.   Testing/Procedures: None   Follow-Up: At [facility], you and your health needs are our priority.  As part of our continuing mission to provide you with exceptional heart care, we have created designated Provider Care Teams.  These Care Teams include your primary Cardiologist (physician) and Advanced Practice Providers (APPs -  Physician Assistants and Nurse Practitioners) who all work together to provide you with the care you need, when you need it.  We recommend signing up for the patient portal called "MyChart".  Sign up information is provided on this After Visit Summary.  MyChart is used to connect with patients for Virtual Visits (Telemedicine).  Patients are able to view lab/test results, encounter notes, upcoming appointments, etc.  Non-urgent messages can be sent to your provider as well.   To learn more about what you can do with MyChart, go to [email or web address].    Your next appointment:   3 month(s)  Provider:   Bernadene Person, NP

## 2023-07-21 ENCOUNTER — Telehealth: Payer: Self-pay | Admitting: Nurse Practitioner

## 2023-07-21 ENCOUNTER — Ambulatory Visit: Payer: 59 | Attending: Nurse Practitioner | Admitting: Nurse Practitioner

## 2023-07-21 ENCOUNTER — Encounter: Payer: Self-pay | Admitting: Nurse Practitioner

## 2023-07-21 VITALS — BP 108/62 | HR 60 | Ht 62.0 in | Wt 131.0 lb

## 2023-07-21 DIAGNOSIS — I2510 Atherosclerotic heart disease of native coronary artery without angina pectoris: Secondary | ICD-10-CM | POA: Diagnosis not present

## 2023-07-21 DIAGNOSIS — I10 Essential (primary) hypertension: Secondary | ICD-10-CM | POA: Diagnosis not present

## 2023-07-21 DIAGNOSIS — I5032 Chronic diastolic (congestive) heart failure: Secondary | ICD-10-CM

## 2023-07-21 DIAGNOSIS — R42 Dizziness and giddiness: Secondary | ICD-10-CM | POA: Diagnosis not present

## 2023-07-21 DIAGNOSIS — E785 Hyperlipidemia, unspecified: Secondary | ICD-10-CM

## 2023-07-21 MED ORDER — RANOLAZINE ER 500 MG PO TB12: 500.0000 mg | ORAL_TABLET | Freq: Two times a day (BID) | ORAL | 3 refills | Status: DC

## 2023-07-21 NOTE — Telephone Encounter (Signed)
Tried calling back number from routed encounter but kaylen did not answer. Another daughter of pt;s answered and stated that the contact information for Ms. Mcsweeney needs to be updated. Informed her that they would need to fill out a new DPR form. Caller said they would come back today and complete new form.

## 2023-07-21 NOTE — Patient Instructions (Signed)
Medication Instructions:  Stop Imdur (Isosorbide Mononitrate) as directed. Start Ranexa 500 mg twice daily   *If you need a refill on your cardiac medications before your next appointment, please call your pharmacy*   Lab Work: NONE ordered at this time of appointment    Testing/Procedures: NONE ordered at this time of appointment     Follow-Up: At [facility], you and your health needs are our priority.  As part of our continuing mission to provide you with exceptional heart care, we have created designated Provider Care Teams.  These Care Teams include your primary Cardiologist (physician) and Advanced Practice Providers (APPs -  Physician Assistants and Nurse Practitioners) who all work together to provide you with the care you need, when you need it.  We recommend signing up for the patient portal called "MyChart".  Sign up information is provided on this After Visit Summary.  MyChart is used to connect with patients for Virtual Visits (Telemedicine).  Patients are able to view lab/test results, encounter notes, upcoming appointments, etc.  Non-urgent messages can be sent to your provider as well.   To learn more about what you can do with MyChart, go to [email or web address].    Your next appointment:    3 months Bernadene Person NP) 6 months (Dr. Herbie Baltimore)  Provider:   Bryan Lemma, MD  or Bernadene Person, NP        Other Instructions Please call with any worsening symptoms of shortness of breath, chest pain and dizziness

## 2023-07-21 NOTE — Telephone Encounter (Signed)
Pt daughter called in today, she would like an update on her mother's condition and how the appt went today. Please advise.

## 2023-07-21 NOTE — Progress Notes (Signed)
Office Visit    Patient Name: Tammy Murillo Date of Encounter: 07/21/2023  Primary Care Provider:  Fleet Contras, MD Primary Cardiologist:  Bryan Lemma, MD  Chief Complaint    77 year old female with a history of CAD s/p DES-LAD in 2013, chronic diastolic heart failure, hypertension, hyperlipidemia, osteoarthritis, and anxiety who presents for follow-up related to CAD.   Past Medical History    Past Medical History:  Diagnosis Date   Anxiety    Borderline hypertension    CAD S/P percutaneous coronary angioplasty 12/31/2011   3 sequential LAD lesions treated with a single Promus Element DES; - Myoview 02/2016: No infarct or ischemia. EF 60-65%.   Costochondritis    Dyslipidemia, goal LDL below 70    H/O echocardiogram April 2014   Normal EF: 55-60%, no regional WMA, grade 1 diastolic dysfunction. Mild MR   History of colon cancer  2010   History of: Non-ST elevation MI (NSTEMI) 12/31/2011   Severe LAD lesions: Sequential 90%, 70% and 60%; no other significant stenoses. She had had a normal Myoview in 2012 AS WELL AS POST-PCI IN 2017   Osteoarthritis    Presence of drug coated stent in LAD coronary artery 01/01/2012   Promus Element DES 2.75 mm x38 mm postdilated to 3.0 mm   Past Surgical History:  Procedure Laterality Date   CHOLECYSTECTOMY  2004   LEFT HEART CATHETERIZATION WITH CORONARY ANGIOGRAM N/A 01/01/2012   Procedure: LEFT HEART CATHETERIZATION WITH CORONARY ANGIOGRAM;  Surgeon: Lennette Bihari, MD;  Location: [facility] CATH LAB;  Service: Cardiovascular;  Laterality: N/A;   NM MYOVIEW LTD  02/2016    LOW RISK study. No ischemia or infarction. EF 60-65%.   NM MYOVIEW LTD  03/06/2021   Lexiscan Myoview: EF 55 to 60%.  No ischemia or infarction.  Normal wall motion.  LOW RISK.   PERCUTANEOUS CORONARY STENT INTERVENTION (PCI-S)  01/01/2012   Procedure: PERCUTANEOUS CORONARY STENT INTERVENTION (PCI-S);  Surgeon: Lennette Bihari, MD;  Location: [facility] CATH LAB;  Service:  Cardiovascula: LAD PCI: Promus Element DES 2.75 MM by 30 MM, postdilated to 3.0 MM   TRANSTHORACIC ECHOCARDIOGRAM  06/2017   EF 55-60%. Grade 1 diastolic dysfunction. Mild MR. no RWMA    Allergies  No Known Allergies   Labs/Other Studies Reviewed    The following studies were reviewed today:  Cardiac Studies & Procedures     STRESS TESTS  MYOCARDIAL PERFUSION IMAGING 03/06/2021  Narrative  The left ventricular ejection fraction is normal (55-65%).  Nuclear stress EF: 59%.  There was no ST segment deviation noted during stress.  The study is normal.  This is a low risk study.  No ischemia or infarction on perfusion images. Normal wall motion.   ECHOCARDIOGRAM  ECHOCARDIOGRAM COMPLETE 07/15/2017  Narrative *[name]* *[name]* [address] [place], [place] [address] [phone]  ------------------------------------------------------------------- Transthoracic Echocardiography  Patient:    Tammy, Murillo MR #:       [id number] Study Date: 07/15/2017 Gender:     F Age:        71 Height:     149.9 cm Weight:     61.8 kg BSA:        1.62 m^2 Pt. Status: Room:       1406  PERFORMING   W. Viann Fish, MD [facility]    [name] [address]    [name] [id number] SONOGRAPHER  [name], RDCS  ATTENDING    Elgergawy, Dawood S  cc:  ------------------------------------------------------------------- LV EF: 55% -   60%  ------------------------------------------------------------------- Indications:      429.3 Cardiomegaly.  ------------------------------------------------------------------- History:   PMH:   Coronary artery disease.  Risk factors: Dyslipidemia.  ------------------------------------------------------------------- Study Conclusions  - Left ventricle: The cavity size was normal. Systolic function was normal. The estimated ejection fraction was  in the range of 55% to 60%. Wall motion was normal; there were no regional wall motion abnormalities. - Left atrium: The atrium was mildly dilated.  ------------------------------------------------------------------- Study data:   Study status:  Routine.  Study completion:  There were no complications.          Transthoracic echocardiography. M-mode, complete 2D, spectral Doppler, and color Doppler. Birthdate:  Patient birthdate: 1945-10-04.  Age:  Patient is 77 yr old.  Sex:  Gender: female.    BMI: 27.5 kg/m^2.  Blood pressure: 114/81  Patient status:  Inpatient.  Study date:  Study date: 07/15/2017. Study time: 09:52 AM.  Location:  Bedside.  -------------------------------------------------------------------  ------------------------------------------------------------------- Left ventricle:  The cavity size was normal. Systolic function was normal. The estimated ejection fraction was in the range of 55% to 60%. Wall motion was normal; there were no regional wall motion abnormalities.  ------------------------------------------------------------------- Aortic valve:   Trileaflet; normal thickness leaflets. Mobility was not restricted.  Doppler:  Transvalvular velocity was within the normal range. There was no stenosis. There was no regurgitation.  ------------------------------------------------------------------- Aorta:  Aortic root: The aortic root was normal in size.  ------------------------------------------------------------------- Mitral valve:   Structurally normal valve.   Mobility was not restricted.  Doppler:  Transvalvular velocity was within the normal range. There was no evidence for stenosis. There was trivial regurgitation.    Peak gradient (D): 5 mm Hg.  ------------------------------------------------------------------- Left atrium:  The atrium was mildly dilated.  ------------------------------------------------------------------- Right ventricle:  The  cavity size was normal. Wall thickness was normal. Systolic function was normal.  ------------------------------------------------------------------- Pulmonic valve:   Not well visualized.  The valve appears to be grossly normal.    Doppler:  Transvalvular velocity was within the normal range. There was no evidence for stenosis.  ------------------------------------------------------------------- Tricuspid valve:   Structurally normal valve.    Doppler: Transvalvular velocity was within the normal range. There was mild regurgitation.  ------------------------------------------------------------------- Pulmonary artery:   The main pulmonary artery was normal-sized. Systolic pressure was within the normal range.  ------------------------------------------------------------------- Right atrium:  The atrium was normal in size.  ------------------------------------------------------------------- Pericardium:  There was no pericardial effusion.  ------------------------------------------------------------------- Systemic veins: Inferior vena cava: The vessel was normal in size.  ------------------------------------------------------------------- Measurements  Left ventricle                           Value        Reference LV ID, ED, PLAX chordal                  47.9  mm     43 - 52 LV ID, ES, PLAX chordal                  33.1  mm     23 - 38 LV fx shortening, PLAX chordal           31    %      >=29 LV PW thickness, ED  10.6  mm     --------- IVS/LV PW ratio, ED                      0.97         <=1.3 Stroke volume, 2D                        51    ml     --------- Stroke volume/bsa, 2D                    31    ml/m^2 --------- LV e&', lateral                           6.31  cm/s   --------- LV E/e&', lateral                         16.96        --------- LV e&', medial                            6.53  cm/s   --------- LV E/e&', medial                           16.39        --------- LV e&', average                           6.42  cm/s   --------- LV E/e&', average                         16.67        ---------  Ventricular septum                       Value        Reference IVS thickness, ED                        10.3  mm     ---------  LVOT                                     Value        Reference LVOT ID, S                               17    mm     --------- LVOT area                                2.27  cm^2   --------- LVOT peak velocity, S                    85    cm/s   --------- LVOT mean velocity, S                    61.2  cm/s   --------- LVOT VTI, S  22.4  cm     ---------  Aorta                                    Value        Reference Aortic root ID, ED                       33    mm     ---------  Left atrium                              Value        Reference LA ID, A-P, ES                           32    mm     --------- LA ID/bsa, A-P                           1.97  cm/m^2 <=2.2 LA volume, S                             44.3  ml     --------- LA volume/bsa, S                         27.3  ml/m^2 --------- LA volume, ES, 1-p A4C                   25.5  ml     --------- LA volume/bsa, ES, 1-p A4C               15.7  ml/m^2 --------- LA volume, ES, 1-p A2C                   75    ml     --------- LA volume/bsa, ES, 1-p A2C               46.2  ml/m^2 ---------  Mitral valve                             Value        Reference Mitral E-wave peak velocity              107   cm/s   --------- Mitral A-wave peak velocity              128   cm/s   --------- Mitral deceleration time                 211   ms     150 - 230 Mitral peak gradient, D                  5     mm Hg  --------- Mitral E/A ratio, peak                   0.8          ---------  Right ventricle                          Value  Reference TAPSE                                    25.6  mm     --------- RV s&', lateral, S                         12.2  cm/s   ---------  Legend: (L)  and  (H)  mark values outside specified reference range.  ------------------------------------------------------------------- Prepared and Electronically Authenticated by  Georga Hacking, MD [facility] 2018-10-18T13:31:42            Recent Labs: 04/22/2023: ALT 45; BUN 11; Creatinine, Ser 0.56; Potassium 4.6; Sodium 143  Recent Lipid Panel    Component Value Date/Time   CHOL 121 04/22/2023 1306   TRIG 131 04/22/2023 1306   HDL 39 (L) 04/22/2023 1306   CHOLHDL 3.1 04/22/2023 1306   CHOLHDL 3.2 01/24/2015 0920   VLDL 26 01/24/2015 0920   LDLCALC 59 04/22/2023 1306    History of Present Illness    77 year old female with the above past medical history including CAD s/p DES-LAD in 2013, chronic diastolic heart failure, hypertension, hyperlipidemia, osteoarthritis, and anxiety.   She was hospitalized in April 2013 in the setting of NSTEMI.  She had 3 lesions in the LAD treated with 1 stent, no other significant disease.  She has had intermittent atypical chest pain since this time.  She has had multiple stress tests, all of which have been nonischemic, most recently in 2022. Most recent echo in 2018 showed EF 55 to 60%, no RWMA, no significant valvular abnormalities. Additionally, she has a history of chronic intermittent dizziness in the setting of dehydration, poor oral intake. She was last seen in the office on 04/22/2023 and was stable from a cardiac standpoint. She noted occasional chest discomfort in the setting of significant emotional stress (her grandson died 1 months prior). Imdur was increased to 90 mg daily.    She presents today for follow-up accompanied by her daughter who provides interpretation as needed per patient request. Since her last visit she has been stable overall from a cardiac standpoint. She continues to note chest pain at rest, most often associated with emotional stress.  She also notes some mild chest discomfort with  bending and with heavy exertion.  She saw no improvement in her chest discomfort with increased Imdur.  She reports stable intermittent mild dizziness, denies presyncope or syncope.  She also notes daily headaches, relieved with Tylenol.  She is hoping to travel to [place] in February 2025.   Home Medications    Current Outpatient Medications  Medication Sig Dispense Refill   acetaminophen (TYLENOL) 325 MG tablet Take 2 tablets (650 mg total) by mouth every 6 (six) hours as needed for mild pain or headache (or Fever >/= 101). 45 tablet 0   aspirin EC 81 MG tablet Take 1 tablet (81 mg total) by mouth daily. Swallow whole. 90 tablet 3   atorvastatin (LIPITOR) 40 MG tablet Take 40 mg by mouth daily.     diclofenac Sodium (VOLTAREN) 1 % GEL Apply 1 application topically 4 (four) times daily.     esomeprazole (NEXIUM) 40 MG capsule TAKE 1 CAPSULE (40 MG TOTAL) BY MOUTH DAILY. 90 capsule 3   isosorbide mononitrate (IMDUR) 60 MG 24 hr tablet Take 1.5 tablets (90 mg total) by mouth daily. 135 tablet 3   methylPREDNISolone (  MEDROL DOSEPAK) 4 MG TBPK tablet      metoprolol succinate (TOPROL-XL) 25 MG 24 hr tablet Take 0.5 tablets (12.5 mg total) by mouth daily. 45 tablet 3   nitroGLYCERIN (NITROSTAT) 0.4 MG SL tablet PLACE 1 TABLET UNDER THE TONGUE, EVERY 5 MINUTES FOR 3 DOSES, AS NEEDED FOR CHEST PAIN. 25 tablet 3   ondansetron (ZOFRAN ODT) 8 MG disintegrating tablet Take 1 tablet (8 mg total) by mouth every 8 (eight) hours as needed for nausea or vomiting. 30 tablet 0   ranolazine (RANEXA) 500 MG 12 hr tablet Take 1 tablet (500 mg total) by mouth 2 (two) times daily. 180 tablet 3   rosuvastatin (CRESTOR) 40 MG tablet Take 1 tablet (40 mg total) by mouth daily. 90 tablet 3   VOLTAREN 1 % GEL Apply 1 application topically 4 (four) times daily as needed (for pain).   5   No current facility-administered medications for this visit.     Review of Systems    She denies palpitations, dyspnea, pnd,  orthopnea, n, v, dizziness, syncope, edema, weight gain, or early satiety. All other systems reviewed and are otherwise negative except as noted above.   Physical Exam    VS:  BP 108/62   Pulse 60   Ht 5\' 2"  (1.575 m)   Wt 131 lb (59.4 kg)   SpO2 96%   BMI 23.96 kg/m   GEN: Well nourished, well developed, in no acute distress. HEENT: normal. Neck: Supple, no JVD, carotid bruits, or masses. Cardiac: RRR, no murmurs, rubs, or gallops. No clubbing, cyanosis, edema.  Radials/DP/PT 2+ and equal bilaterally.  Respiratory:  Respirations regular and unlabored, clear to auscultation bilaterally. GI: Soft, nontender, nondistended, BS + x 4. MS: no deformity or atrophy. Skin: warm and dry, no rash. Neuro:  Strength and sensation are intact. Psych: Normal affect.  Accessory Clinical Findings    ECG personally reviewed by me today -    - no EKG in office today.   Lab Results  Component Value Date   WBC 8.5 04/13/2022   HGB 10.6 (L) 04/13/2022   HCT 35.6 04/13/2022   MCV 70 (L) 04/13/2022   PLT 255 04/13/2022   Lab Results  Component Value Date   CREATININE 0.56 (L) 04/22/2023   BUN 11 04/22/2023   NA 143 04/22/2023   K 4.6 04/22/2023   CL 104 04/22/2023   CO2 24 04/22/2023   Lab Results  Component Value Date   ALT 45 (H) 04/22/2023   AST 53 (H) 04/22/2023   ALKPHOS 83 04/22/2023   BILITOT 0.3 04/22/2023   Lab Results  Component Value Date   CHOL 121 04/22/2023   HDL 39 (L) 04/22/2023   LDLCALC 59 04/22/2023   TRIG 131 04/22/2023   CHOLHDL 3.1 04/22/2023    No results found for: "HGBA1C"  Assessment & Plan    1. CAD: S/p DES-LAD in 2013. She has had multiple stress tests, all of which have been nonischemic, most recently in 2022.  Historically, her chest discomfort has been associated with emotional stress.  She continues to have intermittent chest discomfort despite increased Imdur dosing.  She also notes some mild chest discomfort with bending and with heavy  exertion.  We discussed possible ischemic evaluation with cardiac PET stress test, however, she is hesitant to proceed and declines today.  She notes daily headaches. Question whether or not Imdur could be contributing to her headaches.  Will stop Imdur and start Ranexa 500 mg twice daily.  Continue to monitor symptoms.  I advised her to report any worsening chest pain, dizziness.  Additionally, she will also notify us if her headaches do not resolve with discontinuation of Imdur. Continue aspirin, metoprolol, and Crestor.  2. Chronic diastolic heart failure: Most recent echo in 2018 showed EF 55 to 60%, no RWMA, no significant valvular abnormalities. Euvolemic and well compensated on exam.  Continue current medications as above.   3. Chronic dizziness: Thought to be in the setting of dehydration, poor oral intake.  Overall stable.  Encouraged adequate hydration.   4. Hypertension: BP well controlled. Continue current antihypertensive regimen.    5. Hyperlipidemia: LDL was 59 in 03/2023. Continue Crestor.   6. Disposition:  Follow-up in 3 months with APP, follow-up in 6 months with Dr. Herbie Baltimore.      Joylene Grapes, NP 07/21/2023, 9:51 AM

## 2023-07-23 ENCOUNTER — Ambulatory Visit: Payer: 59 | Admitting: Nurse Practitioner

## 2023-07-28 ENCOUNTER — Other Ambulatory Visit: Payer: Self-pay

## 2023-07-28 ENCOUNTER — Telehealth: Payer: Self-pay | Admitting: Cardiology

## 2023-07-28 MED ORDER — ISOSORBIDE MONONITRATE ER 30 MG PO TB24: 30.0000 mg | ORAL_TABLET | Freq: Every day | ORAL | 3 refills | Status: DC

## 2023-07-28 NOTE — Telephone Encounter (Signed)
Called and spoke to patient's daughter who states patient has been tired and dizzy for the last 2 days. She reports she does not want to get out of bed much. She feels like it is coming from the Ranexa that was recently started. She deny any other symptoms at this time. She wanted to have her seen sooner than her appt on 1/29. She was scheduled for 11/25 with Bernadene Person, NP.

## 2023-07-28 NOTE — Telephone Encounter (Signed)
Spoke with patient's daughter and relayed message below. Patient had stopped taking Imdur prior to taking Renexa. New prescription for Imdur 30 mg sent to [facility] as requested. Recommend she start Imdur tomorrow since she already had Remexa today. She verbalized understanding and agree.        Okay to stop Ranexa in this case.  She was supposed to stop the Imdur prior to starting Ranexa.  Please confirm that she did so. That said, if we are going to stop Ranexa at this time, I would recommend she restart Imdur at a lower dose, can try 30 mg daily.  Continue to monitor BP and symptoms.  Thank you-EM

## 2023-07-28 NOTE — Telephone Encounter (Signed)
STAT if patient feels like he/she is going to faint   Are you dizzy now? No  Do you feel faint or have you passed out? No   Do you have any other symptoms? Very weak and tired   Have you checked your HR and BP (record if available)? No

## 2023-07-28 NOTE — Telephone Encounter (Signed)
Attempted to call daughter with no answer. Left message to return call.

## 2023-08-23 ENCOUNTER — Encounter: Payer: Self-pay | Admitting: Nurse Practitioner

## 2023-08-23 ENCOUNTER — Ambulatory Visit: Payer: 59 | Attending: Nurse Practitioner | Admitting: Nurse Practitioner

## 2023-08-23 VITALS — BP 110/64 | HR 62 | Ht 62.0 in | Wt 132.0 lb

## 2023-08-23 DIAGNOSIS — I5032 Chronic diastolic (congestive) heart failure: Secondary | ICD-10-CM | POA: Diagnosis not present

## 2023-08-23 DIAGNOSIS — I25118 Atherosclerotic heart disease of native coronary artery with other forms of angina pectoris: Secondary | ICD-10-CM | POA: Diagnosis not present

## 2023-08-23 DIAGNOSIS — I10 Essential (primary) hypertension: Secondary | ICD-10-CM | POA: Diagnosis not present

## 2023-08-23 DIAGNOSIS — R42 Dizziness and giddiness: Secondary | ICD-10-CM

## 2023-08-23 DIAGNOSIS — E785 Hyperlipidemia, unspecified: Secondary | ICD-10-CM

## 2023-08-23 MED ORDER — RANOLAZINE ER 500 MG PO TB12: ORAL_TABLET | ORAL | 3 refills | Status: DC

## 2023-08-23 NOTE — Progress Notes (Signed)
Office Visit    Patient Name: Tammy Murillo Date of Encounter: 08/23/2023  Primary Care Provider:  Fleet Contras, MD Primary Cardiologist:  Bryan Lemma, MD  Chief Complaint    77 year old female with a history of CAD s/p DES-LAD in 2013, chronic diastolic heart failure, hypertension, hyperlipidemia, osteoarthritis, and anxiety who presents for follow-up related to CAD.   Past Medical History    Past Medical History:  Diagnosis Date   Anxiety    Borderline hypertension    CAD S/P percutaneous coronary angioplasty 12/31/2011   3 sequential LAD lesions treated with a single Promus Element DES; - Myoview 02/2016: No infarct or ischemia. EF 60-65%.   Costochondritis    Dyslipidemia, goal LDL below 70    H/O echocardiogram April 2014   Normal EF: 55-60%, no regional WMA, grade 1 diastolic dysfunction. Mild MR   History of colon cancer  2010   History of: Non-ST elevation MI (NSTEMI) 12/31/2011   Severe LAD lesions: Sequential 90%, 70% and 60%; no other significant stenoses. She had had a normal Myoview in 2012 AS WELL AS POST-PCI IN 2017   Osteoarthritis    Presence of drug coated stent in LAD coronary artery 01/01/2012   Promus Element DES 2.75 mm x38 mm postdilated to 3.0 mm   Past Surgical History:  Procedure Laterality Date   CHOLECYSTECTOMY  2004   LEFT HEART CATHETERIZATION WITH CORONARY ANGIOGRAM N/A 01/01/2012   Procedure: LEFT HEART CATHETERIZATION WITH CORONARY ANGIOGRAM;  Surgeon: Lennette Bihari, MD;  Location: [facility] CATH LAB;  Service: Cardiovascular;  Laterality: N/A;   NM MYOVIEW LTD  02/2016    LOW RISK study. No ischemia or infarction. EF 60-65%.   NM MYOVIEW LTD  03/06/2021   Lexiscan Myoview: EF 55 to 60%.  No ischemia or infarction.  Normal wall motion.  LOW RISK.   PERCUTANEOUS CORONARY STENT INTERVENTION (PCI-S)  01/01/2012   Procedure: PERCUTANEOUS CORONARY STENT INTERVENTION (PCI-S);  Surgeon: Lennette Bihari, MD;  Location: [facility] CATH LAB;  Service:  Cardiovascula: LAD PCI: Promus Element DES 2.75 MM by 30 MM, postdilated to 3.0 MM   TRANSTHORACIC ECHOCARDIOGRAM  06/2017   EF 55-60%. Grade 1 diastolic dysfunction. Mild MR. no RWMA    Allergies  No Known Allergies   Labs/Other Studies Reviewed    The following studies were reviewed today:  Cardiac Studies & Procedures     STRESS TESTS  MYOCARDIAL PERFUSION IMAGING 03/06/2021  Narrative  The left ventricular ejection fraction is normal (55-65%).  Nuclear stress EF: 59%.  There was no ST segment deviation noted during stress.  The study is normal.  This is a low risk study.  No ischemia or infarction on perfusion images. Normal wall motion.   ECHOCARDIOGRAM  ECHOCARDIOGRAM COMPLETE 07/15/2017  Narrative *[place]* *[facility]* [address] [place], [place] [address] [phone]  ------------------------------------------------------------------- Transthoracic Echocardiography  Patient:    Tammy Murillo MR #:       [id number] Study Date: 07/15/2017 Gender:     F Age:        71 Height:     149.9 cm Weight:     61.8 kg BSA:        1.62 m^2 Pt. Status: Room:       1406  PERFORMING   W. Viann Fish, MD [facility]    [name] [address]    [name] [id number] SONOGRAPHER  [name], RDCS  ATTENDING    Elgergawy, Dawood S  cc:  ------------------------------------------------------------------- LV EF: 55% -   60%  ------------------------------------------------------------------- Indications:      429.3 Cardiomegaly.  ------------------------------------------------------------------- History:   PMH:   Coronary artery disease.  Risk factors: Dyslipidemia.  ------------------------------------------------------------------- Study Conclusions  - Left ventricle: The cavity size was normal. Systolic function was normal. The estimated ejection fraction was  in the range of 55% to 60%. Wall motion was normal; there were no regional wall motion abnormalities. - Left atrium: The atrium was mildly dilated.  ------------------------------------------------------------------- Study data:   Study status:  Routine.  Study completion:  There were no complications.          Transthoracic echocardiography. M-mode, complete 2D, spectral Doppler, and color Doppler. Birthdate:  Patient birthdate: 02-11-1946.  Age:  Patient is 77 yr old.  Sex:  Gender: female.    BMI: 27.5 kg/m^2.  Blood pressure: 114/81  Patient status:  Inpatient.  Study date:  Study date: 07/15/2017. Study time: 09:52 AM.  Location:  Bedside.  -------------------------------------------------------------------  ------------------------------------------------------------------- Left ventricle:  The cavity size was normal. Systolic function was normal. The estimated ejection fraction was in the range of 55% to 60%. Wall motion was normal; there were no regional wall motion abnormalities.  ------------------------------------------------------------------- Aortic valve:   Trileaflet; normal thickness leaflets. Mobility was not restricted.  Doppler:  Transvalvular velocity was within the normal range. There was no stenosis. There was no regurgitation.  ------------------------------------------------------------------- Aorta:  Aortic root: The aortic root was normal in size.  ------------------------------------------------------------------- Mitral valve:   Structurally normal valve.   Mobility was not restricted.  Doppler:  Transvalvular velocity was within the normal range. There was no evidence for stenosis. There was trivial regurgitation.    Peak gradient (D): 5 mm Hg.  ------------------------------------------------------------------- Left atrium:  The atrium was mildly dilated.  ------------------------------------------------------------------- Right ventricle:  The  cavity size was normal. Wall thickness was normal. Systolic function was normal.  ------------------------------------------------------------------- Pulmonic valve:   Not well visualized.  The valve appears to be grossly normal.    Doppler:  Transvalvular velocity was within the normal range. There was no evidence for stenosis.  ------------------------------------------------------------------- Tricuspid valve:   Structurally normal valve.    Doppler: Transvalvular velocity was within the normal range. There was mild regurgitation.  ------------------------------------------------------------------- Pulmonary artery:   The main pulmonary artery was normal-sized. Systolic pressure was within the normal range.  ------------------------------------------------------------------- Right atrium:  The atrium was normal in size.  ------------------------------------------------------------------- Pericardium:  There was no pericardial effusion.  ------------------------------------------------------------------- Systemic veins: Inferior vena cava: The vessel was normal in size.  ------------------------------------------------------------------- Measurements  Left ventricle                           Value        Reference LV ID, ED, PLAX chordal                  47.9  mm     43 - 52 LV ID, ES, PLAX chordal                  33.1  mm     23 - 38 LV fx shortening, PLAX chordal           31    %      >=29 LV PW thickness, ED  10.6  mm     --------- IVS/LV PW ratio, ED                      0.97         <=1.3 Stroke volume, 2D                        51    ml     --------- Stroke volume/bsa, 2D                    31    ml/m^2 --------- LV e&', lateral                           6.31  cm/s   --------- LV E/e&', lateral                         16.96        --------- LV e&', medial                            6.53  cm/s   --------- LV E/e&', medial                           16.39        --------- LV e&', average                           6.42  cm/s   --------- LV E/e&', average                         16.67        ---------  Ventricular septum                       Value        Reference IVS thickness, ED                        10.3  mm     ---------  LVOT                                     Value        Reference LVOT ID, S                               17    mm     --------- LVOT area                                2.27  cm^2   --------- LVOT peak velocity, S                    85    cm/s   --------- LVOT mean velocity, S                    61.2  cm/s   --------- LVOT VTI, S  22.4  cm     ---------  Aorta                                    Value        Reference Aortic root ID, ED                       33    mm     ---------  Left atrium                              Value        Reference LA ID, A-P, ES                           32    mm     --------- LA ID/bsa, A-P                           1.97  cm/m^2 <=2.2 LA volume, S                             44.3  ml     --------- LA volume/bsa, S                         27.3  ml/m^2 --------- LA volume, ES, 1-p A4C                   25.5  ml     --------- LA volume/bsa, ES, 1-p A4C               15.7  ml/m^2 --------- LA volume, ES, 1-p A2C                   75    ml     --------- LA volume/bsa, ES, 1-p A2C               46.2  ml/m^2 ---------  Mitral valve                             Value        Reference Mitral E-wave peak velocity              107   cm/s   --------- Mitral A-wave peak velocity              128   cm/s   --------- Mitral deceleration time                 211   ms     150 - 230 Mitral peak gradient, D                  5     mm Hg  --------- Mitral E/A ratio, peak                   0.8          ---------  Right ventricle                          Value  Reference TAPSE                                    25.6  mm     --------- RV s&', lateral, S                         12.2  cm/s   ---------  Legend: (L)  and  (H)  mark values outside specified reference range.  ------------------------------------------------------------------- Prepared and Electronically Authenticated by  Georga Hacking, MD [facility] 2018-10-18T13:31:42            Recent Labs: 04/22/2023: ALT 45; BUN 11; Creatinine, Ser 0.56; Potassium 4.6; Sodium 143  Recent Lipid Panel    Component Value Date/Time   CHOL 121 04/22/2023 1306   TRIG 131 04/22/2023 1306   HDL 39 (L) 04/22/2023 1306   CHOLHDL 3.1 04/22/2023 1306   CHOLHDL 3.2 01/24/2015 0920   VLDL 26 01/24/2015 0920   LDLCALC 59 04/22/2023 1306    History of Present Illness    77 year old female with the above past medical history including CAD s/p DES-LAD in 2013, chronic diastolic heart failure, hypertension, hyperlipidemia, osteoarthritis, and anxiety.   She was hospitalized in April 2013 in the setting of NSTEMI.  She had 3 lesions in the LAD treated with 1 stent, no other significant disease.  She has had intermittent atypical chest pain since this time.  She has had multiple stress tests, all of which have been nonischemic, most recently in 2022. Most recent echo in 2018 showed EF 55 to 60%, no RWMA, no significant valvular abnormalities. Additionally, she has a history of chronic intermittent dizziness in the setting of dehydration, poor oral intake. She was last seen in the office on 07/21/2023 and was stable from a cardiac standpoint.  And was doing well from a cardiac standpoint. She noted occasional chest discomfort with emotional stress [place], or heavy exertion.  She also noted daily headaches in the setting of Imdur, relieved with Tylenol.  She declined stress test.  It was discontinued and she was started on Ranexa 500 mg twice daily.  She contacted our office on 07/28/2023 with concern for dizziness, weakness, fatigue.  Ranexa was discontinued and Imdur 30 mg daily was resumed.  She presents today for  follow-up accompanied by her daughter who provides interpretation per patient request.  Since her last visit she has been stable from a cardiac standpoint.  She continues to note intermittent fleeting chest discomfort, fleeting shortness of breath in the setting of emotional distress, mild dizziness and frequent headaches.  She never stopped taking her Imdur as previously instructed.  She has been taking a half a tablet of Ranexa twice daily and has noticed some improvement in her fatigue and lightheadedness.  She denies palpitations, presyncope, syncope.  She continues to note significant emotional distress following the death of her grandson.  Home Medications    Current Outpatient Medications  Medication Sig Dispense Refill   acetaminophen (TYLENOL) 325 MG tablet Take 2 tablets (650 mg total) by mouth every 6 (six) hours as needed for mild pain or headache (or Fever >/= 101). 45 tablet 0   aspirin EC 81 MG tablet Take 1 tablet (81 mg total) by mouth daily. Swallow whole. 90 tablet 3   atorvastatin (LIPITOR) 40 MG tablet Take 40 mg by mouth daily.     diclofenac Sodium (VOLTAREN) 1 % GEL  Apply 1 application topically 4 (four) times daily.     esomeprazole (NEXIUM) 40 MG capsule TAKE 1 CAPSULE (40 MG TOTAL) BY MOUTH DAILY. 90 capsule 3   methylPREDNISolone (MEDROL DOSEPAK) 4 MG TBPK tablet      metoprolol succinate (TOPROL-XL) 25 MG 24 hr tablet Take 0.5 tablets (12.5 mg total) by mouth daily. 45 tablet 3   nitroGLYCERIN (NITROSTAT) 0.4 MG SL tablet PLACE 1 TABLET UNDER THE TONGUE, EVERY 5 MINUTES FOR 3 DOSES, AS NEEDED FOR CHEST PAIN. 25 tablet 3   ondansetron (ZOFRAN ODT) 8 MG disintegrating tablet Take 1 tablet (8 mg total) by mouth every 8 (eight) hours as needed for nausea or vomiting. 30 tablet 0   ranolazine (RANEXA) 500 MG 12 hr tablet Take 1/2 tablet twice daily as directed 45 tablet 3   rosuvastatin (CRESTOR) 40 MG tablet Take 1 tablet (40 mg total) by mouth daily. 90 tablet 3   VOLTAREN  1 % GEL Apply 1 application topically 4 (four) times daily as needed (for pain).   5   No current facility-administered medications for this visit.     Review of Systems    She denies palpitations, dyspnea, pnd, orthopnea, n, v, syncope, edema, weight gain, or early satiety. All other systems reviewed and are otherwise negative except as noted above.   Physical Exam    VS:  BP 110/64   Pulse 62   Ht 5\' 2"  (1.575 m)   Wt 132 lb (59.9 kg)   SpO2 96%   BMI 24.14 kg/m  GEN: Well nourished, well developed, in no acute distress. HEENT: normal. Neck: Supple, no JVD, carotid bruits, or masses. Cardiac: RRR, no murmurs, rubs, or gallops. No clubbing, cyanosis, edema.  Radials/DP/PT 2+ and equal bilaterally.  Respiratory:  Respirations regular and unlabored, clear to auscultation bilaterally. GI: Soft, nontender, nondistended, BS + x 4. MS: no deformity or atrophy. Skin: warm and dry, no rash. Neuro:  Strength and sensation are intact. Psych: Normal affect.  Accessory Clinical Findings    ECG personally reviewed by me today - EKG Interpretation Date/Time:  Monday August 23 2023 14:31:06 EST Ventricular Rate:  62 PR Interval:  166 QRS Duration:  80 QT Interval:  448 QTC Calculation: 454 R Axis:   6  Text Interpretation: Normal sinus rhythm Nonspecific ST and T wave abnormality No significant change since last tracing Confirmed by Bernadene Person ([id number]) on 08/23/2023 2:49:55 PM  - no acute changes.   Lab Results  Component Value Date   WBC 8.5 04/13/2022   HGB 10.6 (L) 04/13/2022   HCT 35.6 04/13/2022   MCV 70 (L) 04/13/2022   PLT 255 04/13/2022   Lab Results  Component Value Date   CREATININE 0.56 (L) 04/22/2023   BUN 11 04/22/2023   NA 143 04/22/2023   K 4.6 04/22/2023   CL 104 04/22/2023   CO2 24 04/22/2023   Lab Results  Component Value Date   ALT 45 (H) 04/22/2023   AST 53 (H) 04/22/2023   ALKPHOS 83 04/22/2023   BILITOT 0.3 04/22/2023   Lab Results   Component Value Date   CHOL 121 04/22/2023   HDL 39 (L) 04/22/2023   LDLCALC 59 04/22/2023   TRIG 131 04/22/2023   CHOLHDL 3.1 04/22/2023    No results found for: "HGBA1C"  Assessment & Plan     1. CAD: S/p DES-LAD in 2013. She has had multiple stress tests, all of which have been nonischemic, most recently in 2022.  Historically, her chest discomfort has been associated with emotional stress.  She continues to have intermittent chest discomfort despite increased Imdur dosing.  Unfortunately, she notes daily headaches.  Recently advised to stop Imdur and start Ranexa, however, she never stopped Imdur.  She did not tolerate Ranexa 500 mg twice daily, she has been taking a half a tablet twice daily but has noticed less fatigue, dizziness.  Will discontinue Imdur to see if her headaches improve.  Will have her continue Ranexa 250 mg twice daily.  I do not think ischemic evaluation is indicates at this time.  Continue to monitor symptoms. Continue aspirin, metoprolol, and Crestor.  2. Chronic diastolic heart failure: Most recent echo in 2018 showed EF 55 to 60%, no RWMA, no significant valvular abnormalities. Euvolemic and well compensated on exam.  Continue current medications as above.   3. Chronic dizziness: Thought to be in the setting of dehydration, poor oral intake, worsened with Ranexa.  Will reduce Ranexa dosing as above.   Encouraged adequate hydration.   4. Hypertension: BP well controlled. Continue current antihypertensive regimen.    5. Hyperlipidemia: LDL was 59 in 03/2023. Continue Crestor.   6. Disposition:  Follow-up in 1 month.      Joylene Grapes, NP 08/23/2023, 3:19 PM

## 2023-08-23 NOTE — Patient Instructions (Addendum)
Medication Instructions:  Ranexa 500 mg-take 1/2 a tablet twice daily Stop Imdur as directed  *If you need a refill on your cardiac medications before your next appointment, please call your pharmacy*   Lab Work: NONE ordered at this time of appointment   Testing/Procedures: NONE ordered at this time of appointment    Follow-Up: At [facility], you and your health needs are our priority.  As part of our continuing mission to provide you with exceptional heart care, we have created designated Provider Care Teams.  These Care Teams include your primary Cardiologist (physician) and Advanced Practice Providers (APPs -  Physician Assistants and Nurse Practitioners) who all work together to provide you with the care you need, when you need it.  We recommend signing up for the patient portal called "MyChart".  Sign up information is provided on this After Visit Summary.  MyChart is used to connect with patients for Virtual Visits (Telemedicine).  Patients are able to view lab/test results, encounter notes, upcoming appointments, etc.  Non-urgent messages can be sent to your provider as well.   To learn more about what you can do with MyChart, go to [email or web address].    Your next appointment:   1 month(s)  Provider:   Bernadene Person, NP        Other Instructions

## 2023-09-27 ENCOUNTER — Encounter: Payer: Self-pay | Admitting: Nurse Practitioner

## 2023-09-27 ENCOUNTER — Ambulatory Visit: Payer: 59 | Attending: Nurse Practitioner | Admitting: Nurse Practitioner

## 2023-09-27 VITALS — BP 116/58 | HR 67 | Ht 60.0 in | Wt 130.0 lb

## 2023-09-27 DIAGNOSIS — R42 Dizziness and giddiness: Secondary | ICD-10-CM | POA: Diagnosis not present

## 2023-09-27 DIAGNOSIS — I10 Essential (primary) hypertension: Secondary | ICD-10-CM | POA: Diagnosis not present

## 2023-09-27 DIAGNOSIS — I2510 Atherosclerotic heart disease of native coronary artery without angina pectoris: Secondary | ICD-10-CM

## 2023-09-27 DIAGNOSIS — E785 Hyperlipidemia, unspecified: Secondary | ICD-10-CM

## 2023-09-27 DIAGNOSIS — I5032 Chronic diastolic (congestive) heart failure: Secondary | ICD-10-CM

## 2023-09-27 NOTE — Progress Notes (Signed)
Office Visit    Patient Name: Tammy Murillo Date of Encounter: 09/27/2023  Primary Care Provider:  Fleet Contras, MD Primary Cardiologist:  Bryan Lemma, MD  Chief Complaint    77 year old female with a history of CAD s/p DES-LAD in 2013, chronic diastolic heart failure, hypertension, hyperlipidemia, osteoarthritis, and anxiety who presents for follow-up related to CAD.    Past Medical History    Past Medical History:  Diagnosis Date   Anxiety    Borderline hypertension    CAD S/P percutaneous coronary angioplasty 12/31/2011   3 sequential LAD lesions treated with a single Promus Element DES; - Myoview 02/2016: No infarct or ischemia. EF 60-65%.   Costochondritis    Dyslipidemia, goal LDL below 70    H/O echocardiogram April 2014   Normal EF: 55-60%, no regional WMA, grade 1 diastolic dysfunction. Mild MR   History of colon cancer  2010   History of: Non-ST elevation MI (NSTEMI) 12/31/2011   Severe LAD lesions: Sequential 90%, 70% and 60%; no other significant stenoses. She had had a normal Myoview in 2012 AS WELL AS POST-PCI IN 2017   Osteoarthritis    Presence of drug coated stent in LAD coronary artery 01/01/2012   Promus Element DES 2.75 mm x38 mm postdilated to 3.0 mm   Past Surgical History:  Procedure Laterality Date   CHOLECYSTECTOMY  2004   LEFT HEART CATHETERIZATION WITH CORONARY ANGIOGRAM N/A 01/01/2012   Procedure: LEFT HEART CATHETERIZATION WITH CORONARY ANGIOGRAM;  Surgeon: Lennette Bihari, MD;  Location: [facility] CATH LAB;  Service: Cardiovascular;  Laterality: N/A;   NM MYOVIEW LTD  02/2016    LOW RISK study. No ischemia or infarction. EF 60-65%.   NM MYOVIEW LTD  03/06/2021   Lexiscan Myoview: EF 55 to 60%.  No ischemia or infarction.  Normal wall motion.  LOW RISK.   PERCUTANEOUS CORONARY STENT INTERVENTION (PCI-S)  01/01/2012   Procedure: PERCUTANEOUS CORONARY STENT INTERVENTION (PCI-S);  Surgeon: Lennette Bihari, MD;  Location: [facility] CATH LAB;  Service:  Cardiovascula: LAD PCI: Promus Element DES 2.75 MM by 30 MM, postdilated to 3.0 MM   TRANSTHORACIC ECHOCARDIOGRAM  06/2017   EF 55-60%. Grade 1 diastolic dysfunction. Mild MR. no RWMA    Allergies  No Known Allergies   Labs/Other Studies Reviewed    The following studies were reviewed today:  Cardiac Studies & Procedures     STRESS TESTS  MYOCARDIAL PERFUSION IMAGING 03/06/2021  Narrative  The left ventricular ejection fraction is normal (55-65%).  Nuclear stress EF: 59%.  There was no ST segment deviation noted during stress.  The study is normal.  This is a low risk study.  No ischemia or infarction on perfusion images. Normal wall motion.  ECHOCARDIOGRAM  ECHOCARDIOGRAM COMPLETE 07/15/2017  Narrative *[place]* *[facility]* [address] [place], [place] [address] [phone]  ------------------------------------------------------------------- Transthoracic Echocardiography  Patient:    Markiyah, Malter MR #:       [id number] Study Date: 07/15/2017 Gender:     F Age:        71 Height:     149.9 cm Weight:     61.8 kg BSA:        1.62 m^2 Pt. Status: Room:       1406  PERFORMING   W. Viann Fish, MD [facility]    [name] [address]    [name] [id number] SONOGRAPHER  [name], RDCS  ATTENDING    Elgergawy, Dawood S  cc:  ------------------------------------------------------------------- LV EF: 55% -   60%  ------------------------------------------------------------------- Indications:      429.3 Cardiomegaly.  ------------------------------------------------------------------- History:   PMH:   Coronary artery disease.  Risk factors: Dyslipidemia.  ------------------------------------------------------------------- Study Conclusions  - Left ventricle: The cavity size was normal. Systolic function was normal. The estimated ejection fraction was in  the range of 55% to 60%. Wall motion was normal; there were no regional wall motion abnormalities. - Left atrium: The atrium was mildly dilated.  ------------------------------------------------------------------- Study data:   Study status:  Routine.  Study completion:  There were no complications.          Transthoracic echocardiography. M-mode, complete 2D, spectral Doppler, and color Doppler. Birthdate:  Patient birthdate: Dec 12, 1945.  Age:  Patient is 77 yr old.  Sex:  Gender: female.    BMI: 27.5 kg/m^2.  Blood pressure: 114/81  Patient status:  Inpatient.  Study date:  Study date: 07/15/2017. Study time: 09:52 AM.  Location:  Bedside.  -------------------------------------------------------------------  ------------------------------------------------------------------- Left ventricle:  The cavity size was normal. Systolic function was normal. The estimated ejection fraction was in the range of 55% to 60%. Wall motion was normal; there were no regional wall motion abnormalities.  ------------------------------------------------------------------- Aortic valve:   Trileaflet; normal thickness leaflets. Mobility was not restricted.  Doppler:  Transvalvular velocity was within the normal range. There was no stenosis. There was no regurgitation.  ------------------------------------------------------------------- Aorta:  Aortic root: The aortic root was normal in size.  ------------------------------------------------------------------- Mitral valve:   Structurally normal valve.   Mobility was not restricted.  Doppler:  Transvalvular velocity was within the normal range. There was no evidence for stenosis. There was trivial regurgitation.    Peak gradient (D): 5 mm Hg.  ------------------------------------------------------------------- Left atrium:  The atrium was mildly dilated.  ------------------------------------------------------------------- Right ventricle:  The  cavity size was normal. Wall thickness was normal. Systolic function was normal.  ------------------------------------------------------------------- Pulmonic valve:   Not well visualized.  The valve appears to be grossly normal.    Doppler:  Transvalvular velocity was within the normal range. There was no evidence for stenosis.  ------------------------------------------------------------------- Tricuspid valve:   Structurally normal valve.    Doppler: Transvalvular velocity was within the normal range. There was mild regurgitation.  ------------------------------------------------------------------- Pulmonary artery:   The main pulmonary artery was normal-sized. Systolic pressure was within the normal range.  ------------------------------------------------------------------- Right atrium:  The atrium was normal in size.  ------------------------------------------------------------------- Pericardium:  There was no pericardial effusion.  ------------------------------------------------------------------- Systemic veins: Inferior vena cava: The vessel was normal in size.  ------------------------------------------------------------------- Measurements  Left ventricle                           Value        Reference LV ID, ED, PLAX chordal                  47.9  mm     43 - 52 LV ID, ES, PLAX chordal                  33.1  mm     23 - 38 LV fx shortening, PLAX chordal           31    %      >=29 LV PW thickness, ED  10.6  mm     --------- IVS/LV PW ratio, ED                      0.97         <=1.3 Stroke volume, 2D                        51    ml     --------- Stroke volume/bsa, 2D                    31    ml/m^2 --------- LV e&', lateral                           6.31  cm/s   --------- LV E/e&', lateral                         16.96        --------- LV e&', medial                            6.53  cm/s   --------- LV E/e&', medial                           16.39        --------- LV e&', average                           6.42  cm/s   --------- LV E/e&', average                         16.67        ---------  Ventricular septum                       Value        Reference IVS thickness, ED                        10.3  mm     ---------  LVOT                                     Value        Reference LVOT ID, S                               17    mm     --------- LVOT area                                2.27  cm^2   --------- LVOT peak velocity, S                    85    cm/s   --------- LVOT mean velocity, S                    61.2  cm/s   --------- LVOT VTI, S  22.4  cm     ---------  Aorta                                    Value        Reference Aortic root ID, ED                       33    mm     ---------  Left atrium                              Value        Reference LA ID, A-P, ES                           32    mm     --------- LA ID/bsa, A-P                           1.97  cm/m^2 <=2.2 LA volume, S                             44.3  ml     --------- LA volume/bsa, S                         27.3  ml/m^2 --------- LA volume, ES, 1-p A4C                   25.5  ml     --------- LA volume/bsa, ES, 1-p A4C               15.7  ml/m^2 --------- LA volume, ES, 1-p A2C                   75    ml     --------- LA volume/bsa, ES, 1-p A2C               46.2  ml/m^2 ---------  Mitral valve                             Value        Reference Mitral E-wave peak velocity              107   cm/s   --------- Mitral A-wave peak velocity              128   cm/s   --------- Mitral deceleration time                 211   ms     150 - 230 Mitral peak gradient, D                  5     mm Hg  --------- Mitral E/A ratio, peak                   0.8          ---------  Right ventricle                          Value  Reference TAPSE                                    25.6  mm     --------- RV s&', lateral, S                         12.2  cm/s   ---------  Legend: (L)  and  (H)  mark values outside specified reference range.  ------------------------------------------------------------------- Prepared and Electronically Authenticated by  Georga Hacking, MD [facility] 2018-10-18T13:31:42            Recent Labs: 04/22/2023: ALT 45; BUN 11; Creatinine, Ser 0.56; Potassium 4.6; Sodium 143  Recent Lipid Panel    Component Value Date/Time   CHOL 121 04/22/2023 1306   TRIG 131 04/22/2023 1306   HDL 39 (L) 04/22/2023 1306   CHOLHDL 3.1 04/22/2023 1306   CHOLHDL 3.2 01/24/2015 0920   VLDL 26 01/24/2015 0920   LDLCALC 59 04/22/2023 1306    History of Present Illness    77 year old female with the above past medical history including CAD s/p DES-LAD in 2013, chronic diastolic heart failure, hypertension, hyperlipidemia, osteoarthritis, and anxiety.   She was hospitalized in April 2013 in the setting of NSTEMI.  She had 3 lesions in the LAD treated with 1 stent, no other significant disease.  She has had intermittent atypical chest pain since this time.  She has had multiple stress tests, all of which have been nonischemic, most recently in 2022. Most recent echo in 2018 showed EF 55 to 60%, no RWMA, no significant valvular abnormalities. Additionally, she has a history of chronic intermittent dizziness in the setting of dehydration, poor oral intake.  She was last seen in the office on 08/23/2023 and was stable from a cardiac standpoint.  She noted intermittent fleeting chest discomfort, fleeting shortness of breath in the setting of emotional distress, mild dizziness, frequent headaches.  Imdur was discontinued.  She was started on Ranexa.  She declined stress test.   She presents today for follow-up accompanied by her daughter who provides interpretation per patient request. Since her last visit she has been stable from a cardiac standpoint.  She continues to note mild intermittent chest discomfort that occurs only  with emotional stress, somewhat improved with Ranexa.  She denies any exertional symptoms.  She has not had any further headaches since discontinuing Imdur.  Additionally, her dizziness has improved.  She hopes to travel to [place] in February 2025. Overall, she reports feeling well.    Home Medications    Current Outpatient Medications  Medication Sig Dispense Refill   acetaminophen (TYLENOL) 325 MG tablet Take 2 tablets (650 mg total) by mouth every 6 (six) hours as needed for mild pain or headache (or Fever >/= 101). 45 tablet 0   aspirin EC 81 MG tablet Take 1 tablet (81 mg total) by mouth daily. Swallow whole. 90 tablet 3   atorvastatin (LIPITOR) 40 MG tablet Take 40 mg by mouth daily.     diclofenac Sodium (VOLTAREN) 1 % GEL Apply 1 application topically 4 (four) times daily.     esomeprazole (NEXIUM) 40 MG capsule TAKE 1 CAPSULE (40 MG TOTAL) BY MOUTH DAILY. 90 capsule 3   methylPREDNISolone (MEDROL DOSEPAK) 4 MG TBPK tablet      metoprolol succinate (TOPROL-XL) 25 MG 24 hr tablet Take 0.5 tablets (12.5 mg total) by mouth  daily. 45 tablet 3   nitroGLYCERIN (NITROSTAT) 0.4 MG SL tablet PLACE 1 TABLET UNDER THE TONGUE, EVERY 5 MINUTES FOR 3 DOSES, AS NEEDED FOR CHEST PAIN. 25 tablet 3   ondansetron (ZOFRAN ODT) 8 MG disintegrating tablet Take 1 tablet (8 mg total) by mouth every 8 (eight) hours as needed for nausea or vomiting. 30 tablet 0   ranolazine (RANEXA) 500 MG 12 hr tablet Take 1/2 tablet twice daily as directed 45 tablet 3   rosuvastatin (CRESTOR) 40 MG tablet Take 1 tablet (40 mg total) by mouth daily. 90 tablet 3   VOLTAREN 1 % GEL Apply 1 application topically 4 (four) times daily as needed (for pain).   5   No current facility-administered medications for this visit.     Review of Systems    She denies palpitations, dyspnea, pnd, orthopnea, n, v, dizziness, syncope, edema, weight gain, or early satiety. All other systems reviewed and are otherwise negative except as noted  above.   Physical Exam    VS:  BP (!) 116/58   Pulse 67   Ht 5' (1.524 m)   Wt 130 lb (59 kg)   SpO2 96%   BMI 25.39 kg/m   GEN: Well nourished, well developed, in no acute distress. HEENT: normal. Neck: Supple, no JVD, carotid bruits, or masses. Cardiac: RRR, no murmurs, rubs, or gallops. No clubbing, cyanosis, edema.  Radials/DP/PT 2+ and equal bilaterally.  Respiratory:  Respirations regular and unlabored, clear to auscultation bilaterally. GI: Soft, nontender, nondistended, BS + x 4. MS: no deformity or atrophy. Skin: warm and dry, no rash. Neuro:  Strength and sensation are intact. Psych: Normal affect.  Accessory Clinical Findings    ECG personally reviewed by me today -    - no EKG in office today.    Lab Results  Component Value Date   WBC 8.5 04/13/2022   HGB 10.6 (L) 04/13/2022   HCT 35.6 04/13/2022   MCV 70 (L) 04/13/2022   PLT 255 04/13/2022   Lab Results  Component Value Date   CREATININE 0.56 (L) 04/22/2023   BUN 11 04/22/2023   NA 143 04/22/2023   K 4.6 04/22/2023   CL 104 04/22/2023   CO2 24 04/22/2023   Lab Results  Component Value Date   ALT 45 (H) 04/22/2023   AST 53 (H) 04/22/2023   ALKPHOS 83 04/22/2023   BILITOT 0.3 04/22/2023   Lab Results  Component Value Date   CHOL 121 04/22/2023   HDL 39 (L) 04/22/2023   LDLCALC 59 04/22/2023   TRIG 131 04/22/2023   CHOLHDL 3.1 04/22/2023    No results found for: "HGBA1C"  Assessment & Plan   1. CAD: S/p DES-LAD in 2013. She has had multiple stress tests, all of which have been nonischemic, most recently in 2022.  Historically, her chest discomfort has been associated with emotional stress. Her symptoms have improved with Ranexa.  She did not tolerate Imdur due to headaches. Will have her continue Ranexa 250 mg twice daily.  I do not think ischemic evaluation is indicated at this time.  Continue to monitor symptoms. Continue aspirin, metoprolol, Ranexa and Crestor.    2. Chronic diastolic  heart failure: Most recent echo in 2018 showed EF 55 to 60%, no RWMA, no significant valvular abnormalities. Euvolemic and well compensated on exam.  Continue current medications as above.   3. Chronic dizziness: Thought to be in the setting of dehydration, poor oral intake, improved with decreased Ranexa dosing.  Encouraged adequate hydration.   4. Hypertension: BP well controlled. Continue current antihypertensive regimen.    5. Hyperlipidemia: LDL was 59 in 03/2023. Continue Crestor.   6. Disposition:  Follow-up in 6 months.       Joylene Grapes, NP 09/27/2023, 9:14 AM

## 2023-09-27 NOTE — Patient Instructions (Signed)
Medication Instructions:  Your physician recommends that you continue on your current medications as directed. Please refer to the Current Medication list given to you today.  *If you need a refill on your cardiac medications before your next appointment, please call your pharmacy*   Lab Work: NONE ordered at this time of appointment    Testing/Procedures: NONE ordered at this time of appointment     Follow-Up: At [facility], you and your health needs are our priority.  As part of our continuing mission to provide you with exceptional heart care, we have created designated Provider Care Teams.  These Care Teams include your primary Cardiologist (physician) and Advanced Practice Providers (APPs -  Physician Assistants and Nurse Practitioners) who all work together to provide you with the care you need, when you need it.  We recommend signing up for the patient portal called "MyChart".  Sign up information is provided on this After Visit Summary.  MyChart is used to connect with patients for Virtual Visits (Telemedicine).  Patients are able to view lab/test results, encounter notes, upcoming appointments, etc.  Non-urgent messages can be sent to your provider as well.   To learn more about what you can do with MyChart, go to [email or web address].    Your next appointment:   6 month(s)  Provider:   David Harding, MD     Other Instructions   

## 2023-10-10 ENCOUNTER — Emergency Department (HOSPITAL_COMMUNITY): Payer: 59

## 2023-10-10 ENCOUNTER — Encounter (HOSPITAL_COMMUNITY): Payer: Self-pay | Admitting: Emergency Medicine

## 2023-10-10 ENCOUNTER — Emergency Department (HOSPITAL_COMMUNITY)
Admission: EM | Admit: 2023-10-10 | Discharge: 2023-10-10 | Disposition: A | Discharge status: Home / Self Care | Payer: 59

## 2023-10-10 DIAGNOSIS — S0003XA Contusion of scalp, initial encounter: Secondary | ICD-10-CM | POA: Diagnosis not present

## 2023-10-10 DIAGNOSIS — Z7982 Long term (current) use of aspirin: Secondary | ICD-10-CM | POA: Diagnosis not present

## 2023-10-10 DIAGNOSIS — W01198A Fall on same level from slipping, tripping and stumbling with subsequent striking against other object, initial encounter: Secondary | ICD-10-CM | POA: Insufficient documentation

## 2023-10-10 DIAGNOSIS — S0990XA Unspecified injury of head, initial encounter: Secondary | ICD-10-CM | POA: Diagnosis present

## 2023-10-10 DIAGNOSIS — W19XXXA Unspecified fall, initial encounter: Secondary | ICD-10-CM

## 2023-10-10 NOTE — Discharge Instructions (Addendum)
 You were seen in the ER today for concerns of a fall. Your CT scan was negative for any head bleed or skull fracture. There is a small area of calcification in the head that appears to be stable compared to prior imaging. Your scan on your neck was also reassuring with no acute injury seen. Please take Tylenol  or ibuprofen as needed for pain for the headaches and hematoma. Follow up with your primary care provider.

## 2023-10-10 NOTE — ED Triage Notes (Signed)
 Pt here from home with c/o slip and fall on the ice this morning around 11 am , pt has a hematoma to the back of her head , no loc or thinners , family just wants pt checked out

## 2023-10-10 NOTE — ED Provider Notes (Addendum)
 [place] EMERGENCY DEPARTMENT AT [facility] Provider Note   CSN: [id number] Arrival date & time: 10/10/23  1525     History Chief Complaint  Patient presents with   Fall    Tammy Murillo is a 78 y.o. female.  Patient with past history significant for borderline hypertension, pancytopenia, anemia who presents to the emergency department with concerns of a fall.  Patient's daughters at bedside reports that she had a mechanical fall when she slipped on some ice this morning around 11 AM.  She struck the back of her head as well as a hematoma in this area.  Daughter denies that patient lost consciousness.  She is not on blood thinners.  Will endorsing a mild headache but denies any nausea or vomiting since the fall.   Fall       Home Medications Prior to Admission medications   Medication Sig Start Date End Date Taking? Authorizing Provider  acetaminophen  (TYLENOL ) 325 MG tablet Take 2 tablets (650 mg total) by mouth every 6 (six) hours as needed for mild pain or headache (or Fever >/= 101). 07/17/17   Ricky Fines, MD  aspirin  EC 81 MG tablet Take 1 tablet (81 mg total) by mouth daily. Swallow whole. 04/22/23   Daneen Damien BROCKS, NP  atorvastatin  (LIPITOR) 40 MG tablet Take 40 mg by mouth daily. 02/26/23   [provider]  diclofenac Sodium (VOLTAREN) 1 % GEL Apply 1 application topically 4 (four) times daily. 02/28/20   [provider]  esomeprazole  (NEXIUM ) 40 MG capsule TAKE 1 CAPSULE (40 MG TOTAL) BY MOUTH DAILY. 02/08/23   Anner Alm ORN, MD  methylPREDNISolone (MEDROL DOSEPAK) 4 MG TBPK tablet  02/01/20   [provider]  metoprolol  succinate (TOPROL -XL) 25 MG 24 hr tablet Take 0.5 tablets (12.5 mg total) by mouth daily. 04/22/23   Daneen Damien BROCKS, NP  nitroGLYCERIN  (NITROSTAT ) 0.4 MG SL tablet PLACE 1 TABLET UNDER THE TONGUE, EVERY 5 MINUTES FOR 3 DOSES, AS NEEDED FOR CHEST PAIN. 04/22/23   Daneen Damien BROCKS, NP  ondansetron  (ZOFRAN  ODT) 8 MG  disintegrating tablet Take 1 tablet (8 mg total) by mouth every 8 (eight) hours as needed for nausea or vomiting. 07/17/17   Ricky Fines, MD  ranolazine  (RANEXA ) 500 MG 12 hr tablet Take 1/2 tablet twice daily as directed 08/23/23   Daneen Damien BROCKS, NP  rosuvastatin  (CRESTOR ) 40 MG tablet Take 1 tablet (40 mg total) by mouth daily. 04/22/23   Daneen Damien BROCKS, NP  VOLTAREN 1 % GEL Apply 1 application topically 4 (four) times daily as needed (for pain).  12/26/14   [provider]      Allergies    Patient has no known allergies.    Review of Systems   Review of Systems  All other systems reviewed and are negative.   Physical Exam Updated Vital Signs BP 114/65   Pulse 65   Temp 98.5 F (36.9 C) (Oral)   Resp 18   SpO2 98%  Physical Exam Vitals and nursing note reviewed.  Constitutional:      General: She is not in acute distress.    Appearance: She is well-developed.  HENT:     Head: Normocephalic and atraumatic.  Eyes:     Extraocular Movements: Extraocular movements intact.     Conjunctiva/sclera: Conjunctivae normal.     Pupils: Pupils are equal, round, and reactive to light.  Cardiovascular:     Rate and Rhythm: Normal rate and regular rhythm.  Heart sounds: No murmur heard. Pulmonary:     Effort: Pulmonary effort is normal. No respiratory distress.     Breath sounds: Normal breath sounds.  Abdominal:     Palpations: Abdomen is soft.     Tenderness: There is no abdominal tenderness.  Musculoskeletal:        General: Tenderness present. No swelling, deformity or signs of injury. Normal range of motion.       Arms:     Cervical back: Neck supple.     Comments: Midline cervical spine tenderness with no appreciable bony deformity.  Skin:    General: Skin is warm and dry.     Capillary Refill: Capillary refill takes less than 2 seconds.          Comments: Scalp hematoma in the right occipital region, no laceration or open wound.  Neurological:     Mental  Status: She is alert.  Psychiatric:        Mood and Affect: Mood normal.     ED Results / Procedures / Treatments   Labs (all labs ordered are listed, but only abnormal results are displayed) Labs Reviewed - No data to display  EKG None  Radiology CT Cervical Spine Wo Contrast Result Date: 10/10/2023 CLINICAL DATA:  Neck trauma (Age >= 65y) Slip and fall on ice.  Hematoma to back of head. EXAM: CT CERVICAL SPINE WITHOUT CONTRAST TECHNIQUE: Multidetector CT imaging of the cervical spine was performed without intravenous contrast. Multiplanar CT image reconstructions were also generated. RADIATION DOSE REDUCTION: This exam was performed according to the departmental dose-optimization program which includes automated exposure control, adjustment of the mA and/or kV according to patient size and/or use of iterative reconstruction technique. COMPARISON:  None Available. FINDINGS: Alignment: Straightening of normal lordosis. No traumatic subluxation. Skull base and vertebrae: No acute fracture. Vertebral body heights are maintained. The dens and skull base are intact. Soft tissues and spinal canal: No prevertebral fluid or swelling. No visible canal hematoma. Disc levels: C5-C6 and C6-C7 degenerative disc disease. There is mild multilevel facet hypertrophy. Upper chest: No acute findings. Other: Carotid calcifications. IMPRESSION: Degenerative change in the cervical spine without acute fracture or subluxation. Electronically Signed   By: Andrea Gasman M.D.   On: 10/10/2023 17:30   CT HEAD WO CONTRAST ( ) Result Date: 10/10/2023 CLINICAL DATA:  Slip and fall on ice this morning. Hematoma to back of head. EXAM: CT HEAD WITHOUT CONTRAST TECHNIQUE: Contiguous axial images were obtained from the base of the skull through the vertex without intravenous contrast. RADIATION DOSE REDUCTION: This exam was performed according to the departmental dose-optimization program which includes automated exposure  control, adjustment of the mA and/or kV according to patient size and/or use of iterative reconstruction technique. COMPARISON:  Head CT 07/20/2022 FINDINGS: Brain: No intracranial hemorrhage, mass effect, or midline shift. Brain volume is normal for age. No hydrocephalus. The basilar cisterns are patent. No evidence of territorial infarct or acute ischemia. No extra-axial or intracranial fluid collection. Extra-axial calcification in the left frontal convexity measures approximately 10 x 8 mm and may represent a meningioma, this is stable from prior exams. No associated mass effect. Vascular: Atherosclerosis of skullbase vasculature without hyperdense vessel or abnormal calcification. Skull: Atherosclerosis of skullbase vasculature without hyperdense vessel or abnormal calcification. Sinuses/Orbits: Paranasal sinuses and mastoid air cells are clear. The visualized orbits are unremarkable. Right cataract resection. Other: Right posterior parietooccipital scalp hematoma. IMPRESSION: 1. Right posterior parietooccipital scalp hematoma. No acute intracranial abnormality. No skull fracture.  2. Stable small extra-axial calcification in the left frontal convexity, may represent a meningioma. No associated mass effect. This needs no specific imaging follow-up. Electronically Signed   By: Andrea Gasman M.D.   On: 10/10/2023 17:26    Procedures Procedures    Medications Ordered in ED Medications - No data to display  ED Course/ Medical Decision Making/ A&P                                 Medical Decision Making Amount and/or Complexity of Data Reviewed Radiology: ordered.   This patient presents to the ED for concern of fall.  Differential diagnosis includes contusion, SAH, skull fracture, concussion   Imaging Studies ordered:  I ordered imaging studies including CT head, CT cervical spine I independently visualized and interpreted imaging which showed . Right posterior parietooccipital scalp  hematoma. No acute intracranial abnormality. No skull fracture. 2. Stable small extra-axial calcification in the left frontal convexity, may represent a meningioma. No associated mass effect.Degenerative change in the cervical spine without acute fracture or subluxation. I agree with the radiologist interpretation   Problem List / ED Course:  Patient with history significant for hypertension, pancytopenia, anemia presents emergency department following a mechanical fall.  Patient with daughter at bedside reports that she had a fall after slipping on ice this morning around 11 AM.  There is a hematoma to the right occiput.  No laceration.  Daughter denies any loss of consciousness or transient alteration of awareness.  No nausea, vomiting,.  Does endorse a mild headache to the right side of her head.  Not on blood thinners.  She is also endorsing pain to the midline cervical spine towards the base of her neck.  Range of motion slightly limited due to pain. Given mechanism and patient's age, will obtain CT imaging of the head and cervical spine for evaluation for possible intracranial injury as well as skeletal injury to the cervical spine. CT imaging is reassuring without any acute findings.  Clear patient incidental finding of a small extra-axial calcification in the left frontal aspect of patient's brain that may represent a meningioma.  This appears to be stable without any significant changes since last imaged about 14 months ago.  No recommended imaging follow-up.  CT cervical spine unremarkable for any acute injuries. Informed patient and family regarding reassuring findings as well as incidental finding on CT head.  Discussed management of symptoms with over-the-counter medications for pain as needed for headaches such as Tylenol  ibuprofen.  Discussed return precautions.  Encourage close PCP follow-up.  Patient discharged home in stable condition.  Final Clinical Impression(s) / ED Diagnoses Final  diagnoses:  Fall, initial encounter  Hematoma of scalp, initial encounter    Rx / DC Orders ED Discharge Orders     None         Tammy Legrand LABOR, PA-C 10/10/23 1756    Tammy Legrand LABOR, PA-C 10/10/23 1757    Ula Prentice SAUNDERS, MD 10/10/23 2202

## 2023-10-27 ENCOUNTER — Ambulatory Visit: Payer: 59 | Admitting: Nurse Practitioner

## 2023-12-30 DIAGNOSIS — Z1239 Encounter for other screening for malignant neoplasm of breast: Secondary | ICD-10-CM | POA: Diagnosis not present

## 2023-12-30 DIAGNOSIS — Z0000 Encounter for general adult medical examination without abnormal findings: Secondary | ICD-10-CM | POA: Diagnosis not present

## 2023-12-30 DIAGNOSIS — E0842 Diabetes mellitus due to underlying condition with diabetic polyneuropathy: Secondary | ICD-10-CM | POA: Diagnosis not present

## 2023-12-30 DIAGNOSIS — E7849 Other hyperlipidemia: Secondary | ICD-10-CM | POA: Diagnosis not present

## 2023-12-30 DIAGNOSIS — E1142 Type 2 diabetes mellitus with diabetic polyneuropathy: Secondary | ICD-10-CM | POA: Diagnosis not present

## 2023-12-30 DIAGNOSIS — J019 Acute sinusitis, unspecified: Secondary | ICD-10-CM | POA: Diagnosis not present

## 2024-01-03 ENCOUNTER — Other Ambulatory Visit: Payer: Self-pay | Admitting: Internal Medicine

## 2024-01-03 DIAGNOSIS — Z1231 Encounter for screening mammogram for malignant neoplasm of breast: Secondary | ICD-10-CM

## 2024-01-11 ENCOUNTER — Ambulatory Visit
Admission: RE | Admit: 2024-01-11 | Discharge: 2024-01-11 | Disposition: A | Discharge status: Home / Self Care | Source: Ambulatory Visit | Attending: Internal Medicine | Admitting: Internal Medicine

## 2024-01-11 DIAGNOSIS — Z1231 Encounter for screening mammogram for malignant neoplasm of breast: Secondary | ICD-10-CM | POA: Diagnosis not present

## 2024-01-25 DIAGNOSIS — I2510 Atherosclerotic heart disease of native coronary artery without angina pectoris: Secondary | ICD-10-CM | POA: Diagnosis not present

## 2024-01-25 DIAGNOSIS — E7849 Other hyperlipidemia: Secondary | ICD-10-CM | POA: Diagnosis not present

## 2024-01-25 DIAGNOSIS — E1142 Type 2 diabetes mellitus with diabetic polyneuropathy: Secondary | ICD-10-CM | POA: Diagnosis not present

## 2024-02-01 ENCOUNTER — Other Ambulatory Visit: Payer: Self-pay | Admitting: Cardiology

## 2024-02-28 ENCOUNTER — Telehealth: Payer: Self-pay | Admitting: Cardiology

## 2024-02-28 MED ORDER — ESOMEPRAZOLE MAGNESIUM 40 MG PO CPDR: 40.0000 mg | DELAYED_RELEASE_CAPSULE | Freq: Every day | ORAL | 1 refills | Status: DC

## 2024-02-28 NOTE — Telephone Encounter (Signed)
 Pt's medication was sent to pt's pharmacy as requested. Confirmation received.

## 2024-02-28 NOTE — Telephone Encounter (Signed)
*  STAT* If patient is at the pharmacy, call can be transferred to refill team.   1. Which medications need to be refilled? (please list name of each medication and dose if known) Esomeprazole    2. Would you like to learn more about the convenience, safety, & potential cost savings by using the [facility]?     3. Are you open to using the [facility] (Type [facility].  4. Which pharmacy/location (including street and city if local pharmacy) is medication to be sent to?[facility] [address], [place]   5. Do they need a 30 day or 90 day supply? 90 days and refills- please call today- out of medicine

## 2024-03-08 ENCOUNTER — Ambulatory Visit: Payer: 59 | Attending: Cardiovascular Disease | Admitting: Cardiology

## 2024-03-08 ENCOUNTER — Other Ambulatory Visit: Payer: Self-pay | Admitting: Nurse Practitioner

## 2024-04-06 ENCOUNTER — Other Ambulatory Visit: Payer: Self-pay

## 2024-04-06 MED ORDER — RANOLAZINE ER 500 MG PO TB12: ORAL_TABLET | ORAL | 1 refills | Status: DC

## 2024-05-01 ENCOUNTER — Encounter: Payer: Self-pay | Admitting: Cardiology

## 2024-05-01 ENCOUNTER — Ambulatory Visit: Attending: Cardiology | Admitting: Cardiology

## 2024-05-01 VITALS — BP 110/62 | HR 64 | Ht 60.0 in | Wt 125.0 lb

## 2024-05-01 DIAGNOSIS — I214 Non-ST elevation (NSTEMI) myocardial infarction: Secondary | ICD-10-CM

## 2024-05-01 DIAGNOSIS — R030 Elevated blood-pressure reading, without diagnosis of hypertension: Secondary | ICD-10-CM

## 2024-05-01 DIAGNOSIS — E785 Hyperlipidemia, unspecified: Secondary | ICD-10-CM

## 2024-05-01 DIAGNOSIS — I2510 Atherosclerotic heart disease of native coronary artery without angina pectoris: Secondary | ICD-10-CM

## 2024-05-01 DIAGNOSIS — Z9861 Coronary angioplasty status: Secondary | ICD-10-CM | POA: Diagnosis not present

## 2024-05-01 DIAGNOSIS — I5032 Chronic diastolic (congestive) heart failure: Secondary | ICD-10-CM

## 2024-05-01 DIAGNOSIS — R42 Dizziness and giddiness: Secondary | ICD-10-CM

## 2024-05-01 NOTE — Patient Instructions (Signed)
 Medication Instructions:   No changes  *If you need a refill on your cardiac medications before your next appointment, please call your pharmacy*   Lab Work:fasting CMP LIPID  If you have labs (blood work) drawn today and your tests are completely normal, you will receive your results only by: MyChart Message (if you have MyChart) OR A paper copy in the mail If you have any lab test that is abnormal or we need to change your treatment, we will call you to review the results.   Testing/Procedures: Not needed   Follow-Up: At [facility], you and your health needs are our priority.  As part of our continuing mission to provide you with exceptional heart care, we have created designated Provider Care Teams.  These Care Teams include your primary Cardiologist (physician) and Advanced Practice Providers (APPs -  Physician Assistants and Nurse Practitioners) who all work together to provide you with the care you need, when you need it.     Your next appointment:   12 month(s)  The format for your next appointment:   In Person  Provider:   Damien Braver, NP      Then, Alm Clay, MD will plan to see you again in 24 month(s).

## 2024-05-01 NOTE — Progress Notes (Signed)
 Cardiology Office Note:  .   Date:  05/08/2024  ID:  Tammy Murillo, DOB 19-Sep-1946, MRN [id number] PCP: Tammy Atlas, MD  [facility] Providers Cardiologist:  Tammy Clay, MD     Chief Complaint  Patient presents with   Follow-up    Delayed 72-month follow-up   Coronary Artery Disease    Still has mild intermittent chest pain is much improved on Ranexa .    Patient Profile: .     Tammy Murillo is a very pleasant 78 y.o.Tammy Murillo  [place] female with a PMH noted below who presents here for delayed 21-month at the request of Tammy Atlas, MD.  She is accompanied by her daughter or granddaughter who serves as an Equities trader.  She does not like to use the contracted interpreters.  PMH:  NSTEMI / CAD- LAD PCI (April 2013) Multiple nonischemic stress test both before and after her non-STEMI. HLD with statin myalgias Borderline HTN with chronic HFpEF- Chronic MSK PrecordialPain Significant anxiety  I last saw her in July 2023:  Although the licensed interpreter was there, her friend actually was more of an interpreter.  She was no longer having any dizziness or lightheadedness at that time.  She continues to have her musculoskeletal type chest pain.  When she started feeling dizzy or lightheaded she would go get some to drink and eat and would feel better.  No issues with yard work.  Does better in heat and cold.  Only other chest comfort when upset or stressed which has been chronic.  Not eating as much, therefore lost weight.  Somewhat easily fatigued.  Mild musculoskeletal pains.  Nervous. => No changes were made.  I see we ordered liver send she had an echo July 2 Changes to use Johnson, general TIG lipids chemistries A1c and CBC.    Tammy Murillo (several times by Tammy Braver, NP since her last visit with me-initially in July 2024, October, November and December 2024 with her last visit being December 2024.  She had noted intermittent fleeting chest discomfort when seen in  November 2020 for but was also having frequent headaches and therefore Imdur  was discontinued.  She was started on Ranexa .  She declined a stress test at that time. => In follow-up she was companied by her daughter.  She was stable from our standpoint.  Still intermittent chest discomfort with emotional stress but did improve with Ranexa .  No exertional symptoms.  Headaches improved since stopping Imdur .  She had planned to travel in February back to [place]  Subjective  Discussed the use of AI scribe software for clinical note transcription with the patient, who gave verbal consent to proceed.  History of Present Illness  Tammy Murillo is a 78 year old female with coronary artery disease who presents for follow-up of chest discomfort.  Chest discomfort is stable as long as she avoids stress or anger. She takes Ranexa  at a low dose, half a tablet twice a day, which helps manage her symptoms. She has not needed nitroglycerin  in the past two months. Her last stress test in June 2022 showed no evidence of myocardial infarction or significant blockages, and her echocardiogram in October 2018 was normal.  She experiences dizziness when bending over or standing up, but no chest discomfort or shortness of breath during routine activities like walking or grocery shopping. Symptoms of shortness of breath or heart palpitations occur primarily when she is upset or anxious.  She has a history of a myocardial infarction and  has been stable since then. Blood sugar levels were noted to be slightly elevated, but creatinine was normal at 0.56. Liver function tests showed an AST of 53, and she is due for rechecking of her labs this year.  She has been dealing with stress related to the loss of a grandchild, impacting her emotional well-being.     Objective   Medications: Aspirin  81 mg daily; rosuvastatin  40 mg daily; Toprol  25 mg 1/2 tab daily Ranexa  500 mg-1/2 tab twice daily Nexium  40 mg daily; Zofran  8 mg  every 8 hours as needed  Studies Reviewed: .        03/2023    Previously Reviewed Studies: Lexiscan  Myoview  June 2022: LOW RISK.  EF 55 to 65%.  No Ischemia or Infarction. Echocardiogram October 2018: Normal LV size and function with EF 55 to 60%.  No RWMA.  Normal valves.  Mild LA dilation.  Risk Assessment/Calculations:     Physical Exam:   VS:  BP 110/62   Pulse 64   Ht 5' (1.524 m)   Wt 125 lb (56.7 kg)   SpO2 97%   BMI 24.41 kg/m    Wt Readings from Last 3 Encounters:  05/01/24 125 lb (56.7 kg)  09/27/23 130 lb (59 kg)  08/23/23 132 lb (59.9 kg)    GEN: Well nourished, well well-groomed in no acute distress; healthy-appearing NECK: No JVD; No carotid bruits CARDIAC: Normal S1, S2; RRR, soft 1/6 SEM at RUSB.  Otherwise no murmurs, rubs, gallops RESPIRATORY:  Clear to auscultation without rales, wheezing or rhonchi ; nonlabored, good air movement. ABDOMEN: Soft, non-tender, non-distended EXTREMITIES:  No edema; No deformity      ASSESSMENT AND PLAN: .    Problem List Items Addressed This Visit       Cardiology Problems   CAD S/P  - DES to LAD April 2013 - Primary (Chronic)   Single-vessel CAD with LAD PCI in setting of non-STEMI. Last stress test in June 2022 showed no myocardial infarction or significant coronary artery blockages. For the most part her chest discomfort seems to be pretty well-controlled with Ranexa .. Symptoms are often triggered by triggered by stress or anger. No recent nitroglycerin  use.  - Continue Ranexa  as prescribed.  (500 mg- 1/2 tab twice daily, and Toprol -XL 25 mg 1/2 tab daily - Continue 81 mg aspirin  along with 40 mg rosuvastatin .-  - Encourage stress management. - Order lab tests for cholesterol and liver function.      Relevant Orders   Lipid panel (Completed)   Comprehensive metabolic panel with GFR (Completed)   Chronic diastolic CHF (congestive heart failure) (HCC) (Chronic)   No real activity CHF symptoms.   BP  well-controlled only on low-dose Toprol  12.5 mg daily.      Hyperlipidemia with target LDL less than 70 (Chronic)   Due for labs to be checked soon.  Excellent control as of last year. Will switch from atorvastatin  to rosuvastatin  at last visit.  -Continue statin with PCP monitoring for lipids.       Relevant Orders   Lipid panel (Completed)   Comprehensive metabolic panel with GFR (Completed)   NSTEMI, April 2013 (Chronic)   Over 12 years out from MI.  Has had several negative stress test since then.  She intermittently has chest discomfort which is usually associated with stress and anxiety and being upset but not exertional.  Preserved EF with no heart failure symptoms.        Other   Borderline hypertension (  Chronic)   BP well-controlled on low-dose Toprol  12.5 mg daily      Relevant Orders   Lipid panel (Completed)   Comprehensive metabolic panel with GFR (Completed)   Episode of dizziness (Chronic)   Dizziness with positional changes likely due to orthostatic hypotension and low blood pressure, possibly exacerbated by inadequate hydration. - Advise increasing fluid intake to 1.5 to 2 liters per day.              Follow-Up: Return in about 1 year (around 05/01/2025) for 1 Yr Follow-up, Alternating annual follow-ups APP and MD.     Signed, Tammy MICAEL Clay, MD, MS Tammy Murillo, M.D., M.S. Interventional Chartered certified accountant  Pager # [phone]

## 2024-05-04 ENCOUNTER — Ambulatory Visit: Payer: Self-pay | Admitting: Cardiology

## 2024-05-04 LAB — LIPID PANEL
Chol/HDL Ratio: 3.4 ratio (ref 0.0–4.4)
Cholesterol, Total: 120 mg/dL (ref 100–199)
HDL: 35 mg/dL — ABNORMAL LOW (ref >39)
LDL Chol Calc (NIH): 60 mg/dL (ref 0–99)
Triglycerides: 146 mg/dL (ref 0–149)
VLDL Cholesterol Cal: 25 mg/dL (ref 5–40)

## 2024-05-04 LAB — COMPREHENSIVE METABOLIC PANEL WITH GFR
ALT: 21 IU/L (ref 0–32)
AST: 33 IU/L (ref 0–40)
Albumin: 4.3 g/dL (ref 3.8–4.8)
Alkaline Phosphatase: 57 IU/L (ref 44–121)
BUN/Creatinine Ratio: 17 (ref 12–28)
BUN: 12 mg/dL (ref 8–27)
Bilirubin Total: 0.4 mg/dL (ref 0.0–1.2)
CO2: 18 mmol/L — ABNORMAL LOW (ref 20–29)
Calcium: 9.8 mg/dL (ref 8.7–10.3)
Chloride: 103 mmol/L (ref 96–106)
Creatinine, Ser: 0.7 mg/dL (ref 0.57–1.00)
Globulin, Total: 2.8 g/dL (ref 1.5–4.5)
Glucose: 121 mg/dL — ABNORMAL HIGH (ref 70–99)
Potassium: 4.1 mmol/L (ref 3.5–5.2)
Sodium: 141 mmol/L (ref 134–144)
Total Protein: 7.1 g/dL (ref 6.0–8.5)
eGFR: 88 mL/min/1.73 (ref >59)

## 2024-05-05 NOTE — Telephone Encounter (Signed)
-----   Message from Alm Clay sent at 05/04/2024 10:54 PM EDT ----- Overall labs look pretty good and stable.  Cholesterol panel is almost identical to last year.  Still well-controlled. Chemistry panel still shows slightly elevated blood sugar levels but normal kidney function.  Thankfully, the liver function is now normal.  Overall reassuring labs.  Continue current medicines.  Alm Clay, MD . ----- Message ----- From: Interface, [facility] Lab Results In Sent: 05/04/2024  12:35 AM EDT To: Alm LELON Clay, MD

## 2024-05-05 NOTE — Telephone Encounter (Signed)
 Left detail result message  on daughter Samule voicemail per DPR Any question may call back

## 2024-05-08 ENCOUNTER — Encounter: Payer: Self-pay | Admitting: Cardiology

## 2024-05-08 NOTE — Assessment & Plan Note (Signed)
 Dizziness with positional changes likely due to orthostatic hypotension and low blood pressure, possibly exacerbated by inadequate hydration. - Advise increasing fluid intake to 1.5 to 2 liters per day.

## 2024-05-08 NOTE — Assessment & Plan Note (Signed)
 BP well-controlled on low-dose Toprol  12.5 mg daily

## 2024-05-08 NOTE — Assessment & Plan Note (Signed)
 No real activity CHF symptoms.   BP well-controlled only on low-dose Toprol  12.5 mg daily.

## 2024-05-08 NOTE — Assessment & Plan Note (Signed)
 Over 12 years out from MI.  Has had several negative stress test since then.  She intermittently has chest discomfort which is usually associated with stress and anxiety and being upset but not exertional.  Preserved EF with no heart failure symptoms.

## 2024-05-08 NOTE — Assessment & Plan Note (Signed)
 Single-vessel CAD with LAD PCI in setting of non-STEMI. Last stress test in June 2022 showed no myocardial infarction or significant coronary artery blockages. For the most part her chest discomfort seems to be pretty well-controlled with Ranexa .. Symptoms are often triggered by triggered by stress or anger. No recent nitroglycerin  use.  - Continue Ranexa  as prescribed.  (500 mg- 1/2 tab twice daily, and Toprol -XL 25 mg 1/2 tab daily - Continue 81 mg aspirin  along with 40 mg rosuvastatin .-  - Encourage stress management. - Order lab tests for cholesterol and liver function.

## 2024-05-08 NOTE — Assessment & Plan Note (Addendum)
 Due for labs to be checked soon.  Excellent control as of last year. Will switch from atorvastatin  to rosuvastatin  at last visit.  -Continue statin with PCP monitoring for lipids.

## 2024-06-03 ENCOUNTER — Other Ambulatory Visit: Payer: Self-pay | Admitting: Nurse Practitioner

## 2024-08-15 ENCOUNTER — Other Ambulatory Visit: Payer: Self-pay | Admitting: Cardiology

## 2024-08-17 NOTE — Telephone Encounter (Signed)
 Pt of Dr. Anner. Does Dr. Anner want to refill this Non-Cardiac RX? Please advise.

## 2024-09-12 ENCOUNTER — Other Ambulatory Visit: Payer: Self-pay | Admitting: Nurse Practitioner
